# Patient Record
Sex: Male | Born: 1950
Health system: Southern US, Community
[De-identification: ages and names within clinical notes are randomized; demographics above are authoritative.]

## PROBLEM LIST (undated history)

## (undated) DIAGNOSIS — L301 Dyshidrosis [pompholyx]: Secondary | ICD-10-CM

## (undated) DIAGNOSIS — Z8719 Personal history of other diseases of the digestive system: Secondary | ICD-10-CM

## (undated) DIAGNOSIS — T753XXA Motion sickness, initial encounter: Secondary | ICD-10-CM

## (undated) DIAGNOSIS — N183 Chronic kidney disease, stage 3 unspecified: Secondary | ICD-10-CM

## (undated) DIAGNOSIS — N189 Chronic kidney disease, unspecified: Secondary | ICD-10-CM

## (undated) DIAGNOSIS — Z972 Presence of dental prosthetic device (complete) (partial): Secondary | ICD-10-CM

## (undated) DIAGNOSIS — B0229 Other postherpetic nervous system involvement: Secondary | ICD-10-CM

## (undated) DIAGNOSIS — K7689 Other specified diseases of liver: Secondary | ICD-10-CM

## (undated) DIAGNOSIS — I10 Essential (primary) hypertension: Secondary | ICD-10-CM

## (undated) DIAGNOSIS — H269 Unspecified cataract: Secondary | ICD-10-CM

## (undated) DIAGNOSIS — I25110 Atherosclerotic heart disease of native coronary artery with unstable angina pectoris: Secondary | ICD-10-CM

## (undated) DIAGNOSIS — Z87442 Personal history of urinary calculi: Secondary | ICD-10-CM

## (undated) DIAGNOSIS — R7303 Prediabetes: Secondary | ICD-10-CM

## (undated) DIAGNOSIS — N400 Enlarged prostate without lower urinary tract symptoms: Secondary | ICD-10-CM

## (undated) DIAGNOSIS — I340 Nonrheumatic mitral (valve) insufficiency: Secondary | ICD-10-CM

## (undated) DIAGNOSIS — I700 Atherosclerosis of aorta: Secondary | ICD-10-CM

## (undated) DIAGNOSIS — I5189 Other ill-defined heart diseases: Secondary | ICD-10-CM

## (undated) DIAGNOSIS — R001 Bradycardia, unspecified: Secondary | ICD-10-CM

## (undated) DIAGNOSIS — M109 Gout, unspecified: Secondary | ICD-10-CM

## (undated) DIAGNOSIS — C4490 Unspecified malignant neoplasm of skin, unspecified: Secondary | ICD-10-CM

## (undated) DIAGNOSIS — I351 Nonrheumatic aortic (valve) insufficiency: Secondary | ICD-10-CM

## (undated) DIAGNOSIS — K469 Unspecified abdominal hernia without obstruction or gangrene: Secondary | ICD-10-CM

## (undated) DIAGNOSIS — K5730 Diverticulosis of large intestine without perforation or abscess without bleeding: Secondary | ICD-10-CM

## (undated) DIAGNOSIS — K219 Gastro-esophageal reflux disease without esophagitis: Secondary | ICD-10-CM

## (undated) DIAGNOSIS — Z955 Presence of coronary angioplasty implant and graft: Secondary | ICD-10-CM

## (undated) DIAGNOSIS — E785 Hyperlipidemia, unspecified: Secondary | ICD-10-CM

## (undated) DIAGNOSIS — I4510 Unspecified right bundle-branch block: Secondary | ICD-10-CM

## (undated) DIAGNOSIS — I2510 Atherosclerotic heart disease of native coronary artery without angina pectoris: Secondary | ICD-10-CM

## (undated) DIAGNOSIS — N200 Calculus of kidney: Secondary | ICD-10-CM

## (undated) DIAGNOSIS — I219 Acute myocardial infarction, unspecified: Secondary | ICD-10-CM

## (undated) HISTORY — DX: Unspecified cataract: H26.9

## (undated) HISTORY — DX: Calculus of kidney: N20.0

## (undated) HISTORY — DX: Hyperlipidemia, unspecified: E78.5

## (undated) HISTORY — DX: Chronic kidney disease, stage 3 unspecified: N18.30

## (undated) HISTORY — DX: Atherosclerotic heart disease of native coronary artery without angina pectoris: I25.10

## (undated) HISTORY — DX: Unspecified right bundle-branch block: I45.10

## (undated) HISTORY — DX: Other ill-defined heart diseases: I51.89

## (undated) HISTORY — DX: Essential (primary) hypertension: I10

## (undated) HISTORY — DX: Unspecified abdominal hernia without obstruction or gangrene: K46.9

## (undated) HISTORY — DX: Gastro-esophageal reflux disease without esophagitis: K21.9

## (undated) HISTORY — PX: HERNIA REPAIR: SHX51

## (undated) HISTORY — DX: Chronic kidney disease, stage 3 (moderate): N18.3

---

## 2011-03-08 DIAGNOSIS — I219 Acute myocardial infarction, unspecified: Secondary | ICD-10-CM

## 2011-03-08 HISTORY — PX: CORONARY ANGIOPLASTY WITH STENT PLACEMENT: SHX49

## 2011-03-08 HISTORY — DX: Acute myocardial infarction, unspecified: I21.9

## 2011-03-08 HISTORY — PX: COLONOSCOPY: SHX174

## 2011-04-08 DIAGNOSIS — I2119 ST elevation (STEMI) myocardial infarction involving other coronary artery of inferior wall: Secondary | ICD-10-CM

## 2011-04-08 HISTORY — DX: ST elevation (STEMI) myocardial infarction involving other coronary artery of inferior wall: I21.19

## 2012-03-07 HISTORY — PX: LITHOTRIPSY: SUR834

## 2013-11-05 DIAGNOSIS — M109 Gout, unspecified: Secondary | ICD-10-CM | POA: Insufficient documentation

## 2013-11-05 DIAGNOSIS — M546 Pain in thoracic spine: Secondary | ICD-10-CM | POA: Insufficient documentation

## 2014-09-04 DIAGNOSIS — I10 Essential (primary) hypertension: Secondary | ICD-10-CM | POA: Insufficient documentation

## 2014-09-04 DIAGNOSIS — E782 Mixed hyperlipidemia: Secondary | ICD-10-CM | POA: Insufficient documentation

## 2015-02-23 ENCOUNTER — Encounter: Payer: Self-pay | Admitting: Emergency Medicine

## 2015-02-23 ENCOUNTER — Emergency Department
Admission: EM | Admit: 2015-02-23 | Discharge: 2015-02-23 | Disposition: A | Discharge status: Home / Self Care | Payer: Self-pay | Attending: Emergency Medicine | Admitting: Emergency Medicine

## 2015-02-23 DIAGNOSIS — T18108A Unspecified foreign body in esophagus causing other injury, initial encounter: Secondary | ICD-10-CM

## 2015-02-23 DIAGNOSIS — Y9289 Other specified places as the place of occurrence of the external cause: Secondary | ICD-10-CM | POA: Insufficient documentation

## 2015-02-23 DIAGNOSIS — T18128A Food in esophagus causing other injury, initial encounter: Secondary | ICD-10-CM | POA: Insufficient documentation

## 2015-02-23 DIAGNOSIS — X58XXXA Exposure to other specified factors, initial encounter: Secondary | ICD-10-CM | POA: Insufficient documentation

## 2015-02-23 DIAGNOSIS — Y998 Other external cause status: Secondary | ICD-10-CM | POA: Insufficient documentation

## 2015-02-23 DIAGNOSIS — Y9389 Activity, other specified: Secondary | ICD-10-CM | POA: Insufficient documentation

## 2015-02-23 HISTORY — DX: Acute myocardial infarction, unspecified: I21.9

## 2015-02-23 MED ORDER — ONDANSETRON HCL 4 MG/2ML IJ SOLN
4.0000 mg | Freq: Once | INTRAMUSCULAR | Status: AC
  Administered 2015-02-23: 4 mg via INTRAVENOUS
  Filled 2015-02-23: qty 2

## 2015-02-23 MED ORDER — GLUCAGON HCL (RDNA) 1 MG IJ SOLR
1.0000 mg | Freq: Once | INTRAMUSCULAR | Status: AC
  Administered 2015-02-23: 1 mg via INTRAVENOUS
  Filled 2015-02-23 (×2): qty 1

## 2015-02-23 NOTE — ED Notes (Signed)
Reports getting a piece of chicken is stuck in his throat.  Pt able to talk well but frquent spitting due to difficulty swallowing

## 2015-02-23 NOTE — ED Provider Notes (Addendum)
[facility] Emergency Department Provider Note  ____________________________________________   I have reviewed the triage vital signs and the nursing notes.   HISTORY  Chief Complaint Choking    HPI Kevin Melton is a 64 y.o. male with a history of meat impaction in the past was eating a piece of chicken, he felt it go down and get stuck in his throat. He has mild discomfort in that exact spot in the lower chest, he states when he swallows his saliva it comes right back up. He has never had an endoscopy or further workup for this but he does eat small pieces usually because he notes that this can happen to him. The last time this happened he had glucagon and felt better.  Past Medical History  Diagnosis Date  . MI (myocardial infarction) ([place])     There are no active problems to display for this patient.   Past Surgical History  Procedure Laterality Date  . Coronary angioplasty with stent placement      No current outpatient prescriptions on file.  Allergies Review of patient's allergies indicates no known allergies.  History reviewed. No pertinent family history.  Social History Social History  Substance Use Topics  . Smoking status: Never Smoker   . Smokeless tobacco: None  . Alcohol Use: None    Review of Systems Constitutional: No fever/chills Eyes: No visual changes. ENT: No sore throat. No stiff neck no neck pain Cardiovascular: Denies chest pain. see history of present illness Respiratory: Denies shortness of breath. Gastrointestinal:   no vomiting.  No diarrhea.  No constipation. Genitourinary: Negative for dysuria. Musculoskeletal: Negative lower extremity swelling Skin: Negative for rash. Neurological: Negative for headaches, focal weakness or numbness. 10-point ROS otherwise negative.  ____________________________________________   PHYSICAL EXAM:  VITAL SIGNS: ED Triage Vitals  Enc Vitals Group     BP --       Pulse Rate 02/23/15 1854 51     Resp 02/23/15 1854 20     Temp 02/23/15 1854 98 F (36.7 C)     Temp Source 02/23/15 1854 Oral     SpO2 02/23/15 1854 98 %     Weight 02/23/15 1854 195 lb (88.451 kg)     Height 02/23/15 1854 5\' 9"  (1.753 m)     Head Cir --      Peak Flow --      Pain Score --      Pain Loc --      Pain Edu? --      Excl. in [place]? --     Constitutional: Alert and oriented. Well appearing and in no acute distress. Eyes: Conjunctivae are normal. PERRL. EOMI. Head: Atraumatic. Nose: No congestion/rhinnorhea. Mouth/Throat: Mucous membranes are moist.  Oropharynx non-erythematous. Neck: No stridor.   Nontender with no meningismus Cardiovascular: Normal rate, regular rhythm. Grossly normal heart sounds.  Good peripheral circulation. Respiratory: Normal respiratory effort.  No retractions. Lungs CTAB. Abdominal: Soft and nontender. No distention. No guarding no rebound Back:  There is no focal tenderness or step off there is no midline tenderness there are no lesions noted. there is no CVA tenderness Musculoskeletal: No lower extremity tenderness. No joint effusions, no DVT signs strong distal pulses no edema Neurologic:  Normal speech and language. No gross focal neurologic deficits are appreciated.  Skin:  Skin is warm, dry and intact. No rash noted. Psychiatric: Mood and affect are normal. Speech and behavior are normal.  ____________________________________________   LABS (all labs ordered  are listed, but only abnormal results are displayed)  Labs Reviewed - No data to display ____________________________________________  EKG  I personally interpreted any EKGs ordered by me or triage Sinus bradycardia and no acute ST elevation or depression noted acute ST changes ____________________________________________  RADIOLOGY  I reviewed any imaging ordered by me or triage that were performed during my  shift ____________________________________________   PROCEDURES  Procedure(s) performed: None  Critical Care performed: None  ____________________________________________   INITIAL IMPRESSION / ASSESSMENT AND PLAN / ED COURSE  Pertinent labs & imaging results that were available during my care of the patient were reviewed by me and considered in my medical decision making (see chart for details).  The patient presents today with a meat impaction he is very well-appearing. Do not feel this likely represents PE ACS or dissection or other intrathoracic pathology. No evidence of rupture. We will give him glucagon and see if this helps him feel better. ____________________________________________  ----------------------------------------- 7:54 PM on 02/23/2015 -----------------------------------------  Discussed with on-call GI Dr. Gustavo Lah who graciously agrees to consult, he agrees with plan and management and will check back to see if the glucagon functions.  ----------------------------------------- 9:17 PM on 02/23/2015 -----------------------------------------  Patient states he feels 100% better and that the impaction has gone he felt that slide into his stomach he is able to drink water he is handling his secretions. I'm giving him some crackers and if he can tolerate that we will discharge him.   FINAL CLINICAL IMPRESSION(S) / ED DIAGNOSES  Final diagnoses:  None     Schuyler Amor, MD 02/23/15 [place], MD 02/23/15 [place], MD 02/23/15 [id number]  Schuyler Amor, MD 02/23/15 [id number]

## 2016-11-10 ENCOUNTER — Encounter: Payer: Self-pay | Admitting: Family Medicine

## 2016-11-10 ENCOUNTER — Ambulatory Visit (INDEPENDENT_AMBULATORY_CARE_PROVIDER_SITE_OTHER): Payer: PPO | Admitting: Family Medicine

## 2016-11-10 VITALS — BP 156/74 | HR 68 | Temp 98.0°F | Resp 16 | Ht 69.0 in | Wt 205.0 lb

## 2016-11-10 DIAGNOSIS — R1310 Dysphagia, unspecified: Secondary | ICD-10-CM

## 2016-11-10 DIAGNOSIS — Z23 Encounter for immunization: Secondary | ICD-10-CM | POA: Diagnosis not present

## 2016-11-10 DIAGNOSIS — K429 Umbilical hernia without obstruction or gangrene: Secondary | ICD-10-CM

## 2016-11-10 DIAGNOSIS — Z1159 Encounter for screening for other viral diseases: Secondary | ICD-10-CM | POA: Diagnosis not present

## 2016-11-10 DIAGNOSIS — I252 Old myocardial infarction: Secondary | ICD-10-CM | POA: Diagnosis not present

## 2016-11-10 DIAGNOSIS — Z114 Encounter for screening for human immunodeficiency virus [HIV]: Secondary | ICD-10-CM | POA: Diagnosis not present

## 2016-11-10 DIAGNOSIS — Z7689 Persons encountering health services in other specified circumstances: Secondary | ICD-10-CM | POA: Diagnosis not present

## 2016-11-10 NOTE — Patient Instructions (Signed)

## 2016-11-10 NOTE — Addendum Note (Signed)
Addended by: Brennan Bailey on: 11/10/2016 04:59 PM   Modules accepted: Orders

## 2016-11-10 NOTE — Assessment & Plan Note (Signed)
Not taking BB, but states he did not tolerate low dose in the past due to bradycardia Is hypertensive today, but thinks this is related to anxiety over establishing care as it is usually well controlled at home No medication changes today Check CMP, CBC, lipid panel Referral to Cardiology Recheck BP at next visit

## 2016-11-10 NOTE — Progress Notes (Signed)
Name: Kevin Melton   MRN: [id number]    DOB: 04-21-1950   Date:11/10/2016       Progress Note  Subjective  Chief Complaint  Chief Complaint  Patient presents with  . New Patient (Initial Visit)    HPI  Kevin Melton presents today to establish care. He reports he has not received care in the last 5 years, and he needs a new a PCP because he is now on Medicare. He needs to discuss 2 problems, including a hernia and dysphagia. Pt reports the hernia is located on the umbilicus for the last 4-5 years, and is increasing in size. Pt denies pain.  Wonders if this is related to his GERD and dysphagia  He is also c/o "food getting stuck in my throat" for about 2 years now. He admits to acid reflux. He states he has several family members who had to get esophagus stretched, including his mother, sister and brother. Has been seen in the emergency room for this previously.  Cuts his food into very small pieces to avoid this, but still has some pain with swallowing.  Takes ranitidine prn, not daily.   H/o MI: in 2013, s/p 1 stent placement - not taking a beta blocker due to bradycardia - is taking a baby aspirin daily and statin and ACEi - no CP, SOB, exercise restriction    H/O acute myocardial infarction Not taking BB, but states he did not tolerate low dose in the past due to bradycardia Is hypertensive today, but thinks this is related to anxiety over establishing care as it is usually well controlled at home No medication changes today Check CMP, CBC, lipid panel Referral to Cardiology Recheck BP at next visit  Dysphagia Concerning that patient needs EGD and possible esophageal dilation Referral to GI for management  Umbilical hernia Small and easily reducible Referral to gen surg to discuss options for possible surgery Discussed warning signs for obstruction and strangulation that would require emergent medical attention   Past Medical History:  Diagnosis Date  . Kidney  stones   . MI (myocardial infarction) ([place]) 2013   1 stent placed    Past Surgical History:  Procedure Laterality Date  . CORONARY ANGIOPLASTY WITH STENT PLACEMENT  2013  . LITHOTRIPSY  2014    Family History  Problem Relation Age of Onset  . Dementia Mother 18  . Lung disease Father 69       collapsed lung  . Healthy Sister   . Healthy Brother   . Healthy Son   . Healthy Sister   . Colon cancer Neg Hx   . Prostate cancer Neg Hx     Social History   Social History  . Marital status: Married    Spouse name: [name]  . Number of children: 1  . Years of education: 41   Occupational History  . Self Employed     [employer] [place] History Main Topics  . Smoking status: Never Smoker  . Smokeless tobacco: Never Used  . Alcohol use No  . Drug use: No  . Sexual activity: Yes    Partners: Female   Other Topics Concern  . Not on file   Social History Narrative  . No narrative on file     Current Outpatient Prescriptions:  .  allopurinol (ZYLOPRIM) 300 MG tablet, Take by mouth., Disp: , Rfl:  .  aspirin 81 MG chewable tablet, Chew by mouth., Disp: , Rfl:  .  benazepril (LOTENSIN) 5 MG tablet, Take by mouth., Disp: , Rfl:  .  nitroGLYCERIN (NITROSTAT) 0.4 MG SL tablet, Place 1 tablet under the tongue as needed., Disp: , Rfl:  .  pravastatin (PRAVACHOL) 40 MG tablet, Take by mouth., Disp: , Rfl:   No Known Allergies   Review of Systems  Constitutional: Negative.   HENT: Negative.   Eyes: Negative.   Respiratory: Negative.   Cardiovascular: Negative.   Gastrointestinal: Negative.   Genitourinary: Negative.   Musculoskeletal: Negative.   Skin: Negative.   Neurological: Negative.   Endo/Heme/Allergies: Negative.   Psychiatric/Behavioral: Negative.     Objective  Vitals:   11/10/16 1524  BP: (!) 156/74  Pulse: 68  Resp: 16  Temp: 98 F (36.7 C)  TempSrc: Oral  Weight: 205 lb (93 kg)  Height: 5\' 9"  (1.753 m)    Physical Exam   Constitutional: He is oriented to person, place, and time and well-developed, well-nourished, and in no distress.  HENT:  Head: Normocephalic and atraumatic.  Right Ear: External ear normal.  Left Ear: External ear normal.  Nose: Nose normal.  Mouth/Throat: Oropharynx is clear and moist.  Eyes: Pupils are equal, round, and reactive to light. Conjunctivae are normal. No scleral icterus.  Neck: Neck supple. No thyromegaly present.  Cardiovascular: Normal rate, regular rhythm, normal heart sounds and intact distal pulses.   No murmur heard. Pulmonary/Chest: Effort normal and breath sounds normal. No respiratory distress. He has no wheezes. He has no rales.  Abdominal: Bowel sounds are normal. He exhibits no distension. There is no tenderness. There is no rebound and no guarding.  + umbilical hernia, easily reducible  Musculoskeletal: He exhibits no edema or deformity.  Lymphadenopathy:    He has no cervical adenopathy.  Neurological: He is alert and oriented to person, place, and time. Gait normal.  Skin: Skin is warm and dry. No rash noted.  Psychiatric: Affect and judgment normal.  Vitals reviewed.    No results found for this or any previous visit (from the past 2160 hour(s)).   Assessment & Plan  Problem List Items Addressed This Visit      Digestive   Dysphagia    Concerning that patient needs EGD and possible esophageal dilation Referral to GI for management      Relevant Orders   Ambulatory referral to Gastroenterology     Other   H/O acute myocardial infarction    Not taking BB, but states he did not tolerate low dose in the past due to bradycardia Is hypertensive today, but thinks this is related to anxiety over establishing care as it is usually well controlled at home No medication changes today Check CMP, CBC, lipid panel Referral to Cardiology Recheck BP at next visit      Relevant Orders   Lipid panel   Comprehensive metabolic panel   CBC  w/Diff/Platelet   Ambulatory referral to Cardiology   Umbilical hernia    Small and easily reducible Referral to gen surg to discuss options for possible surgery Discussed warning signs for obstruction and strangulation that would require emergent medical attention      Relevant Orders   Ambulatory referral to General Surgery    Other Visit Diagnoses    Encounter to establish care    -  Primary   Need for hepatitis C screening test       Relevant Orders   Hepatitis C Antibody   Screening for HIV (human immunodeficiency virus)  Relevant Orders   HIV antibody (with reflex)     Flu shot and pneumovax given today  Meds ordered this encounter  Medications  . benazepril (LOTENSIN) 5 MG tablet    Sig: Take by mouth.  . pravastatin (PRAVACHOL) 40 MG tablet    Sig: Take by mouth.  Marland Kitchen aspirin 81 MG chewable tablet    Sig: Chew by mouth.  Marland Kitchen allopurinol (ZYLOPRIM) 300 MG tablet    Sig: Take by mouth.  . nitroGLYCERIN (NITROSTAT) 0.4 MG SL tablet    Sig: Place 1 tablet under the tongue as needed.    Return in about 3 months (around 02/09/2017) for welcome to medicare.   The entirety of the information documented in the History of Present Illness, Review of Systems and Physical Exam were personally obtained by me. Portions of this information were initially documented by Joseline Rosas and Raquel Sarna Ratchford, CMA and reviewed by me for thoroughness and accuracy.    Virginia Crews, MD, MPH [facility] 11/10/2016 4:23 PM

## 2016-11-10 NOTE — Assessment & Plan Note (Signed)
Concerning that patient needs EGD and possible esophageal dilation Referral to GI for management

## 2016-11-10 NOTE — Assessment & Plan Note (Signed)
Small and easily reducible Referral to gen surg to discuss options for possible surgery Discussed warning signs for obstruction and strangulation that would require emergent medical attention

## 2016-11-11 ENCOUNTER — Encounter: Payer: Self-pay | Admitting: General Surgery

## 2016-11-11 LAB — COMPREHENSIVE METABOLIC PANEL
AG RATIO: 1.4 (calc) (ref 1.0–2.5)
ALT: 18 U/L (ref 9–46)
AST: 14 U/L (ref 10–35)
Albumin: 4.2 g/dL (ref 3.6–5.1)
Alkaline phosphatase (APISO): 56 U/L (ref 40–115)
BILIRUBIN TOTAL: 0.4 mg/dL (ref 0.2–1.2)
BUN / CREAT RATIO: 11 (calc) (ref 6–22)
BUN: 16 mg/dL (ref 7–25)
CALCIUM: 9.5 mg/dL (ref 8.6–10.3)
CHLORIDE: 105 mmol/L (ref 98–110)
CO2: 27 mmol/L (ref 20–32)
Creat: 1.44 mg/dL — ABNORMAL HIGH (ref 0.70–1.25)
GLOBULIN: 3 g/dL (ref 1.9–3.7)
GLUCOSE: 103 mg/dL (ref 65–139)
Potassium: 4.6 mmol/L (ref 3.5–5.3)
SODIUM: 139 mmol/L (ref 135–146)
TOTAL PROTEIN: 7.2 g/dL (ref 6.1–8.1)

## 2016-11-11 LAB — LIPID PANEL
CHOLESTEROL: 159 mg/dL (ref <200)
HDL: 39 mg/dL — ABNORMAL LOW (ref >40)
LDL Cholesterol (Calc): 90 mg/dL (calc)
Non-HDL Cholesterol (Calc): 120 mg/dL (calc) (ref <130)
TRIGLYCERIDES: 199 mg/dL — AB (ref <150)
Total CHOL/HDL Ratio: 4.1 (calc) (ref <5.0)

## 2016-11-11 LAB — CBC WITH DIFFERENTIAL/PLATELET
BASOS ABS: 67 {cells}/uL (ref 0–200)
Basophils Relative: 0.7 %
EOS ABS: 624 {cells}/uL — AB (ref 15–500)
Eosinophils Relative: 6.5 %
HCT: 47.3 % (ref 38.5–50.0)
Hemoglobin: 16.2 g/dL (ref 13.2–17.1)
Lymphs Abs: 2477 cells/uL (ref 850–3900)
MCH: 30.1 pg (ref 27.0–33.0)
MCHC: 34.2 g/dL (ref 32.0–36.0)
MCV: 87.8 fL (ref 80.0–100.0)
MONOS PCT: 9.5 %
MPV: 11.4 fL (ref 7.5–12.5)
Neutro Abs: 5520 cells/uL (ref 1500–7800)
Neutrophils Relative %: 57.5 %
PLATELETS: 225 10*3/uL (ref 140–400)
RBC: 5.39 10*6/uL (ref 4.20–5.80)
RDW: 13.5 % (ref 11.0–15.0)
TOTAL LYMPHOCYTE: 25.8 %
WBC mixed population: 912 cells/uL (ref 200–950)
WBC: 9.6 10*3/uL (ref 3.8–10.8)

## 2016-11-11 LAB — HEPATITIS C ANTIBODY
Hepatitis C Ab: NONREACTIVE
SIGNAL TO CUT-OFF: 0.04 (ref <1.00)

## 2016-11-11 LAB — HIV ANTIBODY (ROUTINE TESTING W REFLEX): HIV 1&2 Ab, 4th Generation: NONREACTIVE

## 2016-11-14 ENCOUNTER — Telehealth: Payer: Self-pay

## 2016-11-14 DIAGNOSIS — N289 Disorder of kidney and ureter, unspecified: Secondary | ICD-10-CM

## 2016-11-14 NOTE — Telephone Encounter (Signed)
-----   Message from Virginia Crews, MD sent at 11/11/2016  3:37 PM EDT ----- Cholesterol is good.  Good cholesterol is low and exercise can improve this.  Normal liver function, electrolytes.  HIV and Hep C screen negative.  Creatinine (a marker of kidney function) is high.  This could be a chronic problem but we have no labs to compare to.  This can sometimes be seen from drugs like Benazepril.  I recommend holding this medication for 2 weeks and then having the kidney function rechecked (please order renal function panel with GFR for 2 weeks from now).  Blood counts are normal, but one type of WBCs is high (eosinophils).  This can be seen in some infections and will often resolve itself. We will recheck in 3 months.  Virginia Crews, MD, MPH [facility] 11/11/2016 3:37 PM

## 2016-11-14 NOTE — Telephone Encounter (Signed)
lmtcb

## 2016-11-15 ENCOUNTER — Encounter: Payer: Self-pay | Admitting: *Deleted

## 2016-11-21 ENCOUNTER — Ambulatory Visit: Payer: Self-pay | Admitting: Gastroenterology

## 2016-11-21 ENCOUNTER — Ambulatory Visit: Payer: Self-pay | Admitting: General Surgery

## 2016-11-22 NOTE — Telephone Encounter (Signed)
lmtcb-Calinda Stockinger V Manasvi Dickard, RMA  

## 2016-11-24 NOTE — Telephone Encounter (Signed)
Pt advised. He states he has had problems with his kidneys in the past. He was once told that "one kidney is working at 70% and the other kidney is at 30%". He had issues with kidney stones in the past, as well as a kidney infection as a child. Advised to D/C medication for 2 weeks the recheck labs to see if kidney function is unchanged. He agrees with treatment plan.

## 2016-12-13 DIAGNOSIS — N289 Disorder of kidney and ureter, unspecified: Secondary | ICD-10-CM | POA: Diagnosis not present

## 2016-12-13 LAB — RENAL PROFILE WITH ESTIMATED GFR
ALBUMIN MSPROF: 4.1 g/dL (ref 3.6–5.1)
BUN / CREAT RATIO: 14 (calc) (ref 6–22)
BUN: 19 mg/dL (ref 7–25)
CHLORIDE: 106 mmol/L (ref 98–110)
CO2: 26 mmol/L (ref 20–32)
Calcium: 9.1 mg/dL (ref 8.6–10.3)
Creat: 1.36 mg/dL — ABNORMAL HIGH (ref 0.70–1.25)
GFR, EST AFRICAN AMERICAN: 63 mL/min/{1.73_m2} (ref >=60)
GFR, Est Non African American: 54 mL/min/{1.73_m2} — ABNORMAL LOW (ref >=60)
Glucose, Bld: 83 mg/dL (ref 65–99)
Phosphorus: 3.1 mg/dL (ref 2.1–4.3)
Potassium: 4.2 mmol/L (ref 3.5–5.3)
Sodium: 140 mmol/L (ref 135–146)

## 2016-12-14 ENCOUNTER — Telehealth: Payer: Self-pay

## 2016-12-14 NOTE — Telephone Encounter (Signed)
-----   Message from Virginia Crews, MD sent at 12/14/2016  9:34 AM EDT ----- Creatinine (kidney function) is only very slightly better.  It is likely that this is a chronic problem.  Can resume benazepril.  Be sure to stay hydrated.  We will check again at next follow-up.  Virginia Crews, MD, MPH [facility] 12/14/2016 9:34 AM

## 2016-12-14 NOTE — Telephone Encounter (Signed)
lmtcb

## 2016-12-20 NOTE — Telephone Encounter (Signed)
Called but not able to leave a message due to mail box is full.-Trevia Nop V Cledis Sohn, RMA

## 2016-12-22 ENCOUNTER — Telehealth: Payer: Self-pay

## 2016-12-22 ENCOUNTER — Encounter: Payer: Self-pay | Admitting: Gastroenterology

## 2016-12-22 ENCOUNTER — Ambulatory Visit (INDEPENDENT_AMBULATORY_CARE_PROVIDER_SITE_OTHER): Payer: PPO | Admitting: Gastroenterology

## 2016-12-22 ENCOUNTER — Other Ambulatory Visit: Payer: Self-pay

## 2016-12-22 ENCOUNTER — Encounter (INDEPENDENT_AMBULATORY_CARE_PROVIDER_SITE_OTHER): Payer: Self-pay

## 2016-12-22 VITALS — BP 148/80 | HR 71 | Temp 98.2°F | Ht 69.0 in | Wt 202.8 lb

## 2016-12-22 DIAGNOSIS — R1319 Other dysphagia: Secondary | ICD-10-CM

## 2016-12-22 DIAGNOSIS — K219 Gastro-esophageal reflux disease without esophagitis: Secondary | ICD-10-CM

## 2016-12-22 DIAGNOSIS — R1310 Dysphagia, unspecified: Secondary | ICD-10-CM

## 2016-12-22 DIAGNOSIS — R12 Heartburn: Secondary | ICD-10-CM | POA: Diagnosis not present

## 2016-12-22 MED ORDER — RANITIDINE HCL 150 MG PO CAPS: 150.0000 mg | ORAL_CAPSULE | Freq: Two times a day (BID) | ORAL | 0 refills | Status: DC

## 2016-12-22 NOTE — Telephone Encounter (Signed)
    Patient has been informed of rx for Zantac and informed him its cheaper compared to over-the-counter  He's at pharmacy getting it now and appreciates the call. Thanks [employer]

## 2016-12-22 NOTE — Progress Notes (Signed)
Kevin Darby, MD [address]  [place]  [place], [place] [address]  Main: [phone]  Fax: [phone]    Gastroenterology Consultation  Referring Provider:     Virginia Crews, MD Primary Care Physician:  Virginia Crews, MD Primary Gastroenterologist:  Dr. Cephas Melton Reason for Consultation:     Chronic dysphagia        HPI:   Kevin Melton is a 66 y.o. male referred by Dr. Brita Romp, Dionne Bucy, MD  for consultation & management of chronic dysphagia to solids. He experienced food impaction in 2016, went to ER, food bolus spontaneously passed after glucagon and soda.  Another epsidoe 43months later, went to ER again Did not have EGD during both episodes. Since then, he stayed away from hard foods due to dysphagia He is very careful of what he is eating, did not see GI until now due to lack of insurance Currently, having nocturnal heart burn if he has late dinner Currently not on PPI or H2 blocker He has mildly elevated serum creatinine, was thought to be secondary to ACE inhibitor and also, he had history of kidney stones. We do not have his baseline creatinine He does not smoke, or drink alcohol, denies NSAID use He is a Geophysicist/field seismologist by profession He denies any family history of GI malignancies GI Procedures: had a colonoscopy at age 44 and at age 62, reportedly normal  Past Medical History:  Diagnosis Date  . Abdominal hernia    umbilical  . Kidney stones   . MI (myocardial infarction) ([place]) 2013   1 stent placed    Past Surgical History:  Procedure Laterality Date  . CORONARY ANGIOPLASTY WITH STENT PLACEMENT  2013  . LITHOTRIPSY  2014    Prior to Admission medications   Medication Sig Start Date End Date Taking? Authorizing Provider  allopurinol (ZYLOPRIM) 300 MG tablet Take by mouth. 03/21/16  Yes [provider]  aspirin 81 MG chewable tablet Chew by mouth.   Yes [provider]  pravastatin (PRAVACHOL) 40 MG  tablet Take by mouth. 03/21/16  Yes [provider]    Family History  Problem Relation Age of Onset  . Dementia Mother 40  . Lung disease Father 83       collapsed lung  . Healthy Sister   . Healthy Brother   . Healthy Son   . Healthy Sister   . Colon cancer Neg Hx   . Prostate cancer Neg Hx      Social History  Substance Use Topics  . Smoking status: Never Smoker  . Smokeless tobacco: Never Used  . Alcohol use No    Allergies as of 12/22/2016  . (No Known Allergies)    Review of Systems:    All systems reviewed and negative except where noted in HPI.   Physical Exam:  BP (!) 148/80   Pulse 71   Temp 98.2 F (36.8 C) (Oral)   Ht 5\' 9"  (1.753 m)   Wt 202 lb 12.8 oz (92 kg)   BMI 29.95 kg/m  No LMP for male patient.  General:   Alert,  Well-developed, well-nourished, pleasant and cooperative in NAD Head:  Normocephalic and atraumatic. Eyes:  Sclera clear, no icterus.   Conjunctiva pink. Ears:  Normal auditory acuity. Nose:  No deformity, discharge, or lesions. Mouth:  No deformity or lesions,oropharynx pink & moist. Neck:  Supple; no masses or thyromegaly. Lungs:  Respirations even and unlabored.  Clear throughout to  auscultation.   No wheezes, crackles, or rhonchi. No acute distress. Heart:  Regular rate and rhythm; no murmurs, clicks, rubs, or gallops. Abdomen:  Normal bowel sounds. Soft, obese,non-tender and non-distended,   Medium size ventral hernia and umbilical hernia, nontender. No hepatosplenomegaly noted.  No guarding or rebound tenderness.   Rectal: Nor performed Msk:  Symmetrical without gross deformities. Good, equal movement & strength bilaterally. Pulses:  Normal pulses noted. Extremities:  No clubbing or edema.  No cyanosis. Neurologic:  Alert and oriented x3;  grossly normal neurologically. Skin:  Intact without significant lesions or rashes. No jaundice. Lymph Nodes:  No significant cervical adenopathy. Psych:  Alert and cooperative.  Normal mood and affect.  Imaging Studies: none  Assessment and Plan:   Kevin Melton is a 66 y.o. male with chronic dysphagia to solids, prior episodes of food impactions which spontaneously passed. Never had an EGD before. He also has Periumbilical and ventral hernias  Chronic dysphagia: peptic stricture or Schatzki's ring or malignancy or achalasia or esophageal web EGD with or without dilation I have discussed alternative options, risks & benefits,  which include, but are not limited to, bleeding, infection, perforation,respiratory complication & drug reaction.  The patient agrees with this plan & written consent will be obtained.    Given his elevated serum creatinine, recommend H2 blocker 1-2 times daily or at bedtime  He is also referred to general surgery for evaluation of hernia repair  Colonoscopy at age 52  Follow up after EGD   Kevin Darby, MD

## 2016-12-22 NOTE — Telephone Encounter (Signed)
-----   Message from Lin Landsman, MD sent at 12/22/2016  4:03 PM EDT ----- Regarding: prescription Please let him know that I sent a prescription for Zantac as it is cheaper been prescribed compared to over-the-counter  Thnx RV

## 2016-12-27 ENCOUNTER — Encounter: Payer: Self-pay | Admitting: *Deleted

## 2016-12-27 NOTE — Telephone Encounter (Signed)
lmtcb

## 2016-12-27 NOTE — Telephone Encounter (Signed)
Pt advised and agrees with treatment plan. 

## 2016-12-27 NOTE — Telephone Encounter (Signed)
Pt stated he was returning nurse call about his lab results. Please advise. Thanks TNP

## 2017-01-04 ENCOUNTER — Ambulatory Visit (INDEPENDENT_AMBULATORY_CARE_PROVIDER_SITE_OTHER): Payer: PPO | Admitting: General Surgery

## 2017-01-04 ENCOUNTER — Encounter: Payer: Self-pay | Admitting: General Surgery

## 2017-01-04 VITALS — BP 132/84 | HR 69 | Ht 69.0 in | Wt 205.0 lb

## 2017-01-04 DIAGNOSIS — K429 Umbilical hernia without obstruction or gangrene: Secondary | ICD-10-CM

## 2017-01-04 NOTE — Patient Instructions (Signed)

## 2017-01-04 NOTE — Progress Notes (Signed)
Patient ID: Kevin Melton, male   DOB: [birth year], 66 y.o.   MRN: [id number]  Chief Complaint  Patient presents with  . Umbilical Hernia    HPI Kevin Melton is a 66 y.o. male.  Here for evaluation of an umbilical hernia referred by Dr Rene Kocher. He states he noticed an umbilical bulge for at least 4-5 years. He states it maybe getting some larger, but no pain. He is a Scientist, forensic. He states with long days carrying the heavy camera he might notice some discomfort.  He is here with his wife of 11 years, Kevin Melton.  HPI  Past Medical History:  Diagnosis Date  . Abdominal hernia    umbilical  . GERD (gastroesophageal reflux disease)   . Kidney stones   . MI (myocardial infarction) ([place]) 2013   1 stent placed    Past Surgical History:  Procedure Laterality Date  . COLONOSCOPY  2013  . CORONARY ANGIOPLASTY WITH STENT PLACEMENT  2013  . LITHOTRIPSY  2014    Family History  Problem Relation Age of Onset  . Dementia Mother 57  . Lung disease Father 41       collapsed lung  . Healthy Sister   . Healthy Brother   . Healthy Son   . Healthy Sister   . Colon cancer Neg Hx   . Prostate cancer Neg Hx     Social History Social History  Substance Use Topics  . Smoking status: Never Smoker  . Smokeless tobacco: Never Used  . Alcohol use No    No Known Allergies  Current Outpatient Prescriptions  Medication Sig Dispense Refill  . allopurinol (ZYLOPRIM) 300 MG tablet Take by mouth.    Marland Kitchen aspirin 81 MG chewable tablet Chew by mouth.    . benazepril (LOTENSIN) 5 MG tablet     . nitroGLYCERIN (NITROSTAT) 0.4 MG SL tablet Place under the tongue.    . pravastatin (PRAVACHOL) 40 MG tablet Take by mouth.     No current facility-administered medications for this visit.     Review of Systems Review of Systems  Constitutional: Negative.   Respiratory: Negative.   Cardiovascular: Negative.        No cardiac symptoms since stent placement in 2013.   Gastrointestinal: Negative for constipation and diarrhea.    Blood pressure 132/84, pulse 69, height 5\' 9"  (1.753 m), weight 205 lb (93 kg).  Physical Exam Physical Exam  Constitutional: He is oriented to person, place, and time. He appears well-developed and well-nourished.  HENT:  Mouth/Throat: Oropharynx is clear and moist.  Eyes: Conjunctivae are normal. No scleral icterus.  Neck: Neck supple.  Cardiovascular: Normal rate, regular rhythm and normal heart sounds.   Pulmonary/Chest: Effort normal and breath sounds normal.  Abdominal: A hernia is present.    2 cm facial defect, umbilical   Lymphadenopathy:    He has no cervical adenopathy.  Neurological: He is alert and oriented to person, place, and time.  Skin: Skin is warm and dry.  Psychiatric: His behavior is normal.    Data Reviewed Laboratory studies dated 10/13/2016 showed a creatinine of 1.36 with an estimated GFR 54. Normal electrolytes. CBC dated 11/10/2016 showed a hemoglobin of 16.2 with an MCV of 87.8, white blood cell count of 9600. Elevated eosinophil count. Platelet count 225,000. Creatinine same date was 1.44.  Assessment    Umbilical hernia with thinning of overlying skin.    Plan    Indications for elective repair reviewed. 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 Possible mesh repair discussed.     Hernia precautions and incarceration were discussed with the patient. If they develop symptoms of an incarcerated hernia, they were encouraged to seek prompt medical attention.  I have recommended repair of the hernia using mesh on an outpatient basis in the near future. The risk of infection was reviewed. The role of prosthetic mesh to minimize the risk of recurrence was reviewed.  HPI, Physical Exam, Assessment and Plan have been scribed under the direction and in the presence of Kevin Bellow, MD. Karie Fetch, RN  I have completed the exam and reviewed the  above documentation for accuracy and completeness.  I agree with the above.  Haematologist has been used and any errors in dictation or transcription are unintentional.  Hervey Ard, M.D., F.A.C.S. Kevin Melton 01/04/2017, 8:01 PM  Patient's surgery has been scheduled for 02-20-17 at [facility]. It is okay for patient to continue an 81 mg aspirin once daily.   Dominga Ferry, CMA

## 2017-01-05 ENCOUNTER — Ambulatory Visit
Admission: RE | Admit: 2017-01-05 | Discharge: 2017-01-05 | Disposition: A | Discharge status: Home / Self Care | Payer: PPO | Source: Ambulatory Visit | Attending: Gastroenterology | Admitting: Gastroenterology

## 2017-01-05 ENCOUNTER — Ambulatory Visit: Payer: PPO | Admitting: Anesthesiology

## 2017-01-05 ENCOUNTER — Encounter
Admission: RE | Disposition: A | Discharge status: Home / Self Care | Payer: Self-pay | Source: Ambulatory Visit | Attending: Gastroenterology

## 2017-01-05 DIAGNOSIS — K2210 Ulcer of esophagus without bleeding: Secondary | ICD-10-CM

## 2017-01-05 DIAGNOSIS — R1310 Dysphagia, unspecified: Secondary | ICD-10-CM | POA: Diagnosis not present

## 2017-01-05 DIAGNOSIS — K219 Gastro-esophageal reflux disease without esophagitis: Secondary | ICD-10-CM

## 2017-01-05 DIAGNOSIS — Z87442 Personal history of urinary calculi: Secondary | ICD-10-CM | POA: Insufficient documentation

## 2017-01-05 DIAGNOSIS — I252 Old myocardial infarction: Secondary | ICD-10-CM | POA: Diagnosis not present

## 2017-01-05 DIAGNOSIS — N189 Chronic kidney disease, unspecified: Secondary | ICD-10-CM | POA: Insufficient documentation

## 2017-01-05 DIAGNOSIS — Z79899 Other long term (current) drug therapy: Secondary | ICD-10-CM | POA: Diagnosis not present

## 2017-01-05 DIAGNOSIS — Z7982 Long term (current) use of aspirin: Secondary | ICD-10-CM | POA: Insufficient documentation

## 2017-01-05 DIAGNOSIS — K208 Other esophagitis: Secondary | ICD-10-CM | POA: Diagnosis not present

## 2017-01-05 DIAGNOSIS — R1314 Dysphagia, pharyngoesophageal phase: Secondary | ICD-10-CM | POA: Diagnosis present

## 2017-01-05 DIAGNOSIS — K209 Esophagitis, unspecified: Secondary | ICD-10-CM | POA: Diagnosis not present

## 2017-01-05 DIAGNOSIS — K210 Gastro-esophageal reflux disease with esophagitis: Secondary | ICD-10-CM | POA: Insufficient documentation

## 2017-01-05 DIAGNOSIS — R12 Heartburn: Secondary | ICD-10-CM | POA: Diagnosis not present

## 2017-01-05 HISTORY — DX: Chronic kidney disease, unspecified: N18.9

## 2017-01-05 HISTORY — PX: ESOPHAGOGASTRODUODENOSCOPY (EGD) WITH PROPOFOL: SHX5813

## 2017-01-05 SURGERY — ESOPHAGOGASTRODUODENOSCOPY (EGD) WITH PROPOFOL
Anesthesia: General

## 2017-01-05 MED ORDER — OMEPRAZOLE 40 MG PO CPDR: 40.0000 mg | DELAYED_RELEASE_CAPSULE | Freq: Two times a day (BID) | ORAL | 2 refills | Status: DC

## 2017-01-05 MED ORDER — PROPOFOL 500 MG/50ML IV EMUL
INTRAVENOUS | Status: DC | PRN
  Administered 2017-01-05: 100 ug/kg/min via INTRAVENOUS

## 2017-01-05 MED ORDER — SODIUM CHLORIDE 0.9 % IV SOLN
INTRAVENOUS | Status: DC
  Administered 2017-01-05 (×2): via INTRAVENOUS

## 2017-01-05 MED ORDER — PROPOFOL 10 MG/ML IV BOLUS
INTRAVENOUS | Status: DC | PRN
  Administered 2017-01-05 (×2): 50 mg via INTRAVENOUS

## 2017-01-05 MED ORDER — PROPOFOL 10 MG/ML IV BOLUS
INTRAVENOUS | Status: AC
  Filled 2017-01-05: qty 20

## 2017-01-05 NOTE — Anesthesia Post-op Follow-up Note (Signed)
Anesthesia QCDR form completed.        

## 2017-01-05 NOTE — Op Note (Signed)
[facility]astroenter[facility] Patient Name: Kevin Melton Procedure Date: 88/07/275 10:52 AM MRN: [id number] Account #: [id number] Date of Birth: 11-24-1950 Admit Type: Outpatient Age: 66 Room: [facility] ENDO ROOM 1 Gender: Male Note Status: Finalized Procedure:            Upper GI endoscopy Indications:          Esophageal dysphagia, Heartburn Providers:            Lin Landsman MD, MD Referring MD:         Dionne Bucy. Bacigalupo (Referring MD) Medicines:            Monitored Anesthesia Care Complications:        No immediate complications. Estimated blood loss:                        Minimal. Procedure:            Pre-Anesthesia Assessment:                       - Prior to the procedure, a History and Physical was                        performed, and patient medications and allergies were                        reviewed. The patient is competent. The risks and                        benefits of the procedure and the sedation options and                        risks were discussed with the patient. All questions                        were answered and informed consent was obtained.                        Patient identification and proposed procedure were                        verified by the physician, the nurse, the                        anesthesiologist, the anesthetist and the technician in                        the pre-procedure area in the procedure room. Mental                        Status Examination: alert and oriented. Airway                        Examination: normal oropharyngeal airway and neck                        mobility. Respiratory Examination: clear to                        auscultation. CV Examination: normal. Prophylactic  Antibiotics: The patient does not require prophylactic                        antibiotics. Prior Anticoagulants: The patient has                        taken no previous anticoagulant or  antiplatelet agents.                        ASA Grade Assessment: II - A patient with mild systemic                        disease. After reviewing the risks and benefits, the                        patient was deemed in satisfactory condition to undergo                        the procedure. The anesthesia plan was to use monitored                        anesthesia care (MAC). Immediately prior to                        administration of medications, the patient was                        re-assessed for adequacy to receive sedatives. The                        heart rate, respiratory rate, oxygen saturations, blood                        pressure, adequacy of pulmonary ventilation, and                        response to care were monitored throughout the                        procedure. The physical status of the patient was                        re-assessed after the procedure.                       After obtaining informed consent, the endoscope was                        passed under direct vision. Throughout the procedure,                        the patient's blood pressure, pulse, and oxygen                        saturations were monitored continuously. The                        Colonoscope was introduced through the mouth, and                        advanced  to the second part of duodenum. The upper GI                        endoscopy was accomplished without difficulty. The                        patient tolerated the procedure well. Findings:      The duodenal bulb and second portion of the duodenum were normal.      The entire examined stomach was normal.      The cardia and gastric fundus were normal on retroflexion.      Mucosal changes including ringed esophagus, feline appearance,       longitudinal furrows and congestion (edema) were found in the entire       esophagus. Esophageal findings were graded using the Eosinophilic       Esophagitis Endoscopic Reference Score  (EoE-EREFS) as: Edema Grade 1       Present (decreased clarity or absence of vascular markings), Rings Grade       2 Moderate (distinct rings that do not occlude passage of diagnostic       8-10 mm endoscope), Exudates Grade 0 None (no white lesions seen),       Furrows Grade 1 Present (vertical lines with or without visible depth)       and Stricture none (no stricture found). Biopsies were obtained from the       proximal and distal esophagus with cold forceps for histology of       suspected eosinophilic esophagitis.      LA Grade B (one or more mucosal breaks greater than 5 mm, not extending       between the tops of two mucosal folds) esophagitis with no bleeding was       found 39 cm from the incisors. Impression:           - Normal duodenal bulb and second portion of the                        duodenum.                       - Normal stomach.                       - Esophageal mucosal changes consistent with                        eosinophilic esophagitis. Biopsied.                       - LA Grade B reflux esophagitis. Recommendation:       - Discharge patient to home (with spouse).                       - Chopped diet and mechanical soft diet.                       - Continue present medications.                       - Use Prilosec (omeprazole) 40 mg PO BID for 3 months.                       -  Await pathology results. Procedure Code(s):    --- Professional ---                       [phone], Esophagogastroduodenoscopy, flexible, transoral;                        with biopsy, single or multiple Diagnosis Code(s):    --- Professional ---                       K21.0, Gastro-esophageal reflux disease with esophagitis                       R13.14, Dysphagia, pharyngoesophageal phase                       R12, Heartburn CPT copyright 2016 American Medical Association. All rights reserved. The codes documented in this report are preliminary and upon coder review may  be revised to  meet current compliance requirements. Attending Participation:      I personally performed the entire procedure. Dr. Ulyess Mort Lin Landsman MD, MD 01/05/2017 11:22:10 AM This report has been signed electronically. Number of Addenda: 0 Note Initiated On: 01/05/2017 10:52 AM      [facility]

## 2017-01-05 NOTE — Anesthesia Preprocedure Evaluation (Signed)
Anesthesia Evaluation  Patient identified by MRN, date of birth, ID band Patient awake    Reviewed: Allergy & Precautions, H&P , NPO status , Patient's Chart, lab work & pertinent test results, reviewed documented beta blocker date and time   History of Anesthesia Complications Negative for: history of anesthetic complications  Airway Mallampati: II  TM Distance: >3 FB Neck ROM: full    Dental  (+) Poor Dentition, Dental Advidsory Given, Missing   Pulmonary neg pulmonary ROS,           Cardiovascular Exercise Tolerance: Good hypertension, (-) angina+ CAD, + Past MI and + Cardiac Stents  (-) CABG (-) dysrhythmias (-) Valvular Problems/Murmurs     Neuro/Psych negative neurological ROS  negative psych ROS   GI/Hepatic Neg liver ROS, GERD  ,  Endo/Other  negative endocrine ROS  Renal/GU CRFRenal disease (kidney stones)  negative genitourinary   Musculoskeletal   Abdominal   Peds  Hematology negative hematology ROS (+)   Anesthesia Other Findings Past Medical History: No date: Abdominal hernia     Comment:  umbilical No date: Chronic kidney disease (CKD), active medical management  without dialysis No date: GERD (gastroesophageal reflux disease) No date: Kidney stones 2013: MI (myocardial infarction) ([place])     Comment:  1 stent placed   Reproductive/Obstetrics negative OB ROS                             Anesthesia Physical Anesthesia Plan  ASA: II  Anesthesia Plan: General   Post-op Pain Management:    Induction: Intravenous  PONV Risk Score and Plan: 2 and Propofol infusion  Airway Management Planned: Nasal Cannula  Additional Equipment:   Intra-op Plan:   Post-operative Plan:   Informed Consent: I have reviewed the patients History and Physical, chart, labs and discussed the procedure including the risks, benefits and alternatives for the proposed anesthesia with the  patient or authorized representative who has indicated his/her understanding and acceptance.   Dental Advisory Given  Plan Discussed with: Anesthesiologist, CRNA and Surgeon  Anesthesia Plan Comments:         Anesthesia Quick Evaluation

## 2017-01-05 NOTE — Transfer of Care (Signed)
Immediate Anesthesia Transfer of Care Note  Patient: Kevin Melton  Procedure(s) Performed: ESOPHAGOGASTRODUODENOSCOPY (EGD) WITH PROPOFOL With Dilation (N/A )  Patient Location: PACU  Anesthesia Type:General  Level of Consciousness: sedated  Airway & Oxygen Therapy: Patient Spontanous Breathing and Patient connected to face mask oxygen  Post-op Assessment: Report given to RN and Post -op Vital signs reviewed and stable  Post vital signs: Reviewed and stable  Last Vitals:  Vitals:   01/05/17 0951  BP: (!) 143/84  Pulse: (!) 49  Resp: 18  Temp: 36.5 C  SpO2: 98%    Last Pain:  Vitals:   01/05/17 0951  TempSrc: Tympanic         Complications: No apparent anesthesia complications

## 2017-01-05 NOTE — H&P (Signed)
  [name], MD [address]  [place]  [place], [place] [address]  Main: [phone]  Fax: [phone] Pager: [phone]  Primary Care Physician:  Virginia Crews, MD Primary Gastroenterologist:  Dr. [name]  Pre-Procedure History & Physical: HPI:  Kevin Melton is a 66 y.o. male is here for an endoscopy.   Past Medical History:  Diagnosis Date  . Abdominal hernia    umbilical  . Chronic kidney disease (CKD), active medical management without dialysis   . GERD (gastroesophageal reflux disease)   . Kidney stones   . MI (myocardial infarction) ([place]) 2013   1 stent placed    Past Surgical History:  Procedure Laterality Date  . COLONOSCOPY  2013  . CORONARY ANGIOPLASTY WITH STENT PLACEMENT  2013  . LITHOTRIPSY  2014    Prior to Admission medications   Medication Sig Start Date End Date Taking? Authorizing Provider  allopurinol (ZYLOPRIM) 300 MG tablet Take by mouth. 03/21/16  Yes [provider]  aspirin 81 MG chewable tablet Chew by mouth.   Yes [provider]  benazepril (LOTENSIN) 5 MG tablet  11/01/16  Yes [provider]  pravastatin (PRAVACHOL) 40 MG tablet Take by mouth. 03/21/16  Yes [provider]  nitroGLYCERIN (NITROSTAT) 0.4 MG SL tablet Place under the tongue. 03/11/15   [provider]    Allergies as of 12/23/2016  . (No Known Allergies)    Family History  Problem Relation Age of Onset  . Dementia Mother 55  . Lung disease Father 7       collapsed lung  . Healthy Sister   . Healthy Brother   . Healthy Son   . Healthy Sister   . Colon cancer Neg Hx   . Prostate cancer Neg Hx     Social History   Social History  . Marital status: Married    Spouse name: [name]  . Number of children: 1  . Years of education: 50   Occupational History  . Self Employed     [employer] History Main Topics  . Smoking status: Never Smoker  . Smokeless  tobacco: Never Used  . Alcohol use No  . Drug use: No  . Sexual activity: Yes    Partners: Female   Other Topics Concern  . Not on file   Social History Narrative  . No narrative on file    Review of Systems: See HPI, otherwise negative ROS  Physical Exam: BP (!) 143/84   Pulse (!) 49   Temp 97.7 F (36.5 C) (Tympanic)   Resp 18   Ht 5\' 9"  (1.753 m)   Wt 204 lb (92.5 kg)   SpO2 98%   BMI 30.13 kg/m  General:   Alert,  pleasant and cooperative in NAD Head:  Normocephalic and atraumatic. Neck:  Supple; no masses or thyromegaly. Lungs:  Clear throughout to auscultation.    Heart:  Regular rate and rhythm. Abdomen:  Soft, nontender and nondistended. Normal bowel sounds, without guarding, and without rebound.   Neurologic:  Alert and  oriented x4;  grossly normal neurologically.  Impression/Plan: Kevin Melton is here for an endoscopy to be performed for chronic dysphagia to solids  Risks, benefits, limitations, and alternatives regarding  endoscopy have been reviewed with the patient.  Questions have been answered.  All parties agreeable.   Sherri Sear, MD  01/05/2017, 10:35 AM

## 2017-01-05 NOTE — Anesthesia Postprocedure Evaluation (Signed)
Anesthesia Post Note  Patient: Kevin Melton  Procedure(s) Performed: ESOPHAGOGASTRODUODENOSCOPY (EGD) WITH PROPOFOL With Dilation (N/A )  Patient location during evaluation: Endoscopy Anesthesia Type: General Level of consciousness: awake and alert Pain management: pain level controlled Vital Signs Assessment: post-procedure vital signs reviewed and stable Respiratory status: spontaneous breathing, nonlabored ventilation, respiratory function stable and patient connected to nasal cannula oxygen Cardiovascular status: blood pressure returned to baseline and stable Postop Assessment: no apparent nausea or vomiting Anesthetic complications: no     Last Vitals:  Vitals:   01/05/17 1137 01/05/17 1147  BP: (!) 150/78 (!) 159/80  Pulse: (!) 53 (!) 55  Resp: (!) 22 18  Temp:    SpO2: 98% 100%    Last Pain:  Vitals:   01/05/17 1117  TempSrc: Tympanic                 Martha Clan

## 2017-01-06 ENCOUNTER — Encounter: Payer: Self-pay | Admitting: Gastroenterology

## 2017-01-06 LAB — SURGICAL PATHOLOGY

## 2017-01-09 ENCOUNTER — Telehealth: Payer: Self-pay

## 2017-01-09 NOTE — Telephone Encounter (Signed)
LVM for pt to contact office for results. Thanks [employer]

## 2017-01-11 ENCOUNTER — Ambulatory Visit: Payer: PPO | Admitting: Internal Medicine

## 2017-01-11 ENCOUNTER — Encounter: Payer: Self-pay | Admitting: Internal Medicine

## 2017-01-11 VITALS — BP 132/70 | HR 55 | Ht 69.0 in | Wt 206.0 lb

## 2017-01-11 DIAGNOSIS — I10 Essential (primary) hypertension: Secondary | ICD-10-CM | POA: Diagnosis not present

## 2017-01-11 DIAGNOSIS — I2510 Atherosclerotic heart disease of native coronary artery without angina pectoris: Secondary | ICD-10-CM | POA: Diagnosis not present

## 2017-01-11 DIAGNOSIS — E782 Mixed hyperlipidemia: Secondary | ICD-10-CM

## 2017-01-11 MED ORDER — ATORVASTATIN CALCIUM 40 MG PO TABS: 40.0000 mg | ORAL_TABLET | Freq: Every day | ORAL | 3 refills | Status: DC

## 2017-01-11 NOTE — Patient Instructions (Signed)
Medication Instructions:  Your physician has recommended you make the following change in your medication:  1- STOP Pravachol. 2- START Atorvastatin 40 mg (1 tablet) by mouth once a day.   Labwork: Your physician recommends that you return for lab work in: 3 MONTHS. ON OR AROUND April 20, 2017. - You will need to be fasting. DO NOT EAT OR DRINK after midnight the morning of the lab work. - Please go to the [facility]. You will check in at the front desk to the right as you walk into the atrium. Valet Parking is offered if needed.   Testing/Procedures: none  Follow-Up: Your physician wants you to follow-up in: 12 MONTHS WITH DR END. You will receive a reminder letter in the mail two months in advance. If you don't receive a letter, please call our office to schedule the follow-up appointment.   If you need a refill on your cardiac medications before your next appointment, please call your pharmacy.

## 2017-01-11 NOTE — Progress Notes (Signed)
New Outpatient Visit Date: 01/11/2017  Referring Provider: Virginia Crews, MD [address] [place], [place] [address]  Chief Complaint: Establish cardiac care with history of coronary artery disease  HPI:  Kevin Melton is a 66 y.o. male who is being seen today to establish cardiac care at the request of Virginia Crews, MD. He has a history of coronary artery disease s/p BMS to RCA in 2013 in the setting of inferior MI, hypertension, chronic kidney disease stage III, and gout.  He most recently was followed at [facility] in [place], [place].  He has had occasional diaphoresis and jaw discomfort, which is what he experienced at the time of his MI.  He has undergone subsequent noninvasive ischemia evaluations, all of which have been normal.  Today, Kevin Melton reports that he has been feeling well.  He denies chest pain, shortness of breath, jaw pain, diaphoresis, orthopnea, PND, and edema.  He has very rare episodes of palpitations (1-2 times per year), which do not have any accompanying symptoms.  They typically last 5 seconds or less.  He has not needed to use any nitroglycerin.  He reports being compliant with his medications and has not experienced any adverse effects.   --------------------------------------------------------------------------------------------------  Cardiovascular History & Procedures: Cardiovascular Problems:  Coronary artery disease status post PCI to RCA in the setting of inferior MI (2013)  Risk Factors:  Known coronary artery disease, hypertension, male gender, and age greater than 54  Cath/PCI:  LHC/PCI (2013, [place]): Details unknown.  Per report, bare-metal stent placed to RCA.  CV Surgery:  None  EP Procedures and Devices:  None  Non-Invasive Evaluation(s):  Pharmacologic MPI (11/05/13): Small reversible apical anteroseptal defect suggestive of ischemia.  Normal wall motion with LVEF of 60%.  Recent CV Pertinent  Labs: Lab Results  Component Value Date   CHOL 159 11/10/2016   HDL 39 (L) 11/10/2016   TRIG 199 (H) 11/10/2016   CHOLHDL 4.1 11/10/2016   K 4.2 12/13/2016   BUN 19 12/13/2016   CREATININE 1.36 (H) 12/13/2016    --------------------------------------------------------------------------------------------------  Past Medical History:  Diagnosis Date  . Abdominal hernia    umbilical  . Chronic kidney disease (CKD), active medical management without dialysis   . Coronary artery disease   . GERD (gastroesophageal reflux disease)   . Kidney stones   . MI (myocardial infarction) ([place]) 2013   1 stent placed    Past Surgical History:  Procedure Laterality Date  . COLONOSCOPY  2013  . CORONARY ANGIOPLASTY WITH STENT PLACEMENT  2013   [place]  . LITHOTRIPSY  2014    Current Meds  Medication Sig  . allopurinol (ZYLOPRIM) 300 MG tablet Take 300 mg daily by mouth.   Marland Kitchen aspirin 81 MG chewable tablet Chew 81 mg daily by mouth.   . benazepril (LOTENSIN) 5 MG tablet Take 5 mg daily by mouth.   . nitroGLYCERIN (NITROSTAT) 0.4 MG SL tablet Place under the tongue.  Marland Kitchen omeprazole (PRILOSEC) 40 MG capsule Take 1 capsule (40 mg total) by mouth 2 (two) times daily before a meal.  . [DISCONTINUED] pravastatin (PRAVACHOL) 40 MG tablet Take 40 mg daily by mouth.     Allergies: Patient has no known allergies.  Social History   Socioeconomic History  . Marital status: Married    Spouse name: [name]  . Number of children: 1  . Years of education: 68  . Highest education level: Not on file  Social Needs  . Emergency planning/management officer  strain: Not on file  . Food insecurity - worry: Not on file  . Food insecurity - inability: Not on file  . Transportation needs - medical: Not on file  . Transportation needs - non-medical: Not on file  Occupational History  . Occupation: Self Employed    Comment: [employer] Use  . Smoking status: Never Smoker  . Smokeless tobacco:  Never Used  Substance and Sexual Activity  . Alcohol use: No  . Drug use: No  . Sexual activity: Yes    Partners: Female  Other Topics Concern  . Not on file  Social History Narrative  . Not on file    Family History  Problem Relation Age of Onset  . Dementia Mother 44  . Lung disease Father 29       collapsed lung  . Healthy Sister   . Healthy Brother   . Heart Problems Brother   . Healthy Son   . Healthy Sister   . Colon cancer Neg Hx   . Prostate cancer Neg Hx   . Heart disease Neg Hx     Review of Systems: The patient reports 2 brief episodes of word finding difficulty approximately 2 years ago.  Carotid Doppler was recommended by his cardiologist at the time, though the test was never performed due to financial constraints. Kevin Melton has not had any recurrent events or focal neurologic deficits.  Otherwise, a 12-system review of systems was performed and was negative except as noted in the HPI.  --------------------------------------------------------------------------------------------------  Physical Exam: BP 132/70 (BP Location: Left Arm, Patient Position: Sitting, Cuff Size: Normal)   Pulse (!) 55   Ht 5\' 9"  (1.753 m)   Wt 206 lb (93.4 kg)   BMI 30.42 kg/m   General: Overweight man, seated comfortably in the exam room. HEENT: No conjunctival pallor or scleral icterus. Moist mucous membranes. OP clear. Neck: Supple without lymphadenopathy, thyromegaly, JVD, or HJR. No carotid bruit. Lungs: Normal work of breathing. Clear to auscultation bilaterally without wheezes or crackles. Heart: Regular rate and rhythm without murmurs, rubs, or gallops. Non-displaced PMI. Abd: Bowel sounds present. Soft, NT/ND without hepatosplenomegaly Ext: No lower extremity edema. Radial, PT, and DP pulses are 2+ bilaterally Skin: Warm and dry without rash. Neuro: CNIII-XII intact. Strength and fine-touch sensation intact in upper and lower extremities bilaterally. Psych: Normal  mood and affect.  EKG:  Sinus bradycardia (HR 55 bpm). Otherwise, no significant abnormalities.  Lab Results  Component Value Date   WBC 9.6 11/10/2016   HGB 16.2 11/10/2016   HCT 47.3 11/10/2016   MCV 87.8 11/10/2016   PLT 225 11/10/2016    Lab Results  Component Value Date   NA 140 12/13/2016   K 4.2 12/13/2016   CL 106 12/13/2016   CO2 26 12/13/2016   BUN 19 12/13/2016   CREATININE 1.36 (H) 12/13/2016   GLUCOSE 83 12/13/2016   ALT 18 11/10/2016    Lab Results  Component Value Date   CHOL 159 11/10/2016   HDL 39 (L) 11/10/2016   TRIG 199 (H) 11/10/2016   CHOLHDL 4.1 11/10/2016  LDL 90   --------------------------------------------------------------------------------------------------  ASSESSMENT AND PLAN: Coronary artery disease without angina Kevin Melton has done well following his MI in 2013.  He presents today to establish routine cardiovascular care.  He has not had any angina or symptoms reminiscent of what he experienced at the time of his MI.  EKG today demonstrates sinus bradycardia but otherwise no abnormalities.  We will continue with indefinite low-dose aspirin and statin therapy for secondary prevention.  Beta-blocker is precluded by resting bradycardia.  Hypertension Blood pressure is borderline elevated today.  We have discussed lifestyle modifications, including exercise, weight loss, and sodium restriction.  I will not make any medication changes today.  Hyperlipidemia Lipid panel in September was notable for an LDL of 90.  Given his history of prior MI and stent placement, I have recommended that we transition to a high intensity statin and target an LDL less than 70.  We will switch from pravastatin to atorvastatin 40 mg daily and repeat a fasting lipid panel and ALT in approximately 3 months.  Follow-up: Return to clinic in 1 year, sooner if symptoms arise.  Nelva Bush, MD 01/12/2017 9:00 PM

## 2017-01-12 ENCOUNTER — Encounter: Payer: Self-pay | Admitting: Internal Medicine

## 2017-01-12 DIAGNOSIS — I2510 Atherosclerotic heart disease of native coronary artery without angina pectoris: Secondary | ICD-10-CM | POA: Insufficient documentation

## 2017-01-25 ENCOUNTER — Telehealth: Payer: Self-pay

## 2017-01-25 NOTE — Telephone Encounter (Signed)
LVM for pt to contact office for lab results.  Thanks [employer]

## 2017-01-30 ENCOUNTER — Other Ambulatory Visit: Payer: Self-pay

## 2017-01-31 ENCOUNTER — Ambulatory Visit (INDEPENDENT_AMBULATORY_CARE_PROVIDER_SITE_OTHER): Payer: PPO | Admitting: Gastroenterology

## 2017-01-31 ENCOUNTER — Encounter: Payer: Self-pay | Admitting: Gastroenterology

## 2017-01-31 ENCOUNTER — Other Ambulatory Visit
Admission: RE | Admit: 2017-01-31 | Discharge: 2017-01-31 | Disposition: A | Discharge status: Home / Self Care | Payer: PPO | Source: Ambulatory Visit | Attending: Gastroenterology | Admitting: Gastroenterology

## 2017-01-31 ENCOUNTER — Encounter (INDEPENDENT_AMBULATORY_CARE_PROVIDER_SITE_OTHER): Payer: Self-pay

## 2017-01-31 VITALS — BP 171/73 | HR 54 | Temp 98.1°F | Ht 69.0 in | Wt 209.4 lb

## 2017-01-31 DIAGNOSIS — I10 Essential (primary) hypertension: Secondary | ICD-10-CM | POA: Diagnosis not present

## 2017-01-31 DIAGNOSIS — E782 Mixed hyperlipidemia: Secondary | ICD-10-CM | POA: Insufficient documentation

## 2017-01-31 DIAGNOSIS — K200 Eosinophilic esophagitis: Secondary | ICD-10-CM

## 2017-01-31 LAB — LIPID PANEL
CHOL/HDL RATIO: 3.6 ratio
Cholesterol: 108 mg/dL (ref 0–200)
HDL: 30 mg/dL — AB (ref >40)
LDL Cholesterol: 52 mg/dL (ref 0–99)
Triglycerides: 130 mg/dL (ref <150)
VLDL: 26 mg/dL (ref 0–40)

## 2017-01-31 LAB — ALT: ALT: 53 U/L (ref 17–63)

## 2017-01-31 NOTE — Progress Notes (Signed)
Kevin Darby, MD [address]  [place]  [place], [place] [address]  Main: [phone]  Fax: [phone]    Gastroenterology Consultation  Referring Provider:     Virginia Crews, MD Primary Care Physician:  Virginia Crews, MD Primary Gastroenterologist:  Dr. Cephas Melton Reason for Consultation:     Chronic dysphagia        HPI:   Zebedee Segundo Charon is a 66 y.o. male referred by Dr. Brita Romp, Dionne Bucy, MD  for consultation & management of chronic dysphagia to solids. He experienced food impaction in 2016, went to ER, food bolus spontaneously passed after glucagon and soda.  Another epsidoe 40months later, went to ER again Did not have EGD during both episodes. Since then, he stayed away from hard foods due to dysphagia He is very careful of what he is eating, did not see GI until now due to lack of insurance Currently, having nocturnal heart burn if he has late dinner Currently not on PPI or H2 blocker He has mildly elevated serum creatinine, was thought to be secondary to ACE inhibitor and also, he had history of kidney stones. We do not have his baseline creatinine He does not smoke, or drink alcohol, denies NSAID use He is a Geophysicist/field seismologist by profession He denies any family history of GI malignancies  Follow-up visit 01/31/2017: Since his initial visit, he underwent EGD which revealed eosinophilic esophagitis and confirmed on biopsies. I started him on omeprazole 40 mg twice a day and he manages to take it 2 times daily most of the days. He tends to forget to take before breakfast. Today, he is accompanied by his wife and feels asymptomatic. He no longer has heartburn, regurgitation, dysphagia. On recent labs he is found to have elevated eosinophils in 600s  GI Procedures: had a colonoscopy at age 77 and at age 59, reportedly normal EGD 01/05/2017 The duodenal bulb and second portion of the duodenum were normal. The entire examined stomach was  normal. The cardia and gastric fundus were normal on retroflexion.  Mucosal changes including ringed esophagus, feline appearance, longitudinal furrows and congestion (edema) were found in the entire esophagus. Esophageal findings were graded using the Eosinophilic Esophagitis Endoscopic Reference Score (EoE-EREFS) as: Edema Grade 1 Present (decreased clarity or absence of vascular markings), Rings Grade 2 Moderate (distinct rings that do not occlude passage of diagnostic 8-10 mm endoscope), Exudates Grade 0 None (no white lesions seen), Furrows Grade 1 Present (vertical lines with or without visible depth) and Stricture none (no stricture found). Biopsies were obtained from the proximal and distal esophagus with cold forceps for histology of suspected eosinophilic esophagitis. LA Grade B (one or more mucosal breaks greater than 5 mm, not extending between the tops of two mucosal folds) esophagitis with no bleeding was found 39 cm from the incisors.  Pathology: DIAGNOSIS:  A. DISTAL ESOPHAGUS; COLD BIOPSY:  - INCREASED INTRAEPITHELIAL EOSINOPHILS, UP TO 22 IN A HIGH-POWER FIELD,  SEE COMMENT.  - NEGATIVE FOR DYSPLASIA AND MALIGNANCY.   B. PROXIMAL ESOPHAGUS; COLD BIOPSY:  - INCREASED INTRAEPITHELIAL EOSINOPHILS, UP TO 23 IN A HIGH-POWER FIELD,  SEE COMMENT.  - NEGATIVE FOR DYSPLASIA AND MALIGNANCY.   Comment:  Sections show diffusely increased eosinophils with degranulation and  focal eosinophilic abscess formation. There is basal cell hyperplasia  and spongiosis. Changes in the proximal esophagus are slightly more  prominent than distal. These findings are nonspecific and may be seen in  GERD, eosinophilic esophagitis, systemic eosinophilic  diseases and other  disorders. Combined features of GERD and eosinophilic esophagitis are  sometimes seen. Correlation with endoscopic findings is helpful to  distinguish between these diseases.   Past Medical History:  Diagnosis Date  . Abdominal  hernia    umbilical  . Chronic kidney disease (CKD), active medical management without dialysis   . Chronic kidney disease, stage III (moderate) (HCC)   . Coronary artery disease   . GERD (gastroesophageal reflux disease)   . Kidney stones   . MI (myocardial infarction) ([place]) 2013   1 stent placed    Past Surgical History:  Procedure Laterality Date  . COLONOSCOPY  2013  . CORONARY ANGIOPLASTY WITH STENT PLACEMENT  2013   [place]  . ESOPHAGOGASTRODUODENOSCOPY (EGD) WITH PROPOFOL N/A 01/05/2017   Procedure: ESOPHAGOGASTRODUODENOSCOPY (EGD) WITH PROPOFOL With Dilation;  Surgeon: Lin Landsman, MD;  Location: [facility] ENDOSCOPY;  Service: Gastroenterology;  Laterality: N/A;  . LITHOTRIPSY  2014     Current Outpatient Medications:  .  allopurinol (ZYLOPRIM) 300 MG tablet, Take 300 mg daily by mouth. , Disp: , Rfl:  .  aspirin 81 MG chewable tablet, Chew 81 mg daily by mouth. , Disp: , Rfl:  .  atorvastatin (LIPITOR) 40 MG tablet, Take 1 tablet (40 mg total) daily by mouth., Disp: 90 tablet, Rfl: 3 .  nitroGLYCERIN (NITROSTAT) 0.4 MG SL tablet, Place under the tongue., Disp: , Rfl:  .  omeprazole (PRILOSEC) 40 MG capsule, Take 1 capsule (40 mg total) by mouth 2 (two) times daily before a meal., Disp: 60 capsule, Rfl: 2   Family History  Problem Relation Age of Onset  . Dementia Mother 31  . Lung disease Father 90       collapsed lung  . Healthy Sister   . Healthy Brother   . Heart Problems Brother   . Healthy Son   . Healthy Sister   . Colon cancer Neg Hx   . Prostate cancer Neg Hx   . Heart disease Neg Hx      Social History   Tobacco Use  . Smoking status: Never Smoker  . Smokeless tobacco: Never Used  Substance Use Topics  . Alcohol use: No  . Drug use: No    Allergies as of 01/31/2017  . (No Known Allergies)    Review of Systems:    All systems reviewed and negative except where noted in HPI.   Physical Exam:  BP (!) 171/73   Pulse (!) 54   Temp  98.1 F (36.7 C) (Oral)   Ht 5\' 9"  (1.753 m)   Wt 209 lb 6.4 oz (95 kg)   BMI 30.92 kg/m  No LMP for male patient.  General:   Alert,  Well-developed, well-nourished, pleasant and cooperative in NAD Head:  Normocephalic and atraumatic. Eyes:  Sclera clear, no icterus.   Conjunctiva pink. Ears:  Normal auditory acuity. Nose:  No deformity, discharge, or lesions. Mouth:  No deformity or lesions,oropharynx pink & moist. Neck:  Supple; no masses or thyromegaly. Lungs:  Respirations even and unlabored.  Clear throughout to auscultation.   No wheezes, crackles, or rhonchi. No acute distress. Heart:  Regular rate and rhythm; no murmurs, clicks, rubs, or gallops. Abdomen:  Normal bowel sounds. Soft, obese,non-tender and non-distended,   Medium size ventral hernia and umbilical hernia, nontender. No hepatosplenomegaly noted.  No guarding or rebound tenderness.   Rectal: Nor performed Msk:  Symmetrical without gross deformities. Good, equal movement & strength bilaterally. Pulses:  Normal pulses  noted. Extremities:  No clubbing or edema.  No cyanosis. Neurologic:  Alert and oriented x3;  grossly normal neurologically. Skin:  Intact without significant lesions or rashes. No jaundice. Lymph Nodes:  No significant cervical adenopathy. Psych:  Alert and cooperative. Normal mood and affect.  Imaging Studies: none  Assessment and Plan:   Husein Guedes Buchan is a 66 y.o. male with chronic dysphagia to solids, prior episodes of food impactions which spontaneously passed. Never had an EGD before. He also has Periumbilical and ventral hernias  1. Chronic dysphagia: Due to eosinophilic esophagitis, biopsy proven EREFS score 3 - Dysphagia resolved with omeprazole 40mg  BID  - I will continue the current dose for 2 more months, then repeat EGD with biopsies - Recheck Cr during next visit while on PPI. His Cr is stable currently - If he shows no histologic improvement, will start swallowed fluticasone  280mcg BID or budesonide slurry  2. Peripheral blood eosinophilia - Recheck CBC with differential - If eosinophils >1500cells/microL, will refer to hematology for work up hypereosinophilic syndrome  3. He is scheduled for hernia repair with Dr Bary Castilla on 02/1717  4. Colonoscopy at age 62  Follow up in 40months   Kevin Darby, MD

## 2017-02-09 ENCOUNTER — Encounter: Payer: PPO | Admitting: Family Medicine

## 2017-02-10 ENCOUNTER — Other Ambulatory Visit: Payer: Self-pay

## 2017-02-10 ENCOUNTER — Encounter
Admission: RE | Admit: 2017-02-10 | Discharge: 2017-02-10 | Disposition: A | Discharge status: Home / Self Care | Payer: PPO | Source: Ambulatory Visit | Attending: General Surgery | Admitting: General Surgery

## 2017-02-10 HISTORY — DX: Personal history of urinary calculi: Z87.442

## 2017-02-10 NOTE — Pre-Procedure Instructions (Signed)
Kevin Bush, MD  Physician  Cardiology  Progress Notes  Signed  Encounter Date:  01/11/2017          Signed      Expand All Collapse All       [] Hide copied text  [] Hover for details     New Outpatient Visit Date: 01/11/2017  Referring Provider: Virginia Crews, MD [address] [place],  [address]  Chief Complaint: Establish cardiac care with history of coronary artery disease  HPI:  Mr. Kevin Melton is a 66 y.o. male who is being seen today to establish cardiac care at the request of Kevin Crews, MD. He has a history of coronary artery disease s/p BMS to RCA in 2013 in the setting of inferior MI, hypertension, chronic kidney disease stage III, and gout.  He most recently was followed at [facility] in [place], [place].  He has had occasional diaphoresis and jaw discomfort, which is what he experienced at the time of his MI.  He has undergone subsequent noninvasive ischemia evaluations, all of which have been normal.  Today, Mr. Tall reports that he has been feeling well.  He denies chest pain, shortness of breath, jaw pain, diaphoresis, orthopnea, PND, and edema.  He has very rare episodes of palpitations (1-2 times per year), which do not have any accompanying symptoms.  They typically last 5 seconds or less.  He has not needed to use any nitroglycerin.  He reports being compliant with his medications and has not experienced any adverse effects.   --------------------------------------------------------------------------------------------------  Cardiovascular History & Procedures: Cardiovascular Problems:  Coronary artery disease status post PCI to RCA in the setting of inferior MI (2013)  Risk Factors:  Known coronary artery disease, hypertension, male gender, and age greater than 37  Cath/PCI:  LHC/PCI (2013, [place]): Details unknown.  Per report, bare-metal stent placed to RCA.  CV Surgery:  None  EP  Procedures and Devices:  None  Non-Invasive Evaluation(s):  Pharmacologic MPI (11/05/13): Small reversible apical anteroseptal defect suggestive of ischemia.  Normal wall motion with LVEF of 60%.  Recent CV Pertinent Labs: Labs(Brief)       Lab Results  Component Value Date   CHOL 159 11/10/2016   HDL 39 (L) 11/10/2016   TRIG 199 (H) 11/10/2016   CHOLHDL 4.1 11/10/2016   K 4.2 12/13/2016   BUN 19 12/13/2016   CREATININE 1.36 (H) 12/13/2016      --------------------------------------------------------------------------------------------------      Past Medical History:  Diagnosis Date  . Abdominal hernia    umbilical  . Chronic kidney disease (CKD), active medical management without dialysis   . Coronary artery disease   . GERD (gastroesophageal reflux disease)   . Kidney stones   . MI (myocardial infarction) ([place]) 2013   1 stent placed         Past Surgical History:  Procedure Laterality Date  . COLONOSCOPY  2013  . CORONARY ANGIOPLASTY WITH STENT PLACEMENT  2013   [place]  . LITHOTRIPSY  2014    ActiveMedications      Current Meds  Medication Sig  . allopurinol (ZYLOPRIM) 300 MG tablet Take 300 mg daily by mouth.   Marland Kitchen aspirin 81 MG chewable tablet Chew 81 mg daily by mouth.   . benazepril (LOTENSIN) 5 MG tablet Take 5 mg daily by mouth.   . nitroGLYCERIN (NITROSTAT) 0.4 MG SL tablet Place under the tongue.  Marland Kitchen omeprazole (PRILOSEC) 40 MG capsule Take 1 capsule (40 mg total) by mouth  2 (two) times daily before a meal.  . [DISCONTINUED] pravastatin (PRAVACHOL) 40 MG tablet Take 40 mg daily by mouth.       Allergies: Patient has no known allergies.  Social History        Socioeconomic History  . Marital status: Married    Spouse name: Kevin Melton  . Number of children: 1  . Years of education: 78  . Highest education level: Not on file  Social Needs  . Financial resource strain: Not on file  . Food insecurity -  worry: Not on file  . Food insecurity - inability: Not on file  . Transportation needs - medical: Not on file  . Transportation needs - non-medical: Not on file  Occupational History  . Occupation: Self Employed    Comment: [employer] Use  . Smoking status: Never Smoker  . Smokeless tobacco: Never Used  Substance and Sexual Activity  . Alcohol use: No  . Drug use: No  . Sexual activity: Yes    Partners: Female  Other Topics Concern  . Not on file  Social History Narrative  . Not on file         Family History  Problem Relation Age of Onset  . Dementia Mother 68  . Lung disease Father 104       collapsed lung  . Healthy Sister   . Healthy Brother   . Heart Problems Brother   . Healthy Son   . Healthy Sister   . Colon cancer Neg Hx   . Prostate cancer Neg Hx   . Heart disease Neg Hx     Review of Systems: The patient reports 2 brief episodes of word finding difficulty approximately 2 years ago.  Carotid Doppler was recommended by his cardiologist at the time, though the test was never performed due to financial constraints. [name] has not had any recurrent events or focal neurologic deficits.  Otherwise, a 12-system review of systems was performed and was negative except as noted in the HPI.  --------------------------------------------------------------------------------------------------  Physical Exam: BP 132/70 (BP Location: Left Arm, Patient Position: Sitting, Cuff Size: Normal)   Pulse (!) 55   Ht 5\' 9"  (1.753 m)   Wt 206 lb (93.4 kg)   BMI 30.42 kg/m   General: Overweight man, seated comfortably in the exam room. HEENT: No conjunctival pallor or scleral icterus. Moist mucous membranes. OP clear. Neck: Supple without lymphadenopathy, thyromegaly, JVD, or HJR. No carotid bruit. Lungs: Normal work of breathing. Clear to auscultation bilaterally without wheezes or crackles. Heart: Regular rate and rhythm  without murmurs, rubs, or gallops. Non-displaced PMI. Abd: Bowel sounds present. Soft, NT/ND without hepatosplenomegaly Ext: No lower extremity edema. Radial, PT, and DP pulses are 2+ bilaterally Skin: Warm and dry without rash. Neuro: CNIII-XII intact. Strength and fine-touch sensation intact in upper and lower extremities bilaterally. Psych: Normal mood and affect.  EKG:  Sinus bradycardia (HR 55 bpm). Otherwise, no significant abnormalities.  RecentLabs       Lab Results  Component Value Date   WBC 9.6 11/10/2016   HGB 16.2 11/10/2016   HCT 47.3 11/10/2016   MCV 87.8 11/10/2016   PLT 225 11/10/2016      RecentLabs       Lab Results  Component Value Date   NA 140 12/13/2016   K 4.2 12/13/2016   CL 106 12/13/2016   CO2 26 12/13/2016   BUN 19 12/13/2016   CREATININE 1.36 (H) 12/13/2016  GLUCOSE 83 12/13/2016   ALT 18 11/10/2016      RecentLabs       Lab Results  Component Value Date   CHOL 159 11/10/2016   HDL 39 (L) 11/10/2016   TRIG 199 (H) 11/10/2016   CHOLHDL 4.1 11/10/2016    LDL 90   --------------------------------------------------------------------------------------------------  ASSESSMENT AND PLAN: Coronary artery disease without angina Mr. Hane has done well following his MI in 2013.  He presents today to establish routine cardiovascular care.  He has not had any angina or symptoms reminiscent of what he experienced at the time of his MI.  EKG today demonstrates sinus bradycardia but otherwise no abnormalities.  We will continue with indefinite low-dose aspirin and statin therapy for secondary prevention.  Beta-blocker is precluded by resting bradycardia.  Hypertension Blood pressure is borderline elevated today.  We have discussed lifestyle modifications, including exercise, weight loss, and sodium restriction.  I will not make any medication changes today.  Hyperlipidemia Lipid panel in September was notable  for an LDL of 90.  Given his history of prior MI and stent placement, I have recommended that we transition to a high intensity statin and target an LDL less than 70.  We will switch from pravastatin to atorvastatin 40 mg daily and repeat a fasting lipid panel and ALT in approximately 3 months.  Follow-up: Return to clinic in 1 year, sooner if symptoms arise.  Kevin Bush, MD 01/12/2017 9:00 PM           Electronically signed by Kevin Bush, MD at 01/12/2017 9:04 PM     Office Visit on 01/11/2017        Detailed Report

## 2017-02-10 NOTE — Patient Instructions (Signed)
  Your procedure is scheduled on: 02-20-17  Report to Same Day Surgery 2nd floor medical mall [facility] Entrance-take elevator on left to 2nd floor.  Check in with surgery information desk.) To find out your arrival time please call [phone] between 1PM - 3PM on 02-17-17  Remember: Instructions that are not followed completely may result in serious medical risk, up to and including death, or upon the discretion of your surgeon and anesthesiologist your surgery may need to be rescheduled.    _x___ 1. Do not eat food after midnight the night before your procedure. You may drink clear liquids up to 2 hours before you are scheduled to arrive at the hospital for your procedure.  Do not drink clear liquids within 2 hours of your scheduled arrival to the hospital.  Clear liquids include  --Water or Apple juice without pulp  --Clear carbohydrate beverage such as ClearFast or Gatorade  --Black Coffee or Clear Tea (No milk, no creamers, do not add anything to the coffee or Tea   No gum chewing or hard candies.     __x__ 2. No Alcohol for 24 hours before or after surgery.   __x__3. No Smoking for 24 prior to surgery.   ____  4. Bring all medications with you on the day of surgery if instructed.    __x__ 5. Notify your doctor if there is any change in your medical condition     (cold, fever, infections).     Do not wear jewelry, make-up, hairpins, clips or nail polish.  Do not wear lotions, powders, or perfumes. You may wear deodorant.  Do not shave 48 hours prior to surgery. Men may shave face and neck.  Do not bring valuables to the hospital.    [facility] is not responsible for any belongings or valuables.               Contacts, dentures or bridgework may not be worn into surgery.  Leave your suitcase in the car. After surgery it may be brought to your room.  For patients admitted to the hospital, discharge time is determined by your treatment team.   Patients discharged the day  of surgery will not be allowed to drive home.  You will need someone to drive you home and stay with you the night of your procedure.    _x___ TAKE THE FOLLOWING MEDICATION THE MORNING OF SURGERY WITH A SMALL SIP OF WATER. These include:  1. PRILOSEC  2.  3.  4.  5.  6.  ____Fleets enema or Magnesium Citrate as directed.   ____ Use CHG Soap or sage wipes as directed on instruction sheet   ____ Use inhalers on the day of surgery and bring to hospital day of surgery  ____ Stop Metformin and Janumet 2 days prior to surgery.    ____ Take 1/2 of usual insulin dose the night before surgery and none on the morning  surgery.   ____ Follow recommendations from Cardiologist, Pulmonologist or PCP regarding stopping Aspirin, Coumadin, Plavix ,Eliquis, Effient, or Pradaxa, and Pletal-OK TO CONTINUE 81 MG ASPIRN  ____Stop Anti-inflammatories such as Advil, Aleve, Ibuprofen, Motrin, Naproxen, Naprosyn, Goodies powders or aspirin products. OK to take Tylenol .   ____ Stop supplements until after surgery.    ____ Bring C-Pap to the hospital.

## 2017-02-14 ENCOUNTER — Encounter: Payer: Self-pay | Admitting: Family Medicine

## 2017-02-17 ENCOUNTER — Encounter: Payer: Self-pay | Admitting: *Deleted

## 2017-02-19 MED ORDER — CEFAZOLIN SODIUM-DEXTROSE 2-4 GM/100ML-% IV SOLN
2.0000 g | INTRAVENOUS | Status: AC
  Administered 2017-02-20: 2 g via INTRAVENOUS

## 2017-02-20 ENCOUNTER — Ambulatory Visit: Payer: PPO | Admitting: Anesthesiology

## 2017-02-20 ENCOUNTER — Encounter
Admission: RE | Disposition: A | Discharge status: Home / Self Care | Payer: Self-pay | Source: Ambulatory Visit | Attending: General Surgery

## 2017-02-20 ENCOUNTER — Encounter: Payer: Self-pay | Admitting: Anesthesiology

## 2017-02-20 ENCOUNTER — Other Ambulatory Visit: Payer: Self-pay

## 2017-02-20 ENCOUNTER — Ambulatory Visit
Admission: RE | Admit: 2017-02-20 | Discharge: 2017-02-20 | Disposition: A | Discharge status: Home / Self Care | Payer: PPO | Source: Ambulatory Visit | Attending: General Surgery | Admitting: General Surgery

## 2017-02-20 DIAGNOSIS — Z87442 Personal history of urinary calculi: Secondary | ICD-10-CM | POA: Insufficient documentation

## 2017-02-20 DIAGNOSIS — I252 Old myocardial infarction: Secondary | ICD-10-CM | POA: Diagnosis not present

## 2017-02-20 DIAGNOSIS — Z7982 Long term (current) use of aspirin: Secondary | ICD-10-CM | POA: Diagnosis not present

## 2017-02-20 DIAGNOSIS — Z79899 Other long term (current) drug therapy: Secondary | ICD-10-CM | POA: Insufficient documentation

## 2017-02-20 DIAGNOSIS — K429 Umbilical hernia without obstruction or gangrene: Secondary | ICD-10-CM

## 2017-02-20 DIAGNOSIS — K219 Gastro-esophageal reflux disease without esophagitis: Secondary | ICD-10-CM | POA: Insufficient documentation

## 2017-02-20 DIAGNOSIS — N183 Chronic kidney disease, stage 3 (moderate): Secondary | ICD-10-CM | POA: Insufficient documentation

## 2017-02-20 DIAGNOSIS — I2510 Atherosclerotic heart disease of native coronary artery without angina pectoris: Secondary | ICD-10-CM | POA: Insufficient documentation

## 2017-02-20 DIAGNOSIS — M109 Gout, unspecified: Secondary | ICD-10-CM | POA: Diagnosis not present

## 2017-02-20 DIAGNOSIS — I10 Essential (primary) hypertension: Secondary | ICD-10-CM | POA: Diagnosis not present

## 2017-02-20 DIAGNOSIS — E782 Mixed hyperlipidemia: Secondary | ICD-10-CM | POA: Diagnosis not present

## 2017-02-20 DIAGNOSIS — Z955 Presence of coronary angioplasty implant and graft: Secondary | ICD-10-CM | POA: Insufficient documentation

## 2017-02-20 DIAGNOSIS — N200 Calculus of kidney: Secondary | ICD-10-CM | POA: Diagnosis not present

## 2017-02-20 HISTORY — PX: UMBILICAL HERNIA REPAIR: SHX196

## 2017-02-20 SURGERY — REPAIR, HERNIA, UMBILICAL, ADULT
Anesthesia: General | Wound class: Clean

## 2017-02-20 MED ORDER — FENTANYL CITRATE (PF) 100 MCG/2ML IJ SOLN: 25.0000 ug | INTRAMUSCULAR | Status: DC | PRN

## 2017-02-20 MED ORDER — HYDROCODONE-ACETAMINOPHEN 5-325 MG PO TABS
1.0000 | ORAL_TABLET | ORAL | Status: DC | PRN
  Administered 2017-02-20: 1 via ORAL

## 2017-02-20 MED ORDER — ONDANSETRON HCL 4 MG/2ML IJ SOLN
INTRAMUSCULAR | Status: AC
  Filled 2017-02-20: qty 2

## 2017-02-20 MED ORDER — ONDANSETRON HCL 4 MG/2ML IJ SOLN: 4.0000 mg | Freq: Once | INTRAMUSCULAR | Status: DC | PRN

## 2017-02-20 MED ORDER — HYDROCODONE-ACETAMINOPHEN 5-325 MG PO TABS: 1.0000 | ORAL_TABLET | ORAL | 0 refills | Status: DC | PRN

## 2017-02-20 MED ORDER — FENTANYL CITRATE (PF) 100 MCG/2ML IJ SOLN
INTRAMUSCULAR | Status: AC
  Filled 2017-02-20: qty 2

## 2017-02-20 MED ORDER — FAMOTIDINE 20 MG PO TABS
ORAL_TABLET | ORAL | Status: AC
  Administered 2017-02-20: 20 mg
  Filled 2017-02-20: qty 1

## 2017-02-20 MED ORDER — FENTANYL CITRATE (PF) 100 MCG/2ML IJ SOLN
INTRAMUSCULAR | Status: DC | PRN
  Administered 2017-02-20 (×2): 50 ug via INTRAVENOUS

## 2017-02-20 MED ORDER — CEFAZOLIN SODIUM-DEXTROSE 2-4 GM/100ML-% IV SOLN
INTRAVENOUS | Status: AC
  Filled 2017-02-20: qty 100

## 2017-02-20 MED ORDER — PROPOFOL 10 MG/ML IV BOLUS
INTRAVENOUS | Status: DC | PRN
  Administered 2017-02-20: 140 mg via INTRAVENOUS

## 2017-02-20 MED ORDER — KETOROLAC TROMETHAMINE 30 MG/ML IJ SOLN
INTRAMUSCULAR | Status: DC | PRN
  Administered 2017-02-20: 30 mg via INTRAVENOUS

## 2017-02-20 MED ORDER — PROPOFOL 10 MG/ML IV BOLUS
INTRAVENOUS | Status: AC
  Filled 2017-02-20: qty 20

## 2017-02-20 MED ORDER — LACTATED RINGERS IV SOLN
INTRAVENOUS | Status: DC
  Administered 2017-02-20 (×2): via INTRAVENOUS

## 2017-02-20 MED ORDER — MIDAZOLAM HCL 2 MG/2ML IJ SOLN
INTRAMUSCULAR | Status: AC
  Filled 2017-02-20: qty 2

## 2017-02-20 MED ORDER — HYDROMORPHONE HCL 1 MG/ML IJ SOLN
INTRAMUSCULAR | Status: AC
  Filled 2017-02-20: qty 1

## 2017-02-20 MED ORDER — LIDOCAINE HCL (CARDIAC) 20 MG/ML IV SOLN
INTRAVENOUS | Status: DC | PRN
  Administered 2017-02-20: 100 mg via INTRAVENOUS

## 2017-02-20 MED ORDER — MIDAZOLAM HCL 2 MG/2ML IJ SOLN
INTRAMUSCULAR | Status: DC | PRN
  Administered 2017-02-20: 2 mg via INTRAVENOUS

## 2017-02-20 MED ORDER — DEXAMETHASONE SODIUM PHOSPHATE 10 MG/ML IJ SOLN
INTRAMUSCULAR | Status: DC | PRN
  Administered 2017-02-20: 5 mg via INTRAVENOUS

## 2017-02-20 MED ORDER — BUPIVACAINE HCL (PF) 0.5 % IJ SOLN
INTRAMUSCULAR | Status: AC
Start: 2017-02-20 — End: ?
  Filled 2017-02-20: qty 30

## 2017-02-20 MED ORDER — HYDROMORPHONE HCL 1 MG/ML IJ SOLN
INTRAMUSCULAR | Status: DC | PRN
  Administered 2017-02-20: .3 mg via INTRAVENOUS

## 2017-02-20 MED ORDER — ACETAMINOPHEN 10 MG/ML IV SOLN
INTRAVENOUS | Status: DC | PRN
  Administered 2017-02-20: 1000 mg via INTRAVENOUS

## 2017-02-20 MED ORDER — HYDROCODONE-ACETAMINOPHEN 5-325 MG PO TABS
ORAL_TABLET | ORAL | Status: AC
  Filled 2017-02-20: qty 1

## 2017-02-20 MED ORDER — BUPIVACAINE HCL 0.5 % IJ SOLN
INTRAMUSCULAR | Status: DC | PRN
  Administered 2017-02-20: 30 mL

## 2017-02-20 MED ORDER — ONDANSETRON HCL 4 MG/2ML IJ SOLN
INTRAMUSCULAR | Status: DC | PRN
  Administered 2017-02-20: 4 mg via INTRAVENOUS

## 2017-02-20 SURGICAL SUPPLY — 30 items
BLADE SURG 15 STRL SS SAFETY (BLADE) ×3 IMPLANT
CANISTER SUCT 1200ML W/VALVE (MISCELLANEOUS) ×3 IMPLANT
CHLORAPREP W/TINT 26ML (MISCELLANEOUS) ×3 IMPLANT
CLOSURE WOUND 1/2 X4 (GAUZE/BANDAGES/DRESSINGS) ×1
DRAPE LAPAROTOMY 100X77 ABD (DRAPES) ×3 IMPLANT
DRSG TEGADERM 4X4.75 (GAUZE/BANDAGES/DRESSINGS) ×3 IMPLANT
DRSG TELFA 4X3 1S NADH ST (GAUZE/BANDAGES/DRESSINGS) ×3 IMPLANT
ELECT REM PT RETURN 9FT ADLT (ELECTROSURGICAL) ×3
ELECTRODE REM PT RTRN 9FT ADLT (ELECTROSURGICAL) ×1 IMPLANT
GLOVE BIO SURGEON STRL SZ7.5 (GLOVE) ×6 IMPLANT
GLOVE INDICATOR 8.0 STRL GRN (GLOVE) ×6 IMPLANT
GOWN STRL REUS W/ TWL LRG LVL3 (GOWN DISPOSABLE) ×2 IMPLANT
GOWN STRL REUS W/TWL LRG LVL3 (GOWN DISPOSABLE) ×4
KIT RM TURNOVER STRD PROC AR (KITS) ×3 IMPLANT
LABEL OR SOLS (LABEL) ×3 IMPLANT
NEEDLE HYPO 22GX1.5 SAFETY (NEEDLE) ×3 IMPLANT
NEEDLE HYPO 25X1 1.5 SAFETY (NEEDLE) IMPLANT
NS IRRIG 500ML POUR BTL (IV SOLUTION) ×3 IMPLANT
PACK BASIN MINOR ARMC (MISCELLANEOUS) ×3 IMPLANT
STRIP CLOSURE SKIN 1/2X4 (GAUZE/BANDAGES/DRESSINGS) ×2 IMPLANT
SUT PROLENE 0 CT 1 30 (SUTURE) ×3 IMPLANT
SUT SURGILON 0 BLK (SUTURE) ×6 IMPLANT
SUT VIC AB 3-0 54X BRD REEL (SUTURE) ×2 IMPLANT
SUT VIC AB 3-0 BRD 54 (SUTURE) ×4
SUT VIC AB 3-0 SH 27 (SUTURE) ×2
SUT VIC AB 3-0 SH 27X BRD (SUTURE) ×1 IMPLANT
SUT VIC AB 4-0 FS2 27 (SUTURE) ×3 IMPLANT
SWABSTK COMLB BENZOIN TINCTURE (MISCELLANEOUS) ×3 IMPLANT
SYR 3ML LL SCALE MARK (SYRINGE) ×3 IMPLANT
SYR CONTROL 10ML (SYRINGE) ×6 IMPLANT

## 2017-02-20 NOTE — H&P (Signed)
Loden Laurent Petzold [id number] [birth year]     HPI: 66y/o male with an umbilical hernia for elective repair.   Medications Prior to Admission  Medication Sig Dispense Refill Last Dose  . allopurinol (ZYLOPRIM) 300 MG tablet Take 300 mg by mouth at bedtime.    02/19/2017 at Unknown time  . aspirin 81 MG chewable tablet Chew 81 mg by mouth at bedtime.    02/19/2017 at Unknown time  . atorvastatin (LIPITOR) 40 MG tablet Take 1 tablet (40 mg total) daily by mouth. (Patient taking differently: Take 40 mg by mouth daily at 6 PM. ) 90 tablet 3 02/19/2017 at Unknown time  . benazepril (LOTENSIN) 5 MG tablet Take 5 mg by mouth at bedtime.   02/19/2017 at Unknown time  . nitroGLYCERIN (NITROSTAT) 0.4 MG SL tablet Place 0.4 mg under the tongue every 5 (five) minutes as needed for chest pain.    02/19/2017 at Unknown time  . omeprazole (PRILOSEC) 40 MG capsule Take 1 capsule (40 mg total) by mouth 2 (two) times daily before a meal. 60 capsule 2 02/19/2017 at Unknown time   No Known Allergies Past Medical History:  Diagnosis Date  . Abdominal hernia    umbilical  . Chronic kidney disease (CKD), active medical management without dialysis   . Chronic kidney disease, stage III (moderate) (HCC)   . Coronary artery disease   . GERD (gastroesophageal reflux disease)   . History of kidney stones    h/o  . Kidney stones   . MI (myocardial infarction) [facility]) 2013   1 stent placed-sees Dr Estrella Myrtle) last seen in nov 2018   Past Surgical History:  Procedure Laterality Date  . COLONOSCOPY  2013  . CORONARY ANGIOPLASTY WITH STENT PLACEMENT  2013   [place]  . ESOPHAGOGASTRODUODENOSCOPY (EGD) WITH PROPOFOL N/A 01/05/2017   Procedure: ESOPHAGOGASTRODUODENOSCOPY (EGD) WITH PROPOFOL With Dilation;  Surgeon: Lin Landsman, MD;  Location: [facility] ENDOSCOPY;  Service: Gastroenterology;  Laterality: N/A;  . LITHOTRIPSY  2014   Social History   Socioeconomic History  . Marital status: Married   Spouse name: [name]  . Number of children: 1  . Years of education: 78  . Highest education level: Not on file  Social Needs  . Financial resource strain: Not on file  . Food insecurity - worry: Not on file  . Food insecurity - inability: Not on file  . Transportation needs - medical: Not on file  . Transportation needs - non-medical: Not on file  Occupational History  . Occupation: Self Employed    Comment: [employer] Use  . Smoking status: Never Smoker  . Smokeless tobacco: Never Used  Substance and Sexual Activity  . Alcohol use: No  . Drug use: No  . Sexual activity: Yes    Partners: Female  Other Topics Concern  . Not on file  Social History Narrative  . Not on file   Social History   Social History Narrative  . Not on file     ROS: Negative.     PE: HEENT: Negative. Lungs: Clear. Cardio: RR. Robert Bellow 02/20/2017   Assessment/Plan:  Proceed with planned umbilical hernia repair.

## 2017-02-20 NOTE — Discharge Instructions (Signed)

## 2017-02-20 NOTE — Op Note (Signed)
Preoperative diagnosis: Umbilical hernia.  Postoperative diagnosis: Same.  Operative procedure: Repair of umbilical hernia.  Operating Surgeon: Hervey Ard, MD.  Anesthesia: General by LMA, Marcaine 0.5%, plain 30 cc local infiltration.  Estimated blood loss: Less than 2 cc.  Clinical note: This 66 year old male has developed a symptomatic and slowly enlarging umbilical hernia.  He was admitted for elective repair.  He did receive Kefzol prior to the procedure in the event prosthetic mesh placement was needed.  Operative note: The abdomen was cleansed with ChloraPrep after the induction of general anesthesia by LMA.  Marcaine was infiltrated for postoperative analgesia.  An infraumbilical incision was made sharply and remaining dissection completed with scissors.  The hernia sac was separated from the overlying umbilical skin.  This was then transected at the fascial level with cautery.  The defect measured 2 cm.  Undersurface of the fascia was clear.  No epigastric hernia could be palpated through the defect.  Primary repair was planned with interrupted 0 Surgilon sutures.  These were placed sequentially and then tied.  The umbilical skin was tacked to the fascia with a 3-0 Vicryl figure-of-eight suture.  Adipose layer closed with a running 3-0 Vicryl suture.  Skin closed with a running 4-0 Vicryl subarticular suture.  Benzoin, Steri-Strips, Telfa and Tegaderm dressing applied.  The patient tolerated the procedure well and was brought to the recovery room in stable condition.

## 2017-02-20 NOTE — Addendum Note (Signed)
Addendum  created 02/20/17 1215 by Justus Memory, CRNA   Intraprocedure Meds edited

## 2017-02-20 NOTE — Transfer of Care (Signed)
Immediate Anesthesia Transfer of Care Note  Patient: Kevin Melton  Procedure(s) Performed: HERNIA REPAIR UMBILICAL ADULT (N/A )  Patient Location: PACU  Anesthesia Type:General  Level of Consciousness: sedated  Airway & Oxygen Therapy: Patient Spontanous Breathing and Patient connected to face mask oxygen  Post-op Assessment: Report given to RN and Post -op Vital signs reviewed and stable  Post vital signs: Reviewed and stable  Last Vitals:  Vitals:   02/20/17 0820 02/20/17 0830  BP:  129/79  Pulse: (!) 59 (!) 55  Resp: 13 12  Temp:    SpO2: 98% 94%    Last Pain:  Vitals:   02/20/17 0820  TempSrc:   PainSc: 0-No pain         Complications: No apparent anesthesia complications

## 2017-02-20 NOTE — Anesthesia Post-op Follow-up Note (Signed)
Anesthesia QCDR form completed.        

## 2017-02-20 NOTE — Anesthesia Postprocedure Evaluation (Signed)
Anesthesia Post Note  Patient: Kevin Melton  Procedure(s) Performed: HERNIA REPAIR UMBILICAL ADULT (N/A )  Patient location during evaluation: PACU Anesthesia Type: General Level of consciousness: awake and alert Pain management: pain level controlled Vital Signs Assessment: post-procedure vital signs reviewed and stable Respiratory status: spontaneous breathing, nonlabored ventilation, respiratory function stable and patient connected to nasal cannula oxygen Cardiovascular status: blood pressure returned to baseline and stable Postop Assessment: no apparent nausea or vomiting Anesthetic complications: no     Last Vitals:  Vitals:   02/20/17 0857 02/20/17 0930  BP: (!) 142/88 (!) 141/86  Pulse: (!) 51 (!) 51  Resp: 16 16  Temp: 36.7 C (!) 36.4 C  SpO2: 95% 97%    Last Pain:  Vitals:   02/20/17 0930  TempSrc: Temporal  PainSc: [place]

## 2017-02-20 NOTE — Anesthesia Preprocedure Evaluation (Signed)
Anesthesia Evaluation  Patient identified by MRN, date of birth, ID band Patient awake    Reviewed: Allergy & Precautions, NPO status , Patient's Chart, lab work & pertinent test results, reviewed documented beta blocker date and time   Airway Mallampati: III  TM Distance: >3 FB     Dental  (+) Chipped   Pulmonary           Cardiovascular hypertension, Pt. on medications + CAD, + Past MI and + Cardiac Stents       Neuro/Psych    GI/Hepatic PUD, GERD  Controlled,  Endo/Other    Renal/GU      Musculoskeletal   Abdominal   Peds  Hematology   Anesthesia Other Findings Gout.  Reproductive/Obstetrics                             Anesthesia Physical Anesthesia Plan  ASA: III  Anesthesia Plan: General   Post-op Pain Management:    Induction: Intravenous  PONV Risk Score and Plan:   Airway Management Planned: LMA  Additional Equipment:   Intra-op Plan:   Post-operative Plan:   Informed Consent: I have reviewed the patients History and Physical, chart, labs and discussed the procedure including the risks, benefits and alternatives for the proposed anesthesia with the patient or authorized representative who has indicated his/her understanding and acceptance.     Plan Discussed with: CRNA  Anesthesia Plan Comments:         Anesthesia Quick Evaluation

## 2017-02-20 NOTE — Anesthesia Procedure Notes (Signed)
Procedure Name: LMA Insertion Date/Time: 02/20/2017 7:35 AM Performed by: Justus Memory, CRNA Pre-anesthesia Checklist: Patient identified, Patient being monitored, Timeout performed, Emergency Drugs available and Suction available Patient Re-evaluated:Patient Re-evaluated prior to induction Oxygen Delivery Method: Circle system utilized Preoxygenation: Pre-oxygenation with 100% oxygen Induction Type: IV induction Ventilation: Mask ventilation without difficulty LMA: LMA inserted LMA Size: 4.5 Tube type: Oral Number of attempts: 1 Placement Confirmation: positive ETCO2 and breath sounds checked- equal and bilateral Tube secured with: Tape Dental Injury: Teeth and Oropharynx as per pre-operative assessment

## 2017-02-20 NOTE — OR Nursing (Signed)
Per Dr. Bary Castilla, verbal @ 0830, ok to d/c patient home without his visit to postop.

## 2017-03-06 ENCOUNTER — Encounter: Payer: Self-pay | Admitting: General Surgery

## 2017-03-06 ENCOUNTER — Ambulatory Visit (INDEPENDENT_AMBULATORY_CARE_PROVIDER_SITE_OTHER): Payer: PPO | Admitting: General Surgery

## 2017-03-06 VITALS — BP 130/72 | HR 74 | Resp 14 | Ht 71.0 in | Wt 206.0 lb

## 2017-03-06 DIAGNOSIS — K429 Umbilical hernia without obstruction or gangrene: Secondary | ICD-10-CM

## 2017-03-06 NOTE — Progress Notes (Signed)
Patient ID: Kevin Melton, male   DOB: [birth year], 66 y.o.   MRN: [id number]  Chief Complaint  Patient presents with  . Routine Post Op    HPI Kevin Melton is a 66 y.o. male here today for his post op umbili8cal hernia repair done on 02/20/2017. Patient states he is doing well.  HPI  Past Medical History:  Diagnosis Date  . Abdominal hernia    umbilical  . Chronic kidney disease (CKD), active medical management without dialysis   . Chronic kidney disease, stage III (moderate) (HCC)   . Coronary artery disease   . GERD (gastroesophageal reflux disease)   . History of kidney stones    h/o  . Kidney stones   . MI (myocardial infarction) [facility]) 2013   1 stent placed-sees Dr Estrella Myrtle) last seen in nov 2018    Past Surgical History:  Procedure Laterality Date  . COLONOSCOPY  2013  . CORONARY ANGIOPLASTY WITH STENT PLACEMENT  2013   [place]  . ESOPHAGOGASTRODUODENOSCOPY (EGD) WITH PROPOFOL N/A 01/05/2017   Procedure: ESOPHAGOGASTRODUODENOSCOPY (EGD) WITH PROPOFOL With Dilation;  Surgeon: Lin Landsman, MD;  Location: [facility] ENDOSCOPY;  Service: Gastroenterology;  Laterality: N/A;  . LITHOTRIPSY  2014  . UMBILICAL HERNIA REPAIR N/A 02/20/2017   Primary repair 2 cm umbilical hernia;  Surgeon: Kevin Bellow, MD;  Location: [facility] ORS;  Service: General;  Laterality: N/A;    Family History  Problem Relation Age of Onset  . Dementia Mother 58  . Lung disease Father 33       collapsed lung  . Healthy Sister   . Healthy Brother   . Heart Problems Brother   . Healthy Son   . Healthy Sister   . Colon cancer Neg Hx   . Prostate cancer Neg Hx   . Heart disease Neg Hx     Social History Social History   Tobacco Use  . Smoking status: Never Smoker  . Smokeless tobacco: Never Used  Substance Use Topics  . Alcohol use: No  . Drug use: No    No Known Allergies  Current Outpatient Medications  Medication Sig Dispense Refill  . allopurinol  (ZYLOPRIM) 300 MG tablet Take 300 mg by mouth at bedtime.     Marland Kitchen aspirin 81 MG chewable tablet Chew 81 mg by mouth at bedtime.     Marland Kitchen atorvastatin (LIPITOR) 40 MG tablet Take 1 tablet (40 mg total) daily by mouth. (Patient taking differently: Take 40 mg by mouth daily at 6 PM. ) 90 tablet 3  . benazepril (LOTENSIN) 5 MG tablet Take 5 mg by mouth at bedtime.    . nitroGLYCERIN (NITROSTAT) 0.4 MG SL tablet Place 0.4 mg under the tongue every 5 (five) minutes as needed for chest pain.     Marland Kitchen omeprazole (PRILOSEC) 40 MG capsule Take 1 capsule (40 mg total) by mouth 2 (two) times daily before a meal. 60 capsule 2   No current facility-administered medications for this visit.     Review of Systems Review of Systems  Constitutional: Negative.   Respiratory: Negative.   Cardiovascular: Negative.     Blood pressure 130/72, pulse 74, resp. rate 14, height 5\' 11"  (1.803 m), weight 206 lb (93.4 kg).  Physical Exam Physical Exam  Constitutional: He is oriented to person, place, and time. He appears well-developed and well-nourished.  Abdominal: No hernia.  Umbilical hernia repair is intact and well healed.   Neurological: He is alert and oriented to person, place, and  time.  Skin: Skin is warm and dry.    Assessment    Doing well post umbilical hernia repair.    Plan    Exercise routine progression reviewed.    Proper lifting techniques reviewed. Return as needed.   HPI, Physical Exam, Assessment and Plan have been scribed under the direction and in the presence of Hervey Ard, MD.  Gaspar Cola, CMA  I have completed the exam and reviewed the above documentation for accuracy and completeness.  I agree with the above.  Haematologist has been used and any errors in dictation or transcription are unintentional.  Hervey Ard, M.D., F.A.C.S.  Kevin Melton 03/07/2017, 11:35 AM

## 2017-03-06 NOTE — Patient Instructions (Signed)
Proper lifting techniques reviewed. Return as needed.

## 2017-03-07 ENCOUNTER — Encounter: Payer: Self-pay | Admitting: General Surgery

## 2017-03-21 ENCOUNTER — Ambulatory Visit (INDEPENDENT_AMBULATORY_CARE_PROVIDER_SITE_OTHER): Payer: PPO | Admitting: Family Medicine

## 2017-03-21 ENCOUNTER — Encounter: Payer: Self-pay | Admitting: Family Medicine

## 2017-03-21 ENCOUNTER — Other Ambulatory Visit: Payer: Self-pay | Admitting: Family Medicine

## 2017-03-21 VITALS — BP 152/78 | HR 71 | Temp 98.0°F | Resp 16 | Ht 71.0 in | Wt 213.0 lb

## 2017-03-21 DIAGNOSIS — N1831 Chronic kidney disease, stage 3a: Secondary | ICD-10-CM | POA: Insufficient documentation

## 2017-03-21 DIAGNOSIS — N183 Chronic kidney disease, stage 3 unspecified: Secondary | ICD-10-CM

## 2017-03-21 DIAGNOSIS — Z1211 Encounter for screening for malignant neoplasm of colon: Secondary | ICD-10-CM

## 2017-03-21 DIAGNOSIS — N182 Chronic kidney disease, stage 2 (mild): Secondary | ICD-10-CM | POA: Insufficient documentation

## 2017-03-21 DIAGNOSIS — Z0000 Encounter for general adult medical examination without abnormal findings: Secondary | ICD-10-CM

## 2017-03-21 DIAGNOSIS — N189 Chronic kidney disease, unspecified: Secondary | ICD-10-CM

## 2017-03-21 NOTE — Patient Instructions (Signed)
Preventive Care 65 Years and Older, Male Preventive care refers to lifestyle choices and visits with your health care provider that can promote health and wellness. What does preventive care include?  A yearly physical exam. This is also called an annual well check.  Dental exams once or twice a year.  Routine eye exams. Ask your health care provider how often you should have your eyes checked.  Personal lifestyle choices, including: ? Daily care of your teeth and gums. ? Regular physical activity. ? Eating a healthy diet. ? Avoiding tobacco and drug use. ? Limiting alcohol use. ? Practicing safe sex. ? Taking low doses of aspirin every day. ? Taking vitamin and mineral supplements as recommended by your health care provider. What happens during an annual well check? The services and screenings done by your health care provider during your annual well check will depend on your age, overall health, lifestyle risk factors, and family history of disease. Counseling Your health care provider may ask you questions about your:  Alcohol use.  Tobacco use.  Drug use.  Emotional well-being.  Home and relationship well-being.  Sexual activity.  Eating habits.  History of falls.  Memory and ability to understand (cognition).  Work and work environment.  Screening You may have the following tests or measurements:  Height, weight, and BMI.  Blood pressure.  Lipid and cholesterol levels. These may be checked every 5 years, or more frequently if you are over 50 years old.  Skin check.  Lung cancer screening. You may have this screening every year starting at age 55 if you have a 30-pack-year history of smoking and currently smoke or have quit within the past 15 years.  Fecal occult blood test (FOBT) of the stool. You may have this test every year starting at age 50.  Flexible sigmoidoscopy or colonoscopy. You may have a sigmoidoscopy every 5 years or a colonoscopy every 10  years starting at age 50.  Prostate cancer screening. Recommendations will vary depending on your family history and other risks.  Hepatitis C blood test.  Hepatitis B blood test.  Sexually transmitted disease (STD) testing.  Diabetes screening. This is done by checking your blood sugar (glucose) after you have not eaten for a while (fasting). You may have this done every 1-3 years.  Abdominal aortic aneurysm (AAA) screening. You may need this if you are a current or former smoker.  Osteoporosis. You may be screened starting at age 70 if you are at high risk.  Talk with your health care provider about your test results, treatment options, and if necessary, the need for more tests. Vaccines Your health care provider may recommend certain vaccines, such as:  Influenza vaccine. This is recommended every year.  Tetanus, diphtheria, and acellular pertussis (Tdap, Td) vaccine. You may need a Td booster every 10 years.  Varicella vaccine. You may need this if you have not been vaccinated.  Zoster vaccine. You may need this after age 60.  Measles, mumps, and rubella (MMR) vaccine. You may need at least one dose of MMR if you were born in 1957 or later. You may also need a second dose.  Pneumococcal 13-valent conjugate (PCV13) vaccine. One dose is recommended after age 65.  Pneumococcal polysaccharide (PPSV23) vaccine. One dose is recommended after age 65.  Meningococcal vaccine. You may need this if you have certain conditions.  Hepatitis A vaccine. You may need this if you have certain conditions or if you travel or work in places where you   may be exposed to hepatitis A.  Hepatitis B vaccine. You may need this if you have certain conditions or if you travel or work in places where you may be exposed to hepatitis B.  Haemophilus influenzae type b (Hib) vaccine. You may need this if you have certain risk factors.  Talk to your health care provider about which screenings and vaccines  you need and how often you need them. This information is not intended to replace advice given to you by your health care provider. Make sure you discuss any questions you have with your health care provider. Document Released: 03/20/2015 Document Revised: 11/11/2015 Document Reviewed: 12/23/2014 Elsevier Interactive Patient Education  2018 [employer]  

## 2017-03-21 NOTE — Progress Notes (Signed)
Patient: Kevin Melton, Male    DOB: [birth year], 67 y.o.   MRN: [id number] Visit Date: 03/21/2017  Today's Provider: Lavon Paganini, MD   Chief Complaint  Patient presents with  . Medicare Wellness   Subjective:    Initial Annual wellness visit  Kevin Melton is a 67 y.o. male. He feels well. He reports exercising none, but active lifestyle. He reports he is sleeping well.  Last colonoscopy- 5 years ago in [place]  -----------------------------------------------------------   Review of Systems  Constitutional: Negative.   HENT: Negative.   Eyes: Negative.   Respiratory: Negative.   Cardiovascular: Negative.   Gastrointestinal: Negative.   Endocrine: Negative.   Genitourinary: Negative.   Musculoskeletal: Negative.   Skin: Negative.   Allergic/Immunologic: Negative.   Neurological: Negative.   Hematological: Negative.   Psychiatric/Behavioral: Negative.     Social History   Socioeconomic History  . Marital status: Married    Spouse name: Kevin Melton  . Number of children: 1  . Years of education: 18  . Highest education level: High school graduate  Social Needs  . Financial resource strain: Not hard at all  . Food insecurity - worry: Never true  . Food insecurity - inability: Never true  . Transportation needs - medical: No  . Transportation needs - non-medical: No  Occupational History  . Occupation: Self Employed    Comment: [employer] Use  . Smoking status: Never Smoker  . Smokeless tobacco: Never Used  Substance and Sexual Activity  . Alcohol use: No  . Drug use: No  . Sexual activity: Yes    Partners: Female  Other Topics Concern  . Not on file  Social History Narrative  . Not on file    Past Medical History:  Diagnosis Date  . Abdominal hernia    umbilical  . Chronic kidney disease (CKD), active medical management without dialysis   . Chronic kidney disease, stage III (moderate) (HCC)     . Coronary artery disease   . GERD (gastroesophageal reflux disease)   . History of kidney stones    h/o  . Kidney stones   . MI (myocardial infarction) [facility]) 2013   1 stent placed-sees Dr Estrella Myrtle) last seen in nov 2018     Patient Active Problem List   Diagnosis Date Noted  . CKD (chronic kidney disease) 03/21/2017  . Coronary artery disease involving native coronary artery of native heart without angina pectoris 01/12/2017  . Erosive esophagitis   . H/O acute myocardial infarction 11/10/2016  . Dysphagia 11/10/2016  . Umbilical hernia 12/45/8099  . Essential hypertension with goal blood pressure less than 130/80 09/04/2014  . Mixed hyperlipidemia 09/04/2014  . Gout 11/05/2013  . Midline thoracic back pain 11/05/2013    Past Surgical History:  Procedure Laterality Date  . COLONOSCOPY  2013  . CORONARY ANGIOPLASTY WITH STENT PLACEMENT  2013   [place]  . ESOPHAGOGASTRODUODENOSCOPY (EGD) WITH PROPOFOL N/A 01/05/2017   Procedure: ESOPHAGOGASTRODUODENOSCOPY (EGD) WITH PROPOFOL With Dilation;  Surgeon: Lin Landsman, MD;  Location: [facility] ENDOSCOPY;  Service: Gastroenterology;  Laterality: N/A;  . LITHOTRIPSY  2014  . UMBILICAL HERNIA REPAIR N/A 02/20/2017   Primary repair 2 cm umbilical hernia;  Surgeon: Robert Bellow, MD;  Location: [facility] ORS;  Service: General;  Laterality: N/A;    His family history includes Dementia (age of onset: 109) in his mother; Healthy in his brother, sister, sister, and son; Heart Problems in  his brother; Lung disease (age of onset: 18) in his father.      Current Outpatient Medications:  .  allopurinol (ZYLOPRIM) 300 MG tablet, Take 300 mg by mouth at bedtime. , Disp: , Rfl:  .  aspirin 81 MG chewable tablet, Chew 81 mg by mouth at bedtime. , Disp: , Rfl:  .  atorvastatin (LIPITOR) 40 MG tablet, Take 1 tablet (40 mg total) daily by mouth. (Patient taking differently: Take 40 mg by mouth daily at 6 PM. ), Disp: 90 tablet, Rfl:  3 .  benazepril (LOTENSIN) 5 MG tablet, Take 5 mg by mouth at bedtime., Disp: , Rfl:  .  nitroGLYCERIN (NITROSTAT) 0.4 MG SL tablet, Place 0.4 mg under the tongue every 5 (five) minutes as needed for chest pain. , Disp: , Rfl:  .  omeprazole (PRILOSEC) 40 MG capsule, Take 1 capsule (40 mg total) by mouth 2 (two) times daily before a meal., Disp: 60 capsule, Rfl: 2  Patient Care Team: Virginia Crews, MD as PCP - General (Family Medicine) Brita Romp, Dionne Bucy, MD as Consulting Physician (Family Medicine) Bary Castilla, Forest Gleason, MD (General Surgery)     Objective:   Vitals: BP (!) 152/78 (BP Location: Left Arm, Patient Position: Sitting, Cuff Size: Large)   Pulse 71   Temp 98 F (36.7 C) (Oral)   Resp 16   Ht 5\' 11"  (1.803 m)   Wt 213 lb (96.6 kg)   SpO2 96%   BMI 29.71 kg/m   Physical Exam  Constitutional: He is oriented to person, place, and time. He appears well-developed and well-nourished. No distress.  HENT:  Head: Normocephalic and atraumatic.  Eyes: Conjunctivae and EOM are normal. No scleral icterus.  Neck: Neck supple. No thyromegaly present.  Cardiovascular: Normal rate, regular rhythm, normal heart sounds and intact distal pulses.  No murmur heard. Pulmonary/Chest: Effort normal and breath sounds normal. No respiratory distress. He has no wheezes. He has no rales.  Abdominal: Soft. He exhibits no distension. There is no tenderness.  Musculoskeletal: He exhibits no edema or deformity.  Lymphadenopathy:    He has no cervical adenopathy.  Neurological: He is alert and oriented to person, place, and time.  Skin: Skin is warm and dry. No rash noted.  Psychiatric: He has a normal mood and affect. His behavior is normal.  Vitals reviewed.   Activities of Daily Living In your present state of health, do you have any difficulty performing the following activities: 03/21/2017 02/10/2017  Hearing? Y N  Vision? N N  Difficulty concentrating or making decisions? N N   Walking or climbing stairs? N N  Dressing or bathing? N N  Doing errands, shopping? N N  Some recent data might be hidden    Fall Risk Assessment Fall Risk  03/21/2017 11/10/2016  Falls in the past year? No No     Depression Screen PHQ 2/9 Scores 03/21/2017 11/10/2016  PHQ - 2 Score 0 0    Cognitive Testing - 6-CIT  Correct? Score   What year is it? yes 0 0 or 4  What month is it? yes 0 0 or 3  Memorize:    Pia Mau,  [birth year],  [place],      What time is it? (within 1 hour) yes 0 0 or 3  Count backwards from 20 yes 0 0, 2, or 4  Name the months of the year yes 0 0, 2, or 4  Repeat name & address above yes 0  0, 2, 4, 6, 8, or 10       TOTAL SCORE  0/28   Interpretation:  Normal  Normal (0-7) Abnormal (8-28)    Audit-C Alcohol Use Screening  Question Answer Points  How often do you have alcoholic drink? never 0  On days you do drink alcohol, how many drinks do you typically consume? 0 0  How oftey will you drink 6 or more in a total? never 0  Total Score:  0   A score of 3 or more in women, and 4 or more in men indicates increased risk for alcohol abuse, EXCEPT if all of the points are from question 1.     Assessment & Plan:     Annual Wellness Visit  Reviewed patient's Family Medical History Reviewed and updated list of patient's medical providers Assessment of cognitive impairment was done Assessed patient's functional ability Established a written schedule for health screening [place] Completed and Reviewed  Exercise Activities and Dietary recommendations Goals    None      Immunization History  Administered Date(s) Administered  . Influenza, High Dose Seasonal PF 11/10/2016  . Pneumococcal Conjugate-13 11/10/2016    Health Maintenance  Topic Date Due  . TETANUS/TDAP  10/22/1970  . COLONOSCOPY  10/21/2001  . PNA vac Low Risk Adult (2 of 2 - PPSV23) 11/10/2017  . INFLUENZA VACCINE  Completed  . Hepatitis C  Screening  Completed  . HIV Screening  Completed     Discussed health benefits of physical activity, and encouraged him to engage in regular exercise appropriate for his age and condition.    Unable to obtain colonoscopy records (unknown what doctor performed).  Reports normal colonoscopies at age 45 and 86.  Will do Cologuard today to tide over to next colonoscopy. Declines TDAP today BP elevated today but has had stressful AM. Home readings well controlled.  Problem List Items Addressed This Visit    None    Visit Diagnoses    Welcome to Medicare preventive visit    -  Primary   Relevant Orders   EKG 12-Lead (Completed)   Colon cancer screening       Relevant Orders   Cologuard      ------------------------------------------------------------------------------------------------------------ Return in about 6 months (around 09/18/2017) for chronic disease.   The entirety of the information documented in the History of Present Illness, Review of Systems and Physical Exam were personally obtained by me. Portions of this information were initially documented by Raquel Sarna Ratchford, CMA and reviewed by me for thoroughness and accuracy.    Virginia Crews, MD, MPH [facility] 03/21/2017 11:40 AM

## 2017-03-22 ENCOUNTER — Telehealth: Payer: Self-pay | Admitting: Family Medicine

## 2017-03-22 NOTE — Telephone Encounter (Signed)
Order for cologuard faxed to [facility] °

## 2017-03-23 NOTE — Addendum Note (Signed)
Addended by: Virginia Crews on: 03/23/2017 10:20 AM   Modules accepted: Level of Service

## 2017-03-28 DIAGNOSIS — Z1211 Encounter for screening for malignant neoplasm of colon: Secondary | ICD-10-CM | POA: Diagnosis not present

## 2017-04-06 LAB — COLOGUARD: Cologuard: NEGATIVE

## 2017-04-07 DIAGNOSIS — I10 Essential (primary) hypertension: Secondary | ICD-10-CM | POA: Diagnosis not present

## 2017-04-07 DIAGNOSIS — N183 Chronic kidney disease, stage 3 (moderate): Secondary | ICD-10-CM | POA: Diagnosis not present

## 2017-08-07 ENCOUNTER — Telehealth: Payer: Self-pay | Admitting: Family Medicine

## 2017-08-07 MED ORDER — SCOPOLAMINE 1 MG/3DAYS TD PT72: 1.0000 | MEDICATED_PATCH | TRANSDERMAL | 1 refills | Status: DC

## 2017-08-07 NOTE — Telephone Encounter (Signed)
Patient is going on a deep sea fishing trip for one day on 08/23/2017 and would like to know if you will give him patches behind his ear for motion sickness.  He uses [facility] in [place].  Please advise.

## 2017-08-07 NOTE — Telephone Encounter (Signed)
Please review

## 2017-08-07 NOTE — Telephone Encounter (Signed)
Rx sent for 1 scopolamine patch.  This is good for 3 days.  Let me know if he needs more patches per Rx.  Virginia Crews, MD, MPH [facility] 08/07/2017 4:50 PM

## 2017-08-08 NOTE — Telephone Encounter (Signed)
Tried calling pt. NA and VM has not been set up.

## 2017-09-18 ENCOUNTER — Ambulatory Visit (INDEPENDENT_AMBULATORY_CARE_PROVIDER_SITE_OTHER): Payer: PPO | Admitting: Family Medicine

## 2017-09-18 ENCOUNTER — Encounter: Payer: Self-pay | Admitting: Family Medicine

## 2017-09-18 VITALS — BP 142/70 | HR 56 | Temp 98.1°F | Resp 20 | Wt 202.0 lb

## 2017-09-18 DIAGNOSIS — L821 Other seborrheic keratosis: Secondary | ICD-10-CM | POA: Diagnosis not present

## 2017-09-18 DIAGNOSIS — N183 Chronic kidney disease, stage 3 unspecified: Secondary | ICD-10-CM

## 2017-09-18 DIAGNOSIS — I10 Essential (primary) hypertension: Secondary | ICD-10-CM | POA: Diagnosis not present

## 2017-09-18 MED ORDER — BENAZEPRIL HCL 20 MG PO TABS: 20.0000 mg | ORAL_TABLET | Freq: Every day | ORAL | 3 refills | Status: DC

## 2017-09-18 NOTE — Assessment & Plan Note (Signed)
Reassured patient that these do not represent any malignant process

## 2017-09-18 NOTE — Patient Instructions (Signed)
Seborrheic Keratosis  Seborrheic keratosis is a common, noncancerous (benign) skin growth. This condition causes waxy, rough, tan, brown, or black spots to appear on the skin. These skin growths can be flat or raised.  What are the causes?  The cause of this condition is not known.  What increases the risk?  This condition is more likely to develop in:  · People who have a family history of seborrheic keratosis.  · People who are 50 or older.  · People who are pregnant.  · People who have had estrogen replacement therapy.    What are the signs or symptoms?  This condition often occurs on the face, chest, shoulders, back, or other areas. These growths:  · Are usually painless, but may become irritated and itchy.  · Can be yellow, brown, black, or other colors.  · Are slightly raised or have a flat surface.  · Are sometimes rough or wart-like in texture.  · Are often waxy on the surface.  · Are round or oval-shaped.  · Sometimes look like they are "stuck on.”  · Often occur in groups, but may occur as a single growth.    How is this diagnosed?  This condition is diagnosed with a medical history and physical exam. A sample of the growth may be tested (skin biopsy). You may need to see a skin specialist (dermatologist).  How is this treated?  Treatment is not usually needed for this condition, unless the growths are irritated or are often bleeding. You may also choose to have the growths removed if you do not like their appearance. Most commonly, these growths are treated with a procedure in which liquid nitrogen is applied to “freeze” off the growth (cryosurgery). They may also be burned off with electricity or cut off.  Follow these instructions at home:  · Watch your growth for any changes.  · Keep all follow-up visits as told by your health care provider. This is important.  · Do not scratch or pick at the growth or growths. This can cause them to become irritated or infected.  Contact a health care provider  if:  · You suddenly have many new growths.  · Your growth bleeds, itches, or hurts.  · Your growth suddenly becomes larger or changes color.  This information is not intended to replace advice given to you by your health care provider. Make sure you discuss any questions you have with your health care provider.  Document Released: 03/26/2010 Document Revised: 07/30/2015 Document Reviewed: 07/09/2014  Elsevier Interactive Patient Education © 2018 Elsevier Inc[employer]

## 2017-09-18 NOTE — Assessment & Plan Note (Signed)
Followed by Dr. Candiss Norse with nephrology We will send ROI for records Advised patient to take BMP from today with him to next nephrology appointment later this month

## 2017-09-18 NOTE — Progress Notes (Signed)
Patient: Kevin Melton Male    DOB: [birth year]   67 y.o.   MRN: [id number] Visit Date: 09/18/2017  Today's Provider: Lavon Paganini, MD   I, Martha Clan, CMA, am acting as scribe for Lavon Paganini, MD.  Chief Complaint  Patient presents with  . Hypertension   Subjective:    HPI      Hypertension, follow-up:  BP Readings from Last 3 Encounters:  09/18/17 (!) 142/70  03/21/17 (!) 152/78  03/06/17 130/72    He was last seen for hypertension 6 months ago.  BP at that visit was 152/78. Management since that visit includes continuing benazepril 5 mg. Believes that Nephrologist increased benazepril from 5mg  to 10mg  daily several months ago. He reports good compliance with treatment. He is not having side effects.  He states he stays active, but no formal exercise. He is adherent to low salt diet.   Outside blood pressures are not being checked. He is experiencing none.  Patient denies chest pain, chest pressure/discomfort, claudication, dyspnea, exertional chest pressure/discomfort, fatigue, irregular heart beat, lower extremity edema, near-syncope, orthopnea, palpitations and syncope.   Cardiovascular risk factors include advanced age (older than 66 for men, 97 for women), dyslipidemia, hypertension, male gender and H/O MI and atherosclerotic heart disease.  Use of agents associated with hypertension: none.     Weight trend: fluctuating a bit Wt Readings from Last 3 Encounters:  09/18/17 202 lb (91.6 kg)  03/21/17 213 lb (96.6 kg)  03/06/17 206 lb (93.4 kg)    Current diet: in general, a "healthy" diet   He states he eats a diabetes friendly diet, due to his wife's DM.   He states he has a few moles that he would like looked at today.  They are not bothersome and have not grown/changed. ------------------------------------------------------------------------   No Known Allergies   Current Outpatient Medications:  .  allopurinol (ZYLOPRIM)  300 MG tablet, Take 300 mg by mouth at bedtime. , Disp: , Rfl:  .  aspirin 81 MG chewable tablet, Chew 81 mg by mouth at bedtime. , Disp: , Rfl:  .  atorvastatin (LIPITOR) 40 MG tablet, Take 1 tablet (40 mg total) daily by mouth. (Patient taking differently: Take 40 mg by mouth daily at 6 PM. ), Disp: 90 tablet, Rfl: 3 .  benazepril (LOTENSIN) 20 MG tablet, Take 1 tablet (20 mg total) by mouth at bedtime., Disp: 90 tablet, Rfl: 3 .  nitroGLYCERIN (NITROSTAT) 0.4 MG SL tablet, Place 0.4 mg under the tongue every 5 (five) minutes as needed for chest pain. , Disp: , Rfl:   Review of Systems  Constitutional: Negative for activity change, appetite change, chills, diaphoresis, fatigue, fever and unexpected weight change.  Respiratory: Negative for shortness of breath.   Cardiovascular: Negative for chest pain, palpitations and leg swelling.    Social History   Tobacco Use  . Smoking status: Never Smoker  . Smokeless tobacco: Never Used  Substance Use Topics  . Alcohol use: No   Objective:   BP (!) 142/70 (BP Location: Left Arm, Patient Position: Sitting, Cuff Size: Large)   Pulse (!) 56   Temp 98.1 F (36.7 C) (Oral)   Resp 20   Wt 202 lb (91.6 kg)   BMI 28.17 kg/m  Vitals:   09/18/17 0804  BP: (!) 142/70  Pulse: (!) 56  Resp: 20  Temp: 98.1 F (36.7 C)  TempSrc: Oral  Weight: 202 lb (91.6 kg)     Physical  Exam  Constitutional: He is oriented to person, place, and time. He appears well-developed and well-nourished. No distress.  HENT:  Head: Normocephalic and atraumatic.  Eyes: Conjunctivae are normal. No scleral icterus.  Neck: Neck supple. No thyromegaly present.  Cardiovascular: Normal rate, regular rhythm, normal heart sounds and intact distal pulses.  No murmur heard. Pulmonary/Chest: Effort normal and breath sounds normal. No respiratory distress. He has no wheezes. He has no rales.  Musculoskeletal: He exhibits no edema.  Lymphadenopathy:    He has no cervical  adenopathy.  Neurological: He is alert and oriented to person, place, and time.  Skin: Skin is warm and dry. Capillary refill takes less than 2 seconds. No rash noted. No erythema.  Multiple raised hyperpigmented SKs on arms and neck  Psychiatric: He has a normal mood and affect. His behavior is normal.  Vitals reviewed.      Assessment & Plan:   Problem List Items Addressed This Visit      Cardiovascular and Mediastinum   Essential hypertension with goal blood pressure less than 130/80 - Primary    Uncontrolled Increase benazepril to 20mg  daily Recheck BMP Follow-up in 3 months Patient has follow-up with nephrology at the end of the month No red flag symptoms Patient to call if blood pressure elevates further      Relevant Medications   benazepril (LOTENSIN) 20 MG tablet   Other Relevant Orders   Basic Metabolic Panel (BMET)     Musculoskeletal and Integument   Seborrheic keratoses    Reassured patient that these do not represent any malignant process        Genitourinary   CKD (chronic kidney disease)    Followed by Dr. Candiss Norse with nephrology We will send ROI for records Advised patient to take BMP from today with him to next nephrology appointment later this month      Relevant Orders   Basic Metabolic Panel (BMET)       Return in about 3 months (around 12/19/2017) for BP f/u.   The entirety of the information documented in the History of Present Illness, Review of Systems and Physical Exam were personally obtained by me. Portions of this information were initially documented by Raquel Sarna Ratchford, CMA and reviewed by me for thoroughness and accuracy.    Virginia Crews, MD, MPH [facility] 09/18/2017 9:07 AM

## 2017-09-18 NOTE — Assessment & Plan Note (Addendum)
Uncontrolled Increase benazepril to 20mg  daily Recheck BMP Follow-up in 3 months Patient has follow-up with nephrology at the end of the month No red flag symptoms Patient to call if blood pressure elevates further

## 2017-09-19 LAB — BASIC METABOLIC PANEL
BUN / CREAT RATIO: 17 (ref 10–24)
BUN: 20 mg/dL (ref 8–27)
CHLORIDE: 105 mmol/L (ref 96–106)
CO2: 23 mmol/L (ref 20–29)
Calcium: 8.8 mg/dL (ref 8.6–10.2)
Creatinine, Ser: 1.21 mg/dL (ref 0.76–1.27)
GFR calc non Af Amer: 62 mL/min/{1.73_m2} (ref >59)
GFR, EST AFRICAN AMERICAN: 72 mL/min/{1.73_m2} (ref >59)
GLUCOSE: 95 mg/dL (ref 65–99)
POTASSIUM: 4.6 mmol/L (ref 3.5–5.2)
SODIUM: 139 mmol/L (ref 134–144)

## 2017-09-20 ENCOUNTER — Telehealth: Payer: Self-pay

## 2017-09-20 NOTE — Telephone Encounter (Signed)
-----   Message from Virginia Crews, MD sent at 09/19/2017  9:48 AM EDT ----- Kidney function is slightly improved.  Normal electrolytes.  Virginia Crews, MD, MPH [facility] 09/19/2017 9:48 AM

## 2017-09-20 NOTE — Telephone Encounter (Signed)
Tried calling pt. NA and VM not set up.

## 2017-09-21 NOTE — Telephone Encounter (Signed)
Tried calling pt. NA and VM was full.

## 2017-09-22 NOTE — Telephone Encounter (Signed)
Pt advised.   Thanks,   -Laura  

## 2017-10-11 DIAGNOSIS — I10 Essential (primary) hypertension: Secondary | ICD-10-CM | POA: Diagnosis not present

## 2017-12-19 ENCOUNTER — Ambulatory Visit (INDEPENDENT_AMBULATORY_CARE_PROVIDER_SITE_OTHER): Payer: PPO | Admitting: Family Medicine

## 2017-12-19 ENCOUNTER — Encounter: Payer: Self-pay | Admitting: Family Medicine

## 2017-12-19 VITALS — BP 140/60 | HR 53 | Temp 97.9°F | Resp 16 | Wt 210.0 lb

## 2017-12-19 DIAGNOSIS — I2510 Atherosclerotic heart disease of native coronary artery without angina pectoris: Secondary | ICD-10-CM | POA: Diagnosis not present

## 2017-12-19 DIAGNOSIS — I10 Essential (primary) hypertension: Secondary | ICD-10-CM

## 2017-12-19 DIAGNOSIS — Z23 Encounter for immunization: Secondary | ICD-10-CM | POA: Diagnosis not present

## 2017-12-19 MED ORDER — AMLODIPINE BESYLATE 5 MG PO TABS: 5.0000 mg | ORAL_TABLET | Freq: Every day | ORAL | 3 refills | Status: DC

## 2017-12-19 NOTE — Progress Notes (Signed)
Patient: Kevin Melton Male    DOB: [birth year]   67 y.o.   MRN: [id number] Visit Date: 12/19/2017  Today's Provider: Lavon Paganini, MD   Chief Complaint  Patient presents with  . Hypertension   Subjective:    HPI      Hypertension, follow-up:  BP Readings from Last 3 Encounters:  12/19/17 140/60  09/18/17 (!) 142/70  03/21/17 (!) 152/78    He was last seen for hypertension 3 months ago.  BP at that visit was 142/70. Management changes since that visit include patient states that he was started on Amlodipine 2.5mg  by his kidney specialist. Continues on benazepril 20mg  daily He reports excellent compliance with treatment. He is not having side effects.  He is not exercising. He is adherent to low salt diet.   Outside blood pressures are not being checked. He is experiencing none.  Patient denies chest pain, chest pressure/discomfort, dyspnea, exertional chest pressure/discomfort, fatigue, irregular heart beat, lower extremity edema, near-syncope, orthopnea, palpitations, paroxysmal nocturnal dyspnea, syncope and tachypnea.   Cardiovascular risk factors include advanced age (older than 50 for men, 81 for women), hypertension and male gender.  Use of agents associated with hypertension: NSAIDS.     Weight trend: increasing steadily Wt Readings from Last 3 Encounters:  12/19/17 210 lb (95.3 kg)  09/18/17 202 lb (91.6 kg)  03/21/17 213 lb (96.6 kg)    Current diet: low salt  ------------------------------------------------------------------------   No Known Allergies   Current Outpatient Medications:  .  allopurinol (ZYLOPRIM) 300 MG tablet, Take 300 mg by mouth at bedtime. , Disp: , Rfl:  .  amLODipine (NORVASC) 2.5 MG tablet, Take 2.5 mg by mouth daily., Disp: , Rfl: 11 .  aspirin 81 MG chewable tablet, Chew 81 mg by mouth at bedtime. , Disp: , Rfl:  .  benazepril (LOTENSIN) 20 MG tablet, Take 1 tablet (20 mg total) by mouth at bedtime., Disp:  90 tablet, Rfl: 3 .  nitroGLYCERIN (NITROSTAT) 0.4 MG SL tablet, Place 0.4 mg under the tongue every 5 (five) minutes as needed for chest pain. , Disp: , Rfl:  .  atorvastatin (LIPITOR) 40 MG tablet, Take 1 tablet (40 mg total) daily by mouth. (Patient taking differently: Take 40 mg by mouth daily at 6 PM. ), Disp: 90 tablet, Rfl: 3  Review of Systems  Constitutional: Negative.   HENT: Negative.   Eyes: Negative.   Respiratory: Negative.   Cardiovascular: Negative.   Gastrointestinal: Negative.   Endocrine: Negative.   Genitourinary: Negative.   Musculoskeletal: Negative.   Neurological: Negative.     Social History   Tobacco Use  . Smoking status: Never Smoker  . Smokeless tobacco: Never Used  Substance Use Topics  . Alcohol use: No   Objective:   BP 140/60   Pulse (!) 53   Temp 97.9 F (36.6 C) (Oral)   Resp 16   Wt 210 lb (95.3 kg)   SpO2 96%   BMI 29.29 kg/m  Vitals:   12/19/17 0841  BP: 140/60  Pulse: (!) 53  Resp: 16  Temp: 97.9 F (36.6 C)  TempSrc: Oral  SpO2: 96%  Weight: 210 lb (95.3 kg)     Physical Exam  Constitutional: He is oriented to person, place, and time. He appears well-developed and well-nourished. No distress.  HENT:  Head: Normocephalic and atraumatic.  Eyes: Conjunctivae are normal. No scleral icterus.  Neck: Neck supple. No thyromegaly present.  Cardiovascular: Normal rate, regular  rhythm, normal heart sounds and intact distal pulses.  No murmur heard. Pulmonary/Chest: Effort normal and breath sounds normal. No respiratory distress. He has no wheezes. He has no rales.  Musculoskeletal: He exhibits no edema.  Lymphadenopathy:    He has no cervical adenopathy.  Neurological: He is alert and oriented to person, place, and time.  Skin: Skin is warm and dry. Capillary refill takes less than 2 seconds. No rash noted.  Psychiatric: He has a normal mood and affect. His behavior is normal.  Vitals reviewed.       Assessment & Plan:     Problem List Items Addressed This Visit      Cardiovascular and Mediastinum   Essential hypertension with goal blood pressure less than 130/80 - Primary    Uncontrolled Continue benazepril 20mg  daily Increase amlodipine to 5mg  daily Reviewed recent BMP F/u in 3 months      Relevant Medications   amLODipine (NORVASC) 5 MG tablet   Coronary artery disease involving native coronary artery of native heart without angina pectoris    Followed annually by Cardiology  Doing well and asymptomatic Continue ASA, statin, and antihypertensives      Relevant Medications   amLODipine (NORVASC) 5 MG tablet    Other Visit Diagnoses    Need for influenza vaccination       Relevant Orders   Flu vaccine HIGH DOSE PF (Completed)       Return in about 3 months (around 03/21/2018) for AWV/CPE (same day).   The entirety of the information documented in the History of Present Illness, Review of Systems and Physical Exam were personally obtained by me. Portions of this information were initially documented by Hebert Soho, CMA and reviewed by me for thoroughness and accuracy.    Virginia Crews, MD, MPH [facility] 12/19/2017 9:16 AM

## 2017-12-19 NOTE — Assessment & Plan Note (Signed)
Followed annually by Cardiology  Doing well and asymptomatic Continue ASA, statin, and antihypertensives

## 2017-12-19 NOTE — Assessment & Plan Note (Signed)
Uncontrolled Continue benazepril 20mg  daily Increase amlodipine to 5mg  daily Reviewed recent BMP F/u in 3 months

## 2017-12-19 NOTE — Patient Instructions (Signed)

## 2017-12-23 ENCOUNTER — Emergency Department
Admission: EM | Admit: 2017-12-23 | Discharge: 2017-12-23 | Disposition: A | Discharge status: Home / Self Care | Payer: PPO | Attending: Emergency Medicine | Admitting: Emergency Medicine

## 2017-12-23 ENCOUNTER — Emergency Department: Payer: PPO

## 2017-12-23 ENCOUNTER — Other Ambulatory Visit: Payer: Self-pay

## 2017-12-23 ENCOUNTER — Encounter: Payer: Self-pay | Admitting: Emergency Medicine

## 2017-12-23 DIAGNOSIS — Z7982 Long term (current) use of aspirin: Secondary | ICD-10-CM | POA: Insufficient documentation

## 2017-12-23 DIAGNOSIS — J9811 Atelectasis: Secondary | ICD-10-CM | POA: Diagnosis not present

## 2017-12-23 DIAGNOSIS — K222 Esophageal obstruction: Secondary | ICD-10-CM

## 2017-12-23 DIAGNOSIS — Z79899 Other long term (current) drug therapy: Secondary | ICD-10-CM | POA: Insufficient documentation

## 2017-12-23 DIAGNOSIS — Y999 Unspecified external cause status: Secondary | ICD-10-CM | POA: Insufficient documentation

## 2017-12-23 DIAGNOSIS — Y929 Unspecified place or not applicable: Secondary | ICD-10-CM | POA: Insufficient documentation

## 2017-12-23 DIAGNOSIS — N183 Chronic kidney disease, stage 3 (moderate): Secondary | ICD-10-CM | POA: Diagnosis not present

## 2017-12-23 DIAGNOSIS — I129 Hypertensive chronic kidney disease with stage 1 through stage 4 chronic kidney disease, or unspecified chronic kidney disease: Secondary | ICD-10-CM | POA: Insufficient documentation

## 2017-12-23 DIAGNOSIS — Y9389 Activity, other specified: Secondary | ICD-10-CM | POA: Diagnosis not present

## 2017-12-23 DIAGNOSIS — T18128A Food in esophagus causing other injury, initial encounter: Secondary | ICD-10-CM | POA: Insufficient documentation

## 2017-12-23 DIAGNOSIS — X58XXXA Exposure to other specified factors, initial encounter: Secondary | ICD-10-CM | POA: Diagnosis not present

## 2017-12-23 MED ORDER — GLUCAGON HCL RDNA (DIAGNOSTIC) 1 MG IJ SOLR
1.0000 mg | Freq: Once | INTRAMUSCULAR | Status: AC
  Administered 2017-12-23: 1 mg via INTRAMUSCULAR
  Filled 2017-12-23: qty 1

## 2017-12-23 MED ORDER — FAMOTIDINE 20 MG PO TABS: 20.0000 mg | ORAL_TABLET | Freq: Two times a day (BID) | ORAL | 0 refills | Status: DC

## 2017-12-23 NOTE — ED Triage Notes (Signed)
Pt reports has a piece of roast beef stuck in throat. Hx of same.  Reports cannot pass liquids when tried to drink. Unlabored, denies SHOB.  VSS.

## 2017-12-23 NOTE — ED Notes (Signed)
Patient transported to X-ray 

## 2017-12-23 NOTE — ED Notes (Signed)
Dr. Cherylann Banas gave pt a cup of ginger ale.

## 2017-12-23 NOTE — ED Provider Notes (Signed)
[facility] Emergency Department Provider Note ____________________________________________   First MD Initiated Contact with Patient 12/23/17 1828     (approximate)  I have reviewed the triage vital signs and the nursing notes.   HISTORY  Chief Complaint Foreign Body    HPI Kevin Melton is a 67 y.o. male with PMH as noted below including prior history of esophageal food impaction and esophagitis who presents with concern for esophageal foreign body, acute onset within the last hour, and occurring after he ate roast beef.  The patient states that when he tries to drink liquids they immediately come back up, and he is not able to tolerate his saliva.  He reports some discomfort in his lower chest and upper abdomen, but denies acute pain.  He was in his usual state of health until this afternoon.  Past Medical History:  Diagnosis Date  . Abdominal hernia    umbilical  . Chronic kidney disease (CKD), active medical management without dialysis   . Chronic kidney disease, stage III (moderate) (HCC)   . Coronary artery disease   . GERD (gastroesophageal reflux disease)   . History of kidney stones    h/o  . Kidney stones   . MI (myocardial infarction) [facility]) 2013   1 stent placed-sees Dr Estrella Myrtle) last seen in nov 2018    Patient Active Problem List   Diagnosis Date Noted  . Seborrheic keratoses 09/18/2017  . CKD (chronic kidney disease) 03/21/2017  . Coronary artery disease involving native coronary artery of native heart without angina pectoris 01/12/2017  . Erosive esophagitis   . H/O acute myocardial infarction 11/10/2016  . Dysphagia 11/10/2016  . Umbilical hernia 22/97/9892  . Essential hypertension with goal blood pressure less than 130/80 09/04/2014  . Mixed hyperlipidemia 09/04/2014  . Gout 11/05/2013    Past Surgical History:  Procedure Laterality Date  . COLONOSCOPY  2013  . CORONARY ANGIOPLASTY WITH STENT PLACEMENT   2013   [place]  . ESOPHAGOGASTRODUODENOSCOPY (EGD) WITH PROPOFOL N/A 01/05/2017   Procedure: ESOPHAGOGASTRODUODENOSCOPY (EGD) WITH PROPOFOL With Dilation;  Surgeon: Lin Landsman, MD;  Location: [facility] ENDOSCOPY;  Service: Gastroenterology;  Laterality: N/A;  . LITHOTRIPSY  2014  . UMBILICAL HERNIA REPAIR N/A 02/20/2017   Primary repair 2 cm umbilical hernia;  Surgeon: Robert Bellow, MD;  Location: [facility] ORS;  Service: General;  Laterality: N/A;    Prior to Admission medications   Medication Sig Start Date End Date Taking? Authorizing Provider  allopurinol (ZYLOPRIM) 300 MG tablet Take 300 mg by mouth at bedtime.  03/21/16   [provider]  amLODipine (NORVASC) 5 MG tablet Take 1 tablet (5 mg total) by mouth daily. 12/19/17   Virginia Crews, MD  aspirin 81 MG chewable tablet Chew 81 mg by mouth at bedtime.     [provider]  atorvastatin (LIPITOR) 40 MG tablet Take 1 tablet (40 mg total) daily by mouth. Patient taking differently: Take 40 mg by mouth daily at 6 PM.  01/11/17 09/18/17  End, Harrell Gave, MD  benazepril (LOTENSIN) 20 MG tablet Take 1 tablet (20 mg total) by mouth at bedtime. 09/18/17   Virginia Crews, MD  famotidine (PEPCID) 20 MG tablet Take 1 tablet (20 mg total) by mouth 2 (two) times daily for 15 days. 12/23/17 01/07/18  Arta Silence, MD  nitroGLYCERIN (NITROSTAT) 0.4 MG SL tablet Place 0.4 mg under the tongue every 5 (five) minutes as needed for chest pain.  03/11/15   [provider]    Allergies Patient has no known allergies.  Family History  Problem Relation Age of Onset  . Dementia Mother 1  . Lung disease Father 39       collapsed lung  . Healthy Sister   . Healthy Brother   . Heart Problems Brother   . Healthy Son   . Healthy Sister   . Colon cancer Neg Hx   . Prostate cancer Neg Hx   . Heart disease Neg Hx     Social History Social History   Tobacco Use  . Smoking status: Never Smoker  .  Smokeless tobacco: Never Used  Substance Use Topics  . Alcohol use: No  . Drug use: No    Review of Systems  Constitutional: No fever. Eyes: No redness. ENT: No sore throat. Cardiovascular: Denies chest pain. Respiratory: Denies shortness of breath. Gastrointestinal: No vomiting.  Genitourinary: Negative for flank pain.  Musculoskeletal: Negative for back pain. Skin: Negative for rash. Neurological: Negative for headache.   ____________________________________________   PHYSICAL EXAM:  VITAL SIGNS: ED Triage Vitals  Enc Vitals Group     BP 12/23/17 1817 (!) 151/71     Pulse Rate 12/23/17 1817 78     Resp 12/23/17 1817 18     Temp 12/23/17 1817 98.3 F (36.8 C)     Temp Source 12/23/17 1817 Oral     SpO2 12/23/17 1817 97 %     Weight 12/23/17 1817 200 lb (90.7 kg)     Height 12/23/17 1817 5\' 9"  (1.753 m)     Head Circumference --      Peak Flow --      Pain Score 12/23/17 1816 0     Pain Loc --      Pain Edu? --      Excl. in [place]? --     Constitutional: Alert and oriented. Well appearing and in no acute distress. Eyes: Conjunctivae are normal.  Head: Atraumatic. Nose: No congestion/rhinnorhea. Mouth/Throat: Mucous membranes are moist.  Oropharynx clear with no pooled secretions or swelling. Neck: Normal range of motion.  Cardiovascular: Normal rate, regular rhythm. Grossly normal heart sounds.  Good peripheral circulation. Respiratory: Normal respiratory effort.  No retractions. Lungs CTAB. Gastrointestinal: No distention.  Musculoskeletal:  Extremities warm and well perfused.  Neurologic:  Normal speech and language. No gross focal neurologic deficits are appreciated.  Skin:  Skin is warm and dry. No rash noted. Psychiatric: Mood and affect are normal. Speech and behavior are normal.  ____________________________________________   LABS (all labs ordered are listed, but only abnormal results are displayed)  Labs Reviewed - No data to  display ____________________________________________  EKG   ____________________________________________  RADIOLOGY  CXR: Air-fluid level in the esophagus, no other acute abnormalities  ____________________________________________   PROCEDURES  Procedure(s) performed: No  Procedures  Critical Care performed: No ____________________________________________   INITIAL IMPRESSION / ASSESSMENT AND PLAN / ED COURSE  Pertinent labs & imaging results that were available during my care of the patient were reviewed by me and considered in my medical decision making (see chart for details).  67 year old male presents with concern for esophageal food impaction which she has had before.  He reports discomfort in his lower chest and upper abdomen after eating a piece of rest beef, and states he cannot tolerate any liquids or solids.  On exam the patient is relatively comfortable, and is not tripoding.  His airway is intact.  His vitals are normal except for hypertension.  Presentation  is consistent with a food impaction.  I will give a dose of IM glucagon and obtain a chest x-ray.  If the symptoms resolved and the patient is tolerating p.o. he may go home.  If he continues to be unable to tolerate p.o. he will require GI evaluation and possible endoscopy.  ----------------------------------------- 7:52 PM on 12/23/2017 -----------------------------------------  X-ray showed air-fluid level in the esophagus consistent with esophageal food bolus.  The patient was given glucagon and after about 30 minutes he stated that the discomfort seemed to have resolved and he was now tolerating his saliva.  I gave him ginger ale and the patient was able to drink it without any regurgitation.  I observed him for another 20 minutes and he had no recurrent symptoms.  He feels comfortable and the impaction appears to have resolved.  He would like to go home.  He is stable for discharge at this time.  Return  precautions given, and he expresses understanding. ____________________________________________   FINAL CLINICAL IMPRESSION(S) / ED DIAGNOSES  Final diagnoses:  Esophageal obstruction due to food impaction      NEW MEDICATIONS STARTED DURING THIS VISIT:  New Prescriptions   FAMOTIDINE (PEPCID) 20 MG TABLET    Take 1 tablet (20 mg total) by mouth 2 (two) times daily for 15 days.     Note:  This document was prepared using Dragon voice recognition software and may include unintentional dictation errors.    Arta Silence, MD 12/23/17 [phone]

## 2017-12-23 NOTE — Discharge Instructions (Addendum)
Make an appointment to follow-up with your gastroenterologist.  Take the famotidine as prescribed for the next few weeks.  Make sure to eat soft foods over the next 1 to 2 days and thoroughly cut up her food into very small pieces.  Return to the ER immediately for new, worsening, recurrent chest or abdominal discomfort, inability to swallow, vomiting, or any other new or worsening symptoms that concern you.

## 2018-01-16 ENCOUNTER — Other Ambulatory Visit: Payer: Self-pay | Admitting: Internal Medicine

## 2018-04-02 ENCOUNTER — Encounter: Payer: PPO | Admitting: Family Medicine

## 2018-04-02 ENCOUNTER — Ambulatory Visit: Payer: PPO

## 2018-04-03 ENCOUNTER — Telehealth: Payer: Self-pay

## 2018-04-03 NOTE — Telephone Encounter (Signed)
Called pt to r/s missed AWV from yesterday and there was NANM. Will try again at another time.  -MM

## 2018-04-09 NOTE — Telephone Encounter (Signed)
AWV scheduled for 04/19/18 @ 8:40 AM and CPE scheduled for 07/02/18 @ 9:20 AM. -MM

## 2018-04-19 ENCOUNTER — Ambulatory Visit (INDEPENDENT_AMBULATORY_CARE_PROVIDER_SITE_OTHER): Payer: PPO

## 2018-04-19 VITALS — BP 124/52 | HR 52 | Temp 98.7°F | Ht 71.0 in | Wt 211.6 lb

## 2018-04-19 DIAGNOSIS — Z0000 Encounter for general adult medical examination without abnormal findings: Secondary | ICD-10-CM | POA: Diagnosis not present

## 2018-04-19 DIAGNOSIS — Z23 Encounter for immunization: Secondary | ICD-10-CM | POA: Diagnosis not present

## 2018-04-19 NOTE — Progress Notes (Signed)
Subjective:   Kevin Melton is a 68 y.o. male who presents for an Initial Medicare Annual Wellness Visit.  Review of Systems  N/A  Cardiac Risk Factors include: advanced age (>33men, >58 women);dyslipidemia;hypertension;male gender    Objective:    Today's Vitals   04/19/18 0837  BP: (!) 124/52  Pulse: (!) 52  Temp: 98.7 F (37.1 C)  TempSrc: Oral  Weight: 211 lb 9.6 oz (96 kg)  Height: 5\' 11"  (1.803 m)  PainSc: 0-No pain   Body mass index is 29.51 kg/m.  Advanced Directives 04/19/2018 12/23/2017 02/20/2017 02/10/2017 01/05/2017 11/10/2016 02/23/2015  Does Patient Have a Medical Advance Directive? No No No No No No No  Would patient like information on creating a medical advance directive? No - Patient declined - No - Patient declined - No - Patient declined - Yes - Educational materials given    Current Medications (verified) Outpatient Encounter Medications as of 04/19/2018  Medication Sig  . allopurinol (ZYLOPRIM) 300 MG tablet Take 300 mg by mouth at bedtime.   Marland Kitchen amLODipine (NORVASC) 5 MG tablet Take 1 tablet (5 mg total) by mouth daily.  Marland Kitchen aspirin 81 MG chewable tablet Chew 81 mg by mouth at bedtime.   . benazepril (LOTENSIN) 20 MG tablet Take 1 tablet (20 mg total) by mouth at bedtime.  . nitroGLYCERIN (NITROSTAT) 0.4 MG SL tablet Place 0.4 mg under the tongue every 5 (five) minutes as needed for chest pain.   Marland Kitchen atorvastatin (LIPITOR) 40 MG tablet Take 1 tablet (40 mg total) daily by mouth. (Patient not taking: Reported on 04/19/2018)  . famotidine (PEPCID) 20 MG tablet Take 1 tablet (20 mg total) by mouth 2 (two) times daily for 15 days. (Patient not taking: Reported on 04/19/2018)   No facility-administered encounter medications on file as of 04/19/2018.     Allergies (verified) Patient has no known allergies.   History: Past Medical History:  Diagnosis Date  . Abdominal hernia    umbilical  . Chronic kidney disease (CKD), active medical management  without dialysis   . Chronic kidney disease, stage III (moderate) (HCC)   . Coronary artery disease   . GERD (gastroesophageal reflux disease)   . History of kidney stones    h/o  . Kidney stones   . MI (myocardial infarction) [facility]) 2013   1 stent placed-sees Dr Estrella Myrtle) last seen in nov 2018   Past Surgical History:  Procedure Laterality Date  . COLONOSCOPY  2013  . CORONARY ANGIOPLASTY WITH STENT PLACEMENT  2013   [place]  . ESOPHAGOGASTRODUODENOSCOPY (EGD) WITH PROPOFOL N/A 01/05/2017   Procedure: ESOPHAGOGASTRODUODENOSCOPY (EGD) WITH PROPOFOL With Dilation;  Surgeon: Lin Landsman, MD;  Location: [facility] ENDOSCOPY;  Service: Gastroenterology;  Laterality: N/A;  . LITHOTRIPSY  2014  . UMBILICAL HERNIA REPAIR N/A 02/20/2017   Primary repair 2 cm umbilical hernia;  Surgeon: Robert Bellow, MD;  Location: [facility] ORS;  Service: General;  Laterality: N/A;   Family History  Problem Relation Age of Onset  . Dementia Mother 56  . Lung disease Father 30       collapsed lung  . Healthy Sister   . Healthy Brother   . Heart Problems Brother   . Healthy Son   . Healthy Sister   . Colon cancer Neg Hx   . Prostate cancer Neg Hx   . Heart disease Neg Hx    Social History   Socioeconomic History  . Marital status: Married    Spouse name:  [name]  . Number of children: 1  . Years of education: 1  . Highest education level: High school graduate  Occupational History  . Occupation: Self Employed    Comment: [employer]  . Financial resource strain: Not hard at all  . Food insecurity:    Worry: Never true    Inability: Never true  . Transportation needs:    Medical: No    Non-medical: No  Tobacco Use  . Smoking status: Never Smoker  . Smokeless tobacco: Never Used  Substance and Sexual Activity  . Alcohol use: No  . Drug use: No  . Sexual activity: Yes    Partners: Female  Lifestyle  . Physical activity:    Days per  week: 0 days    Minutes per session: 0 min  . Stress: Not at all  Relationships  . Social connections:    Talks on phone: Patient refused    Gets together: Patient refused    Attends religious service: Patient refused    Active member of club or organization: Patient refused    Attends meetings of clubs or organizations: Patient refused    Relationship status: Patient refused  Other Topics Concern  . Not on file  Social History Narrative  . Not on file   Tobacco Counseling Counseling given: Not Answered   Clinical Intake:  Pre-visit preparation completed: Yes  Pain : No/denies pain Pain Score: 0-No pain     Nutritional Status: BMI 25 -29 Overweight Nutritional Risks: None Diabetes: No  How often do you need to have someone help you when you read instructions, pamphlets, or other written materials from your doctor or pharmacy?: 1 - Never  Interpreter Needed?: No  Information entered by :: [facility], LPN  Activities of Daily Living In your present state of health, do you have any difficulty performing the following activities: 04/19/2018  Hearing? Y  Comment Has trouble hearing out of right ear. Does not wear hearing aids.   Vision? N  Comment Wears eye glasses.   Difficulty concentrating or making decisions? N  Walking or climbing stairs? N  Dressing or bathing? N  Doing errands, shopping? N  Preparing Food and eating ? N  Using the Toilet? N  In the past six months, have you accidently leaked urine? N  Do you have problems with loss of bowel control? N  Managing your Medications? N  Managing your Finances? N  Housekeeping or managing your Housekeeping? N  Some recent data might be hidden     Immunizations and Health Maintenance Immunization History  Administered Date(s) Administered  . Influenza, High Dose Seasonal PF 11/10/2016, 12/19/2017  . Pneumococcal Conjugate-13 11/10/2016  . Pneumococcal Polysaccharide-23 04/19/2018   Health Maintenance Due    Topic Date Due  . Samul Dada  10/22/1969    Patient Care Team: Virginia Crews, MD as PCP - General (Family Medicine) Murlean Iba, MD (Nephrology) End, Harrell Gave, MD as Consulting Physician (Cardiology)  Indicate any recent Medical Services you may have received from other than [facility] providers in the past year (date may be approximate).    Assessment:   This is a routine wellness examination for [place].  Hearing/Vision screen No exam data present  Dietary issues and exercise activities discussed: Current Exercise Habits: The patient does not participate in regular exercise at present, Exercise limited by: None identified  Goals    . Exercise 3x per week (30 min per time)     Recommend to  start fast walking three times a week for at least 30 minutes at a time.       Depression Screen PHQ 2/9 Scores 04/19/2018 04/19/2018 03/21/2017 11/10/2016  PHQ - 2 Score 0 0 0 0  PHQ- 9 Score 0 - - -    Fall Risk Fall Risk  04/19/2018 03/21/2017 11/10/2016  Falls in the past year? 0 No No    FALL RISK PREVENTION PERTAINING TO THE HOME:  Any stairs in or around the home WITH handrails? No  Home free of loose throw rugs in walkways, pet beds, electrical cords, etc? Yes  Adequate lighting in your home to reduce risk of falls? Yes   ASSISTIVE DEVICES UTILIZED TO PREVENT FALLS:  Life alert? No  Use of a cane, walker or w/c? No  Grab bars in the bathroom? No  Shower chair or bench in shower? No  Elevated toilet seat or a handicapped toilet? No    TIMED UP AND GO:  Was the test performed? No .    Cognitive Function:     6CIT Screen 04/19/2018  What Year? 0 points  What month? 0 points  What time? 0 points  Count back from 20 0 points  Months in reverse 0 points  Repeat phrase 0 points  Total Score 0    Screening Tests Health Maintenance  Topic Date Due  . TETANUS/TDAP  10/22/1969  . Fecal DNA (Cologuard)  04/06/2020  . INFLUENZA VACCINE  Completed  . Hepatitis C  Screening  Completed  . PNA vac Low Risk Adult  Completed    Qualifies for Shingles Vaccine? Yes . Due for Shingrix. Education has been provided regarding the importance of this vaccine. Pt has been advised to call insurance company to determine out of pocket expense. Advised may also receive vaccine at local pharmacy or Health Dept. Verbalized acceptance and understanding.  Tdap: Although this vaccine is not a covered service during a Wellness Exam, does the patient still wish to receive this vaccine today?  No .  Education has been provided regarding the importance of this vaccine. Advised may receive this vaccine at local pharmacy or Health Dept. Aware to provide a copy of the vaccination record if obtained from local pharmacy or Health Dept. Verbalized acceptance and understanding.  Flu Vaccine: Up to date  Pneumococcal Vaccine: Up to date   Cancer Screenings:  Colorectal Screening: Cologuard completed 04/06/17. Repeat every 3 years.  Lung Cancer Screening: (Low Dose CT Chest recommended if Age 34-80 years, 30 pack-year currently smoking OR have quit w/in 15years.) does not qualify.    Additional Screening:  Hepatitis C Screening: Up to date  Vision Screening: Recommended annual ophthalmology exams for early detection of glaucoma and other disorders of the eye.  Dental Screening: Recommended annual dental exams for proper oral hygiene  Community Resource Referral:  CRR required this visit?  No       Plan:  I have personally reviewed and addressed the Medicare Annual Wellness questionnaire and have noted the following in the patient's chart:  A. Medical and social history B. Use of alcohol, tobacco or illicit drugs  C. Current medications and supplements D. Functional ability and status E.  Nutritional status F.  Physical activity G. Advance directives H. List of other physicians I.  Hospitalizations, surgeries, and ER visits in previous 12 months J.   [place] such as hearing and vision if needed, cognitive and depression L. Referrals and appointments - none  In addition, I have  reviewed and discussed with patient certain preventive protocols, quality metrics, and best practice recommendations. A written personalized care plan for preventive services as well as general preventive health recommendations were provided to patient.  See attached scanned questionnaire for additional information.   Signed,  Fabio Neighbors, LPN Nurse Health Advisor   Nurse Recommendations: Pt declined the tetanus vaccine today.

## 2018-04-19 NOTE — Patient Instructions (Signed)
Kevin Melton , Thank you for taking time to come for your Medicare Wellness Visit. I appreciate your ongoing commitment to your health goals. Please review the following plan we discussed and let me know if I can assist you in the future.   Screening recommendations/referrals: Colonoscopy: Cologuard up to date, due 04/06/20  Recommended yearly ophthalmology/optometry visit for glaucoma screening and checkup Recommended yearly dental visit for hygiene and checkup  Vaccinations: Influenza vaccine: Up to date Pneumococcal vaccine: Completed series today. Tdap vaccine: Pt declines today.  Shingles vaccine: Pt declines today.     Advanced directives: Advance directive discussed with you today. Even though you declined this today please call our office should you change your mind and we can give you the proper paperwork for you to fill out.  Conditions/risks identified: Recommend to start fast walking three times a week for at least 30 minutes at a time.   Next appointment: 07/02/18 @ 9:20 AM with Dr Brita Romp  Preventive Care 67 Years and Older, Male Preventive care refers to lifestyle choices and visits with your health care provider that can promote health and wellness. What does preventive care include?  A yearly physical exam. This is also called an annual well check.  Dental exams once or twice a year.  Routine eye exams. Ask your health care provider how often you should have your eyes checked.  Personal lifestyle choices, including:  Daily care of your teeth and gums.  Regular physical activity.  Eating a healthy diet.  Avoiding tobacco and drug use.  Limiting alcohol use.  Practicing safe sex.  Taking low doses of aspirin every day.  Taking vitamin and mineral supplements as recommended by your health care provider. What happens during an annual well check? The services and screenings done by your health care provider during your annual well check will depend on  your age, overall health, lifestyle risk factors, and family history of disease. Counseling  Your health care provider may ask you questions about your:  Alcohol use.  Tobacco use.  Drug use.  Emotional well-being.  Home and relationship well-being.  Sexual activity.  Eating habits.  History of falls.  Memory and ability to understand (cognition).  Work and work Statistician. Screening  You may have the following tests or measurements:  Height, weight, and BMI.  Blood pressure.  Lipid and cholesterol levels. These may be checked every 5 years, or more frequently if you are over 70 years old.  Skin check.  Lung cancer screening. You may have this screening every year starting at age 13 if you have a 30-pack-year history of smoking and currently smoke or have quit within the past 15 years.  Fecal occult blood test (FOBT) of the stool. You may have this test every year starting at age 47.  Flexible sigmoidoscopy or colonoscopy. You may have a sigmoidoscopy every 5 years or a colonoscopy every 10 years starting at age 77.  Prostate cancer screening. Recommendations will vary depending on your family history and other risks.  Hepatitis C blood test.  Hepatitis B blood test.  Sexually transmitted disease (STD) testing.  Diabetes screening. This is done by checking your blood sugar (glucose) after you have not eaten for a while (fasting). You may have this done every 1-3 years.  Abdominal aortic aneurysm (AAA) screening. You may need this if you are a current or former smoker.  Osteoporosis. You may be screened starting at age 77 if you are at high risk. Talk with your health  care provider about your test results, treatment options, and if necessary, the need for more tests. Vaccines  Your health care provider may recommend certain vaccines, such as:  Influenza vaccine. This is recommended every year.  Tetanus, diphtheria, and acellular pertussis (Tdap, Td) vaccine.  You may need a Td booster every 10 years.  Zoster vaccine. You may need this after age 36.  Pneumococcal 13-valent conjugate (PCV13) vaccine. One dose is recommended after age 61.  Pneumococcal polysaccharide (PPSV23) vaccine. One dose is recommended after age 1. Talk to your health care provider about which screenings and vaccines you need and how often you need them. This information is not intended to replace advice given to you by your health care provider. Make sure you discuss any questions you have with your health care provider. Document Released: 03/20/2015 Document Revised: 11/11/2015 Document Reviewed: 12/23/2014 Elsevier Interactive Patient Education  2017 [place] Prevention in the Home Falls can cause injuries. They can happen to people of all ages. There are many things you can do to make your home safe and to help prevent falls. What can I do on the outside of my home?  Regularly fix the edges of walkways and driveways and fix any cracks.  Remove anything that might make you trip as you walk through a door, such as a raised step or threshold.  Trim any bushes or trees on the path to your home.  Use bright outdoor lighting.  Clear any walking paths of anything that might make someone trip, such as rocks or tools.  Regularly check to see if handrails are loose or broken. Make sure that both sides of any steps have handrails.  Any raised decks and porches should have guardrails on the edges.  Have any leaves, snow, or ice cleared regularly.  Use sand or salt on walking paths during winter.  Clean up any spills in your garage right away. This includes oil or grease spills. What can I do in the bathroom?  Use night lights.  Install grab bars by the toilet and in the tub and shower. Do not use towel bars as grab bars.  Use non-skid mats or decals in the tub or shower.  If you need to sit down in the shower, use a plastic, non-slip stool.  Keep the  floor dry. Clean up any water that spills on the floor as soon as it happens.  Remove soap buildup in the tub or shower regularly.  Attach bath mats securely with double-sided non-slip rug tape.  Do not have throw rugs and other things on the floor that can make you trip. What can I do in the bedroom?  Use night lights.  Make sure that you have a light by your bed that is easy to reach.  Do not use any sheets or blankets that are too big for your bed. They should not hang down onto the floor.  Have a firm chair that has side arms. You can use this for support while you get dressed.  Do not have throw rugs and other things on the floor that can make you trip. What can I do in the kitchen?  Clean up any spills right away.  Avoid walking on wet floors.  Keep items that you use a lot in easy-to-reach places.  If you need to reach something above you, use a strong step stool that has a grab bar.  Keep electrical cords out of the way.  Do not use floor  polish or wax that makes floors slippery. If you must use wax, use non-skid floor wax.  Do not have throw rugs and other things on the floor that can make you trip. What can I do with my stairs?  Do not leave any items on the stairs.  Make sure that there are handrails on both sides of the stairs and use them. Fix handrails that are broken or loose. Make sure that handrails are as long as the stairways.  Check any carpeting to make sure that it is firmly attached to the stairs. Fix any carpet that is loose or worn.  Avoid having throw rugs at the top or bottom of the stairs. If you do have throw rugs, attach them to the floor with carpet tape.  Make sure that you have a light switch at the top of the stairs and the bottom of the stairs. If you do not have them, ask someone to add them for you. What else can I do to help prevent falls?  Wear shoes that:  Do not have high heels.  Have rubber bottoms.  Are comfortable and fit  you well.  Are closed at the toe. Do not wear sandals.  If you use a stepladder:  Make sure that it is fully opened. Do not climb a closed stepladder.  Make sure that both sides of the stepladder are locked into place.  Ask someone to hold it for you, if possible.  Clearly mark and make sure that you can see:  Any grab bars or handrails.  First and last steps.  Where the edge of each step is.  Use tools that help you move around (mobility aids) if they are needed. These include:  Canes.  Walkers.  Scooters.  Crutches.  Turn on the lights when you go into a dark area. Replace any light bulbs as soon as they burn out.  Set up your furniture so you have a clear path. Avoid moving your furniture around.  If any of your floors are uneven, fix them.  If there are any pets around you, be aware of where they are.  Review your medicines with your doctor. Some medicines can make you feel dizzy. This can increase your chance of falling. Ask your doctor what other things that you can do to help prevent falls. This information is not intended to replace advice given to you by your health care provider. Make sure you discuss any questions you have with your health care provider. Document Released: 12/18/2008 Document Revised: 07/30/2015 Document Reviewed: 03/28/2014 Elsevier Interactive Patient Education  2017 [employer].

## 2018-05-02 ENCOUNTER — Other Ambulatory Visit: Payer: Self-pay | Admitting: Family Medicine

## 2018-05-02 MED ORDER — ATORVASTATIN CALCIUM 40 MG PO TABS: 40.0000 mg | ORAL_TABLET | Freq: Every day | ORAL | 3 refills | Status: DC

## 2018-05-02 MED ORDER — ALLOPURINOL 300 MG PO TABS: 300.0000 mg | ORAL_TABLET | Freq: Every day | ORAL | 3 refills | Status: DC

## 2018-05-02 NOTE — Telephone Encounter (Signed)
Pt needs refills on  Allopurinol 300 mg x 90 days   Atorvastatin 40 mg x 90 days  [facility]  Thanks  [employer]

## 2018-06-05 ENCOUNTER — Telehealth: Payer: Self-pay | Admitting: *Deleted

## 2018-06-05 NOTE — Telephone Encounter (Signed)
LMOVM to contact office to schedule future 12 month virtual f/u with Dr. Saunders Revel.

## 2018-07-02 ENCOUNTER — Encounter: Payer: PPO | Admitting: Family Medicine

## 2018-07-22 ENCOUNTER — Encounter: Payer: Self-pay | Admitting: Family Medicine

## 2018-07-23 ENCOUNTER — Telehealth: Payer: Self-pay | Admitting: *Deleted

## 2018-07-23 NOTE — Telephone Encounter (Signed)
Scheduled patient virtual visit with Dr End from recall list. Telehealth consent sent to MyChart and pending.

## 2018-07-25 ENCOUNTER — Encounter: Payer: Self-pay | Admitting: Family Medicine

## 2018-07-25 ENCOUNTER — Ambulatory Visit (INDEPENDENT_AMBULATORY_CARE_PROVIDER_SITE_OTHER): Payer: PPO | Admitting: Family Medicine

## 2018-07-25 VITALS — Wt 205.0 lb

## 2018-07-25 DIAGNOSIS — B372 Candidiasis of skin and nail: Secondary | ICD-10-CM | POA: Diagnosis not present

## 2018-07-25 MED ORDER — NYSTATIN 100000 UNIT/GM EX CREA: 1.0000 "application " | TOPICAL_CREAM | Freq: Two times a day (BID) | CUTANEOUS | 1 refills | Status: DC

## 2018-07-25 NOTE — Progress Notes (Signed)
Patient: Kevin Melton Male    DOB: [birth year]   68 y.o.   MRN: [id number] Visit Date: 07/25/2018  Today's Provider: Lavon Paganini, MD   Chief Complaint  Patient presents with  . Rash   Subjective:    I,Joseline E. Rosas,RMA am acting as a scribe for Virginia Crews, MD  Virtual Visit via Video Note  I connected with Alexsis Branscom Vollmer on [birth year] at  1:09 AM EDT by a video enabled telemedicine application and verified that I am speaking with the correct person using two identifiers.   Patient location: home Provider location: home office Persons involved in the visit: patient, provider   I discussed the limitations of evaluation and management by telemedicine and the availability of in person appointments. The patient expressed understanding and agreed to proceed.   Rash  This is a new problem. The current episode started 1 to 4 weeks ago (2-3). The problem has been gradually improving since onset. The affected locations include the groin. The rash is characterized by redness and itchiness. He was exposed to nothing. Past treatments include antibiotic cream (diaper rash, gold bond cream, Neosporin).    See mychart pictures  No Known Allergies   Current Outpatient Medications:  .  allopurinol (ZYLOPRIM) 300 MG tablet, Take 1 tablet (300 mg total) by mouth at bedtime., Disp: 90 tablet, Rfl: 3 .  amLODipine (NORVASC) 5 MG tablet, Take 1 tablet (5 mg total) by mouth daily., Disp: 90 tablet, Rfl: 3 .  aspirin 81 MG chewable tablet, Chew 81 mg by mouth at bedtime. , Disp: , Rfl:  .  atorvastatin (LIPITOR) 40 MG tablet, Take 1 tablet (40 mg total) by mouth daily., Disp: 90 tablet, Rfl: 3 .  benazepril (LOTENSIN) 20 MG tablet, Take 1 tablet (20 mg total) by mouth at bedtime., Disp: 90 tablet, Rfl: 3 .  nitroGLYCERIN (NITROSTAT) 0.4 MG SL tablet, Place 0.4 mg under the tongue every 5 (five) minutes as needed for chest pain. , Disp: , Rfl:  .  famotidine (PEPCID)  20 MG tablet, Take 1 tablet (20 mg total) by mouth 2 (two) times daily for 15 days. (Patient not taking: Reported on 04/19/2018), Disp: 30 tablet, Rfl: 0 .  nystatin cream (MYCOSTATIN), Apply 1 application topically 2 (two) times daily., Disp: 60 g, Rfl: 1  Review of Systems  Skin: Positive for rash.    Social History   Tobacco Use  . Smoking status: Never Smoker  . Smokeless tobacco: Never Used  Substance Use Topics  . Alcohol use: No      Objective:   Wt 205 lb (93 kg)   BMI 28.59 kg/m  Vitals:   07/25/18 0841  Weight: 205 lb (93 kg)     Physical Exam Constitutional:      Appearance: Normal appearance.  Pulmonary:     Effort: Pulmonary effort is normal. No respiratory distress.  Skin:    Comments: See mychart pictures of rash  Neurological:     Mental Status: He is alert and oriented to person, place, and time. Mental status is at baseline.  Psychiatric:        Mood and Affect: Mood normal.        Behavior: Behavior normal.         Assessment & Plan   Follow Up Instructions: I discussed the assessment and treatment plan with the patient. The patient was provided an opportunity to ask questions and all were answered. The patient agreed with  the plan and demonstrated an understanding of the instructions.   The patient was advised to call back or seek an in-person evaluation if the symptoms worsen or if the condition fails to improve as anticipated.  1. Candidal intertrigo - new problem - rash is consistent with intertrigo - discussed importance of keeping area clean and dry - use Nystatin cream BID until resolution - discussed return precautions    Meds ordered this encounter  Medications  . nystatin cream (MYCOSTATIN)    Sig: Apply 1 application topically 2 (two) times daily.    Dispense:  60 g    Refill:  1     Return if symptoms worsen or fail to improve.   The entirety of the information documented in the History of Present Illness, Review of  Systems and Physical Exam were personally obtained by me. Portions of this information were initially documented by Lyndel Pleasure, CMA and reviewed by me for thoroughness and accuracy.    Bacigalupo, Dionne Bucy, MD MPH [facility]

## 2018-07-25 NOTE — Progress Notes (Signed)
Virtual Visit via Telephone Note   This visit type was conducted due to national recommendations for restrictions regarding the COVID-19 Pandemic (e.g. social distancing) in an effort to limit this patient's exposure and mitigate transmission in our community.  Due to his co-morbid illnesses, this patient is at least at moderate risk for complications without adequate follow up.  This format is felt to be most appropriate for this patient at this time.  The patient did not have access to video technology/had technical difficulties with video requiring transitioning to audio format only (telephone).  All issues noted in this document were discussed and addressed.  No physical exam could be performed with this format.  Please refer to the patient's chart for his  consent to telehealth for [facility].   Date:  07/26/2018   ID:  Kevin Melton, DOB [birth year], MRN [id number]  Patient Location: Home Provider Location: Office  PCP:  Virginia Crews, MD  Cardiologist:  Nelva Bush, MD Electrophysiologist:  None   Evaluation Performed:  Follow-Up Visit  Chief Complaint: Follow-up coronary artery disease  History of Present Illness:    Kevin Melton is a 68 y.o. male with history of coronary artery disease status post BMS to RCA in 2013 in setting of inferior MI, hypertension, CKD stage III, and gout.  I met him in 01/2017, at which time he was doing well other than very rare episodes of brief palpitations.  Due to suboptimal LDL control, we agreed to switch pravastatin to atorvastatin 40 mg daily.  No other medication changes or further testing were pursued.  Today, Mr. Incorvaia reports that he has been doing well.  He has been more sedentary over the last few years but is hoping to begin a walking regimen again.  He had been walking up to 4 miles a day shortly after his MI in 2013.  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  He  denies chest pain, shortness of breath, lightheadedness, and edema.  He continues to have fluttering in his chest 3-4 times a year that lasts a few seconds and resolves spontaneously.  There are no associated symptoms.  He is tolerating his current medications well, though he notes that his blood pressure regimen was escalated last year by his nephrologist and PCP.  Home blood pressures have typically been in the 120's/70's since then  The patient does not have symptoms concerning for COVID-19 infection (fever, chills, cough, or new shortness of breath).    Past Medical History:  Diagnosis Date  . Abdominal hernia    umbilical  . Chronic kidney disease (CKD), active medical management without dialysis   . Chronic kidney disease, stage III (moderate) (HCC)   . Coronary artery disease   . GERD (gastroesophageal reflux disease)   . History of kidney stones    h/o  . Kidney stones   . MI (myocardial infarction) [facility]) 2013   1 stent placed-sees Dr Estrella Myrtle) last seen in nov 2018   Past Surgical History:  Procedure Laterality Date  . COLONOSCOPY  2013  . CORONARY ANGIOPLASTY WITH STENT PLACEMENT  2013   [place]  . ESOPHAGOGASTRODUODENOSCOPY (EGD) WITH PROPOFOL N/A 01/05/2017   Procedure: ESOPHAGOGASTRODUODENOSCOPY (EGD) WITH PROPOFOL With Dilation;  Surgeon: Lin Landsman, MD;  Location: [facility] ENDOSCOPY;  Service: Gastroenterology;  Laterality: N/A;  . LITHOTRIPSY  2014  . UMBILICAL HERNIA REPAIR N/A 02/20/2017   Primary repair 2 cm umbilical hernia;  Surgeon: Hervey Ard  W, MD;  Location: [facility] ORS;  Service: General;  Laterality: N/A;     Current Meds  Medication Sig  . allopurinol (ZYLOPRIM) 300 MG tablet Take 1 tablet (300 mg total) by mouth at bedtime.  Marland Kitchen amLODipine (NORVASC) 5 MG tablet Take 1 tablet (5 mg total) by mouth daily.  Marland Kitchen aspirin 81 MG chewable tablet Chew 81 mg by mouth at bedtime.   Marland Kitchen atorvastatin (LIPITOR) 40 MG tablet Take 1 tablet (40 mg total)  by mouth daily.  . benazepril (LOTENSIN) 20 MG tablet Take 1 tablet (20 mg total) by mouth at bedtime.  . famotidine (PEPCID) 20 MG tablet Take 1 tablet (20 mg total) by mouth 2 (two) times daily for 15 days.  . nitroGLYCERIN (NITROSTAT) 0.4 MG SL tablet Place 0.4 mg under the tongue every 5 (five) minutes as needed for chest pain.   Marland Kitchen nystatin cream (MYCOSTATIN) Apply 1 application topically 2 (two) times daily.     Allergies:   Patient has no known allergies.   Social History   Tobacco Use  . Smoking status: Never Smoker  . Smokeless tobacco: Never Used  Substance Use Topics  . Alcohol use: No  . Drug use: No     Family Hx: The patient's family history includes Dementia (age of onset: 57) in his mother; Healthy in his brother, sister, sister, and son; Heart Problems in his brother; Lung disease (age of onset: 46) in his father. There is no history of Colon cancer, Prostate cancer, or Heart disease.  ROS:   Please see the history of present illness.   All other systems reviewed and are negative.   Prior CV studies:   The following studies were reviewed today:  Cath/PCI:  LHC/PCI (2013, [place]): Details unknown.  Per report, bare-metal stent placed to RCA.  Non-Invasive Evaluation(s):  Pharmacologic MPI (11/05/13): Small reversible apical anteroseptal defect suggestive of ischemia.  Normal wall motion with LVEF of 60%.  Labs/Other Tests and Data Reviewed:    EKG:  No ECG reviewed.  Recent Labs: 09/18/2017: BUN 20; Creatinine, Ser 1.21; Potassium 4.6; Sodium 139   Recent Lipid Panel Lab Results  Component Value Date/Time   CHOL 108 01/31/2017 10:49 AM   TRIG 130 01/31/2017 10:49 AM   HDL 30 (L) 01/31/2017 10:49 AM   CHOLHDL 3.6 01/31/2017 10:49 AM   LDLCALC 52 01/31/2017 10:49 AM   LDLCALC 90 11/10/2016 04:30 PM    Wt Readings from Last 3 Encounters:  07/26/18 200 lb (90.7 kg)  07/25/18 205 lb (93 kg)  04/19/18 211 lb 9.6 oz (96 kg)     Objective:     Vital Signs:  Ht '5\' 9"'  (1.753 m)   Wt 200 lb (90.7 kg)   BMI 29.53 kg/m    VITAL SIGNS:  reviewed  ASSESSMENT & PLAN:    Coronary artery disease: No symptoms to suggest worsening coronary insufficiency noted today by the patient.  We will continue his current medications for secondary prevention.  I encouraged him to begin walking regularly again, aiming for 150 minutes/week of moderate intensity exercise.  We will plan to check a CBC shortly before our follow-up appointment, given long-term antiplatelet therapy.  Hypertension: Blood pressure typically well controlled at home.  The patient can continue his current regimen and follow-up with [place], his PCP, and nephrology, as previously arranged.  Hyperlipidemia: LDL was at goal on last check in 01/2017.  We will plan to repeat a fasting lipid panel and LFTs shortly before his follow-up  visit with [place] in 6 months, if not done in the meantime by his PCP.  He should continue atorvastatin 40 mg daily.  Chronic kidney disease stage III: Creatinine reasonable on last check in 09/2017 (1.2).  We will plan to continue current medications and defer ongoing follow-up of CKD to the patient's PCP and nephrologist.  COVID-19 Education: The signs and symptoms of COVID-19 were discussed with the patient and how to seek care for testing (follow up with PCP or arrange E-visit).  The importance of social distancing was discussed today.  Time:   Today, I have spent 10 minutes with the patient with telehealth technology discussing the above problems.  An additional 10 minutes were spent reviewing the patient's chart and documenting today's encounter.   Medication Adjustments/Labs and Tests Ordered: Current medicines are reviewed at length with the patient today.  Concerns regarding medicines are outlined above.   Tests Ordered: Orders Placed This Encounter  Procedures  . Lipid panel  . Comprehensive metabolic panel  . CBC with Differential/Platelet     Medication Changes: None.  Disposition:  Follow up in 6 month(s)  Signed, Nelva Bush, MD  07/26/2018 9:47 AM    [facility]

## 2018-07-25 NOTE — Patient Instructions (Signed)
Intertrigo  Intertrigo is skin irritation or inflammation (dermatitis) that occurs when folds of skin rub together. The irritation can cause a rash and make skin raw and itchy. This condition most commonly occurs in the skin folds of these areas:  · Toes.  · Armpits.  · Groin.  · Under the belly.  · Under the breasts.  · Buttocks.  Intertrigo is not passed from person to person (is not contagious).  What are the causes?  This condition is caused by heat, moisture, rubbing (friction), and not enough air circulation. The condition can be made worse by:  · Sweat.  · Bacteria.  · A fungus, such as yeast.  What increases the risk?  This condition is more likely to occur if you have moisture in your skin folds. You are more likely to develop this condition if you:  · Have diabetes.  · Are overweight.  · Are not able to move around or are not active.  · Live in a warm and moist climate.  · Wear splints, braces, or other medical devices.  · Are not able to control your bowels or bladder (have incontinence).  What are the signs or symptoms?  Symptoms of this condition include:  · A pink or red skin rash in the skin fold or near the skin fold.  · Raw or scaly skin.  · Itchiness.  · A burning feeling.  · Bleeding.  · Leaking fluid.  · A bad smell.  How is this diagnosed?  This condition is diagnosed with a medical history and physical exam. You may also have a skin swab to test for bacteria or a fungus.  How is this treated?  This condition may be treated by:  · Cleaning and drying your skin.  · Taking an antibiotic medicine or using an antibiotic skin cream for a bacterial infection.  · Using an antifungal cream on your skin or taking pills for an infection that was caused by a fungus, such as yeast.  · Using a steroid ointment to relieve itchiness and irritation.  · Separating the skin fold with a clean cotton cloth to absorb moisture and allow air to flow into the area.  Follow these instructions at home:  · Keep the  affected area clean and dry.  · Do not scratch your skin.  · Stay in a cool environment as much as possible. Use an air conditioner or fan, if available.  · Apply over-the-counter and prescription medicines only as told by your health care provider.  · If you were prescribed an antibiotic medicine, use it as told by your health care provider. Do not stop using the antibiotic even if your condition improves.  · Keep all follow-up visits as told by your health care provider. This is important.  How is this prevented?    · Maintain a healthy weight.  · Take care of your feet, especially if you have diabetes. Foot care includes:  ? Wearing shoes that fit well.  ? Keeping your feet dry.  ? Wearing clean, breathable socks.  · Protect the skin around your groin and buttocks, especially if you have incontinence. Skin protection includes:  ? Following a regular cleaning routine.  ? Using skin protectant creams, powders, or ointments.  ? Changing protection pads frequently.  · Do not wear tight clothes. Wear clothes that are loose, absorbent, and made of cotton.  · Wear a bra that gives good support, if needed.  · Shower and dry yourself well   after activity or exercise. Use a hair dryer on a cool setting to dry between skin folds, especially after you bathe.  · If you have diabetes, keep your blood sugar under control.  Contact a health care provider if:  · Your symptoms do not improve with treatment.  · Your symptoms get worse or they spread.  · You notice increased redness and warmth.  · You have a fever.  Summary  · Intertrigo is skin irritation or inflammation (dermatitis) that occurs when folds of skin rub together.  · This condition is caused by heat, moisture, rubbing (friction), and not enough air circulation.  · This condition may be treated by cleaning and drying your skin and with medicines.  · Apply over-the-counter and prescription medicines only as told by your health care provider.  · Keep all follow-up visits  as told by your health care provider. This is important.  This information is not intended to replace advice given to you by your health care provider. Make sure you discuss any questions you have with your health care provider.  Document Released: 02/21/2005 Document Revised: 07/24/2017 Document Reviewed: 07/24/2017  Elsevier Interactive Patient Education © 2019 Elsevier Inc.

## 2018-07-26 ENCOUNTER — Telehealth (INDEPENDENT_AMBULATORY_CARE_PROVIDER_SITE_OTHER): Payer: PPO | Admitting: Internal Medicine

## 2018-07-26 ENCOUNTER — Other Ambulatory Visit: Payer: Self-pay

## 2018-07-26 ENCOUNTER — Encounter: Payer: Self-pay | Admitting: Internal Medicine

## 2018-07-26 VITALS — Ht 69.0 in | Wt 200.0 lb

## 2018-07-26 DIAGNOSIS — I2510 Atherosclerotic heart disease of native coronary artery without angina pectoris: Secondary | ICD-10-CM

## 2018-07-26 DIAGNOSIS — I10 Essential (primary) hypertension: Secondary | ICD-10-CM

## 2018-07-26 DIAGNOSIS — E782 Mixed hyperlipidemia: Secondary | ICD-10-CM

## 2018-07-26 DIAGNOSIS — N183 Chronic kidney disease, stage 3 unspecified: Secondary | ICD-10-CM

## 2018-07-26 NOTE — Patient Instructions (Signed)
Medication Instructions:  Your physician recommends that you continue on your current medications as directed. Please refer to the Current Medication list given to you today.  If you need a refill on your cardiac medications before your next appointment, please call your pharmacy.   Lab work: Your physician recommends that you return for lab work in: [place] [facility] appointment. - At the [facility].   (LIPID, CMP, CBC) - [facility] You will check in at the front desk to the right as you walk into the atrium. Valet Parking is offered if needed. - You will need to be FASTING.  If you have labs (blood work) drawn today and your tests are completely normal, you will receive your results only by: Marland Kitchen MyChart Message (if you have MyChart) OR . A paper copy in the mail If you have any lab test that is abnormal or we need to change your treatment, we will call you to review the results.  Testing/Procedures: none  Follow-Up: At [facility], you and your health needs are our priority.  As part of our continuing mission to provide you with exceptional heart care, we have created designated Provider Care Teams.  These Care Teams include your primary Cardiologist (physician) and Advanced Practice Providers (APPs -  Physician Assistants and Nurse Practitioners) who all work together to provide you with the care you need, when you need it. You will need a follow up appointment in 6 months.   Make sure to have you lab work at the [facility] shortly before your appointment.  Please call our office 2 months in advance to schedule this appointment.  You may see Nelva Bush, MD or one of the following Advanced Practice Providers on your designated Care Team:   Murray Hodgkins, NP Christell Faith, PA-C . Marrianne Mood, PA-C

## 2018-10-02 ENCOUNTER — Other Ambulatory Visit: Payer: Self-pay | Admitting: Family Medicine

## 2018-10-26 ENCOUNTER — Ambulatory Visit (INDEPENDENT_AMBULATORY_CARE_PROVIDER_SITE_OTHER): Payer: PPO | Admitting: Family Medicine

## 2018-10-26 ENCOUNTER — Other Ambulatory Visit: Payer: Self-pay

## 2018-10-26 ENCOUNTER — Encounter: Payer: Self-pay | Admitting: Family Medicine

## 2018-10-26 VITALS — BP 125/73 | HR 53 | Temp 97.3°F | Resp 16 | Ht 69.0 in | Wt 212.0 lb

## 2018-10-26 DIAGNOSIS — Z23 Encounter for immunization: Secondary | ICD-10-CM

## 2018-10-26 DIAGNOSIS — N183 Chronic kidney disease, stage 3 unspecified: Secondary | ICD-10-CM

## 2018-10-26 DIAGNOSIS — E782 Mixed hyperlipidemia: Secondary | ICD-10-CM | POA: Diagnosis not present

## 2018-10-26 DIAGNOSIS — I2510 Atherosclerotic heart disease of native coronary artery without angina pectoris: Secondary | ICD-10-CM | POA: Diagnosis not present

## 2018-10-26 DIAGNOSIS — K2210 Ulcer of esophagus without bleeding: Secondary | ICD-10-CM

## 2018-10-26 DIAGNOSIS — Z0000 Encounter for general adult medical examination without abnormal findings: Secondary | ICD-10-CM

## 2018-10-26 DIAGNOSIS — M1A09X0 Idiopathic chronic gout, multiple sites, without tophus (tophi): Secondary | ICD-10-CM

## 2018-10-26 DIAGNOSIS — I252 Old myocardial infarction: Secondary | ICD-10-CM

## 2018-10-26 DIAGNOSIS — I10 Essential (primary) hypertension: Secondary | ICD-10-CM | POA: Diagnosis not present

## 2018-10-26 NOTE — Assessment & Plan Note (Signed)
Followed by cardiology Doing well and asymptomatic Continue aspirin, statin, antihypertensives Nitroglycerin as needed

## 2018-10-26 NOTE — Assessment & Plan Note (Signed)
Previously diagnosed by GI No longer on PPI Stable and doing well without symptoms Check CBC

## 2018-10-26 NOTE — Assessment & Plan Note (Addendum)
Followed by nephrology Recheck metabolic panel Avoid nephrotoxic medications

## 2018-10-26 NOTE — Assessment & Plan Note (Signed)
Continue atorvastatin Recheck lipid panel Given history of MI, goal LDL less than 70

## 2018-10-26 NOTE — Progress Notes (Signed)
Patient: Kevin Melton, Male    DOB: [id number], 68 y.o.   MRN: [id number] Visit Date: 10/26/2018  Today's Provider: Lavon Paganini, MD   Chief Complaint  Patient presents with  . Abdominal Pain   Subjective:     Annual physical exam Kevin Melton is a 68 y.o. male who presents today for health maintenance and complete physical. He feels well. He reports exercising 2-3 days. He reports he is sleeping well.  -----------------------------------------------------------------   Review of Systems  Constitutional: Negative.   HENT: Negative.   Eyes: Negative.   Respiratory: Negative.   Cardiovascular: Negative.   Gastrointestinal: Negative.   Endocrine: Negative.   Genitourinary: Negative.   Musculoskeletal: Negative.   Skin: Negative.   Allergic/Immunologic: Negative.   Neurological: Negative.   Hematological: Negative.   Psychiatric/Behavioral: Negative.     Social History      He  reports that he has never smoked. He has never used smokeless tobacco. He reports that he does not drink alcohol or use drugs.       Social History   Socioeconomic History  . Marital status: Married    Spouse name: [name]  . Number of children: 1  . Years of education: 67  . Highest education level: High school graduate  Occupational History  . Occupation: Self Employed    Comment: [employer]  . Financial resource strain: Not hard at all  . Food insecurity    Worry: Never true    Inability: Never true  . Transportation needs    Medical: No    Non-medical: No  Tobacco Use  . Smoking status: Never Smoker  . Smokeless tobacco: Never Used  Substance and Sexual Activity  . Alcohol use: No  . Drug use: No  . Sexual activity: Yes    Partners: Female  Lifestyle  . Physical activity    Days per week: 0 days    Minutes per session: 0 min  . Stress: Not at all  Relationships  . Social Herbalist on phone: Patient  refused    Gets together: Patient refused    Attends religious service: Patient refused    Active member of club or organization: Patient refused    Attends meetings of clubs or organizations: Patient refused    Relationship status: Patient refused  Other Topics Concern  . Not on file  Social History Narrative  . Not on file    Past Medical History:  Diagnosis Date  . Abdominal hernia    umbilical  . Chronic kidney disease (CKD), active medical management without dialysis   . Chronic kidney disease, stage III (moderate) (HCC)   . Coronary artery disease   . GERD (gastroesophageal reflux disease)   . History of kidney stones    h/o  . Kidney stones   . MI (myocardial infarction) [facility]) 2013   1 stent placed-sees Dr Estrella Myrtle) last seen in nov 2018     Patient Active Problem List   Diagnosis Date Noted  . Seborrheic keratoses 09/18/2017  . Chronic kidney disease, stage III (moderate) ([place]) 03/21/2017  . Coronary artery disease involving native coronary artery of native heart without angina pectoris 01/12/2017  . Erosive esophagitis   . H/O acute myocardial infarction 11/10/2016  . Dysphagia 11/10/2016  . Umbilical hernia 0000000  . Essential hypertension with goal blood pressure less than 130/80 09/04/2014  . Mixed hyperlipidemia 09/04/2014  . Gout 11/05/2013  Past Surgical History:  Procedure Laterality Date  . COLONOSCOPY  2013  . CORONARY ANGIOPLASTY WITH STENT PLACEMENT  2013   [place]  . ESOPHAGOGASTRODUODENOSCOPY (EGD) WITH PROPOFOL N/A 01/05/2017   Procedure: ESOPHAGOGASTRODUODENOSCOPY (EGD) WITH PROPOFOL With Dilation;  Surgeon: Lin Landsman, MD;  Location: [facility] ENDOSCOPY;  Service: Gastroenterology;  Laterality: N/A;  . LITHOTRIPSY  2014  . UMBILICAL HERNIA REPAIR N/A 02/20/2017   Primary repair 2 cm umbilical hernia;  Surgeon: Robert Bellow, MD;  Location: [facility] ORS;  Service: General;  Laterality: N/A;    Family History         Family Status  Relation Name Status  . Mother  Deceased  . Father  Deceased  . Sister  Alive  . Brother  Alive  . Son  Alive  . Sister  Alive  . Neg Hx  (Not Specified)        His family history includes Dementia (age of onset: 59) in his mother; Healthy in his brother, sister, sister, and son; Heart Problems in his brother; Lung disease (age of onset: 60) in his father. There is no history of Colon cancer, Prostate cancer, or Heart disease.      No Known Allergies   Current Outpatient Medications:  .  allopurinol (ZYLOPRIM) 300 MG tablet, Take 1 tablet (300 mg total) by mouth at bedtime., Disp: 90 tablet, Rfl: 3 .  amLODipine (NORVASC) 5 MG tablet, Take 1 tablet (5 mg total) by mouth daily., Disp: 90 tablet, Rfl: 3 .  aspirin 81 MG chewable tablet, Chew 81 mg by mouth at bedtime. , Disp: , Rfl:  .  benazepril (LOTENSIN) 20 MG tablet, TAKE 1 TABLET BY MOUTH AT BEDTIME, Disp: 90 tablet, Rfl: 1 .  nitroGLYCERIN (NITROSTAT) 0.4 MG SL tablet, Place 0.4 mg under the tongue every 5 (five) minutes as needed for chest pain. , Disp: , Rfl:  .  atorvastatin (LIPITOR) 40 MG tablet, Take 1 tablet (40 mg total) by mouth daily., Disp: 90 tablet, Rfl: 3   Patient Care Team: , Dionne Bucy, MD as PCP - General (Family Medicine) End, Harrell Gave, MD as PCP - Cardiology (Cardiology) Murlean Iba, MD (Nephrology) End, Harrell Gave, MD as Consulting Physician (Cardiology)    Objective:    Vitals: BP 125/73 (BP Location: Left Arm, Patient Position: Sitting, Cuff Size: Large)   Pulse (!) 53   Temp (!) 97.3 F (36.3 C) (Temporal)   Resp 16   Ht 5\' 9"  (1.753 m)   Wt 212 lb (96.2 kg)   BMI 31.31 kg/m    Vitals:   10/26/18 0909  BP: 125/73  Pulse: (!) 53  Resp: 16  Temp: (!) 97.3 F (36.3 C)  TempSrc: Temporal  Weight: 212 lb (96.2 kg)  Height: 5\' 9"  (1.753 m)     Physical Exam Vitals signs reviewed.  Constitutional:      General: He is not in acute distress.    Appearance:  Normal appearance. He is well-developed. He is not diaphoretic.  HENT:     Head: Normocephalic and atraumatic.     Right Ear: Tympanic membrane, ear canal and external ear normal.     Left Ear: Tympanic membrane, ear canal and external ear normal.  Eyes:     General: No scleral icterus.    Conjunctiva/sclera: Conjunctivae normal.     Pupils: Pupils are equal, round, and reactive to light.  Neck:     Musculoskeletal: Neck supple.     Thyroid: No thyromegaly.  Cardiovascular:  Rate and Rhythm: Normal rate and regular rhythm.     Pulses: Normal pulses.     Heart sounds: Normal heart sounds. No murmur.  Pulmonary:     Effort: Pulmonary effort is normal. No respiratory distress.     Breath sounds: Normal breath sounds. No wheezing or rales.  Abdominal:     General: There is no distension.     Palpations: Abdomen is soft.     Tenderness: There is no abdominal tenderness. There is no guarding or rebound.  Musculoskeletal:        General: No deformity.     Right lower leg: No edema.     Left lower leg: No edema.  Lymphadenopathy:     Cervical: No cervical adenopathy.  Skin:    General: Skin is warm and dry.     Capillary Refill: Capillary refill takes less than 2 seconds.     Findings: No rash.  Neurological:     Mental Status: He is alert and oriented to person, place, and time.  Psychiatric:        Mood and Affect: Mood normal.        Behavior: Behavior normal.        Thought Content: Thought content normal.      Depression Screen PHQ 2/9 Scores 10/26/2018 04/19/2018 04/19/2018 03/21/2017  PHQ - 2 Score 0 0 0 0  PHQ- 9 Score 0 0 - -       Assessment & Plan:     Routine Health Maintenance and Physical Exam  Exercise Activities and Dietary recommendations Goals    . Exercise 3x per week (30 min per time)     Recommend to start fast walking three times a week for at least 30 minutes at a time.        Immunization History  Administered Date(s) Administered  .  Influenza, High Dose Seasonal PF 11/10/2016, 12/19/2017  . Pneumococcal Conjugate-13 11/10/2016  . Pneumococcal Polysaccharide-23 04/19/2018    Health Maintenance  Topic Date Due  . TETANUS/TDAP  10/22/1970  . INFLUENZA VACCINE  10/06/2018  . Fecal DNA (Cologuard)  04/06/2020  . Hepatitis C Screening  Completed  . PNA vac Low Risk Adult  Completed     Discussed health benefits of physical activity, and encouraged him to engage in regular exercise appropriate for his age and condition.    --------------------------------------------------------------------  Problem List Items Addressed This Visit      Cardiovascular and Mediastinum   Essential hypertension with goal blood pressure less than 130/80    Well-controlled Continue benazepril and amlodipine at current doses Recheck metabolic panel Follow-up in 6 months      Relevant Orders   CBC with Differential/Platelet   Comprehensive metabolic panel   Coronary artery disease involving native coronary artery of native heart without angina pectoris    Followed by cardiology Doing well and asymptomatic Continue aspirin, statin, antihypertensives Nitroglycerin as needed      Relevant Orders   CBC with Differential/Platelet     Digestive   Erosive esophagitis    Previously diagnosed by GI No longer on PPI Stable and doing well without symptoms Check CBC      Relevant Orders   CBC with Differential/Platelet     Genitourinary   Chronic kidney disease, stage III (moderate) ([place])    Followed by nephrology Recheck metabolic panel Avoid nephrotoxic medications      Relevant Orders   Comprehensive metabolic panel     Other   H/O acute myocardial  infarction    Followed by cardiology Not on a beta-blocker due to bradycardia No medication changes today Recheck CMP and lipid panel      Relevant Orders   CBC with Differential/Platelet   Gout    Chronic and well-controlled No flares in last 4 years Continue  allopurinol Recheck uric acid level, goal less than 6      Relevant Orders   Uric acid   Mixed hyperlipidemia    Continue atorvastatin Recheck lipid panel Given history of MI, goal LDL less than 70      Relevant Orders   Comprehensive metabolic panel   Lipid Panel With LDL/HDL Ratio    Other Visit Diagnoses    Annual physical exam    -  Primary   Need for influenza vaccination       Relevant Orders   Flu Vaccine QUAD High Dose(Fluad)   Stage 3 chronic kidney disease ([place])       Relevant Orders   Comprehensive metabolic panel       Return in about 6 months (around 04/28/2019) for chronic disease f/u (ok to schedule back to back with [facility]).   The entirety of the information documented in the History of Present Illness, Review of Systems and Physical Exam were personally obtained by me. Portions of this information were initially documented by Lynford Humphrey, CMA and reviewed by me for thoroughness and accuracy.    , Dionne Bucy, MD MPH [facility]

## 2018-10-26 NOTE — Assessment & Plan Note (Signed)
Well-controlled Continue benazepril and amlodipine at current doses Recheck metabolic panel Follow-up in 6 months

## 2018-10-26 NOTE — Assessment & Plan Note (Signed)
Followed by cardiology Not on a beta-blocker due to bradycardia No medication changes today Recheck CMP and lipid panel

## 2018-10-26 NOTE — Patient Instructions (Signed)
Preventive Care 67 Years and Older, Male Preventive care refers to lifestyle choices and visits with your health care provider that can promote health and wellness. This includes:  A yearly physical exam. This is also called an annual well check.  Regular dental and eye exams.  Immunizations.  Screening for certain conditions.  Healthy lifestyle choices, such as diet and exercise. What can I expect for my preventive care visit? Physical exam Your health care provider will check:  Height and weight. These may be used to calculate body mass index (BMI), which is a measurement that tells if you are at a healthy weight.  Heart rate and blood pressure.  Your skin for abnormal spots. Counseling Your health care provider may ask you questions about:  Alcohol, tobacco, and drug use.  Emotional well-being.  Home and relationship well-being.  Sexual activity.  Eating habits.  History of falls.  Memory and ability to understand (cognition).  Work and work Statistician. What immunizations do I need?  Influenza (flu) vaccine  This is recommended every year. Tetanus, diphtheria, and pertussis (Tdap) vaccine  You may need a Td booster every 10 years. Varicella (chickenpox) vaccine  You may need this vaccine if you have not already been vaccinated. Zoster (shingles) vaccine  You may need this after age 50. Pneumococcal conjugate (PCV13) vaccine  One dose is recommended after age 24. Pneumococcal polysaccharide (PPSV23) vaccine  One dose is recommended after age 33. Measles, mumps, and rubella (MMR) vaccine  You may need at least one dose of MMR if you were born in 1957 or later. You may also need a second dose. Meningococcal conjugate (MenACWY) vaccine  You may need this if you have certain conditions. Hepatitis A vaccine  You may need this if you have certain conditions or if you travel or work in places where you may be exposed to hepatitis A. Hepatitis B vaccine   You may need this if you have certain conditions or if you travel or work in places where you may be exposed to hepatitis B. Haemophilus influenzae type b (Hib) vaccine  You may need this if you have certain conditions. You may receive vaccines as individual doses or as more than one vaccine together in one shot (combination vaccines). Talk with your health care provider about the risks and benefits of combination vaccines. What tests do I need? Blood tests  Lipid and cholesterol levels. These may be checked every 5 years, or more frequently depending on your overall health.  Hepatitis C test.  Hepatitis B test. Screening  Lung cancer screening. You may have this screening every year starting at age 74 if you have a 30-pack-year history of smoking and currently smoke or have quit within the past 15 years.  Colorectal cancer screening. All adults should have this screening starting at age 57 and continuing until age 54. Your health care provider may recommend screening at age 47 if you are at increased risk. You will have tests every 1-10 years, depending on your results and the type of screening test.  Prostate cancer screening. Recommendations will vary depending on your family history and other risks.  Diabetes screening. This is done by checking your blood sugar (glucose) after you have not eaten for a while (fasting). You may have this done every 1-3 years.  Abdominal aortic aneurysm (AAA) screening. You may need this if you are a current or former smoker.  Sexually transmitted disease (STD) testing. Follow these instructions at home: Eating and drinking  Eat  a diet that includes fresh fruits and vegetables, whole grains, lean protein, and low-fat dairy products. Limit your intake of foods with high amounts of sugar, saturated fats, and salt.  Take vitamin and mineral supplements as recommended by your health care provider.  Do not drink alcohol if your health care provider  tells you not to drink.  If you drink alcohol: ? Limit how much you have to 0-2 drinks a day. ? Be aware of how much alcohol is in your drink. In the [place] one drink equals one 12 oz bottle of beer (355 mL), one 5 oz glass of wine (148 mL), or one 1 oz glass of hard liquor (44 mL). Lifestyle  Take daily care of your teeth and gums.  Stay active. Exercise for at least 30 minutes on 5 or more days each week.  Do not use any products that contain nicotine or tobacco, such as cigarettes, e-cigarettes, and chewing tobacco. If you need help quitting, ask your health care provider.  If you are sexually active, practice safe sex. Use a condom or other form of protection to prevent STIs (sexually transmitted infections).  Talk with your health care provider about taking a low-dose aspirin or statin. What's next?  Visit your health care provider once a year for a well check visit.  Ask your health care provider how often you should have your eyes and teeth checked.  Stay up to date on all vaccines. This information is not intended to replace advice given to you by your health care provider. Make sure you discuss any questions you have with your health care provider. Document Released: 03/20/2015 Document Revised: 02/15/2018 Document Reviewed: 02/15/2018 Elsevier Patient Education  2020 Elsevier Inc.  

## 2018-10-26 NOTE — Assessment & Plan Note (Signed)
Chronic and well-controlled No flares in last 4 years Continue allopurinol Recheck uric acid level, goal less than 6

## 2018-10-27 LAB — CBC WITH DIFFERENTIAL/PLATELET
Basophils Absolute: 0.1 10*3/uL (ref 0.0–0.2)
Basos: 1 %
EOS (ABSOLUTE): 0.7 10*3/uL — ABNORMAL HIGH (ref 0.0–0.4)
Eos: 9 %
Hematocrit: 43.9 % (ref 37.5–51.0)
Hemoglobin: 14.9 g/dL (ref 13.0–17.7)
Immature Grans (Abs): 0 10*3/uL (ref 0.0–0.1)
Immature Granulocytes: 0 %
Lymphocytes Absolute: 2.3 10*3/uL (ref 0.7–3.1)
Lymphs: 28 %
MCH: 30.2 pg (ref 26.6–33.0)
MCHC: 33.9 g/dL (ref 31.5–35.7)
MCV: 89 fL (ref 79–97)
Monocytes Absolute: 0.7 10*3/uL (ref 0.1–0.9)
Monocytes: 8 %
Neutrophils Absolute: 4.4 10*3/uL (ref 1.4–7.0)
Neutrophils: 54 %
Platelets: 221 10*3/uL (ref 150–450)
RBC: 4.93 x10E6/uL (ref 4.14–5.80)
RDW: 12.7 % (ref 11.6–15.4)
WBC: 8.2 10*3/uL (ref 3.4–10.8)

## 2018-10-27 LAB — COMPREHENSIVE METABOLIC PANEL
ALT: 19 IU/L (ref 0–44)
AST: 14 IU/L (ref 0–40)
Albumin/Globulin Ratio: 1.5 (ref 1.2–2.2)
Albumin: 4.1 g/dL (ref 3.8–4.8)
Alkaline Phosphatase: 77 IU/L (ref 39–117)
BUN/Creatinine Ratio: 13 (ref 10–24)
BUN: 16 mg/dL (ref 8–27)
Bilirubin Total: 0.6 mg/dL (ref 0.0–1.2)
CO2: 25 mmol/L (ref 20–29)
Calcium: 9.1 mg/dL (ref 8.6–10.2)
Chloride: 104 mmol/L (ref 96–106)
Creatinine, Ser: 1.26 mg/dL (ref 0.76–1.27)
GFR calc Af Amer: 68 mL/min/{1.73_m2} (ref >59)
GFR calc non Af Amer: 59 mL/min/{1.73_m2} — ABNORMAL LOW (ref >59)
Globulin, Total: 2.7 g/dL (ref 1.5–4.5)
Glucose: 105 mg/dL — ABNORMAL HIGH (ref 65–99)
Potassium: 4.8 mmol/L (ref 3.5–5.2)
Sodium: 141 mmol/L (ref 134–144)
Total Protein: 6.8 g/dL (ref 6.0–8.5)

## 2018-10-27 LAB — LIPID PANEL WITH LDL/HDL RATIO
Cholesterol, Total: 116 mg/dL (ref 100–199)
HDL: 33 mg/dL — ABNORMAL LOW (ref >39)
LDL Calculated: 57 mg/dL (ref 0–99)
LDl/HDL Ratio: 1.7 ratio (ref 0.0–3.6)
Triglycerides: 130 mg/dL (ref 0–149)
VLDL Cholesterol Cal: 26 mg/dL (ref 5–40)

## 2018-10-27 LAB — URIC ACID: Uric Acid: 4.7 mg/dL (ref 3.7–8.6)

## 2018-10-29 ENCOUNTER — Telehealth: Payer: Self-pay

## 2018-10-29 NOTE — Telephone Encounter (Signed)
-----   Message from Virginia Crews, MD sent at 10/29/2018 11:12 AM EDT ----- Normal labs

## 2018-10-29 NOTE — Telephone Encounter (Signed)
Patient was advised.  

## 2018-11-13 ENCOUNTER — Encounter: Payer: PPO | Admitting: Family Medicine

## 2018-12-27 ENCOUNTER — Other Ambulatory Visit: Payer: Self-pay

## 2018-12-27 ENCOUNTER — Encounter: Payer: Self-pay | Admitting: Family Medicine

## 2018-12-27 ENCOUNTER — Ambulatory Visit: Payer: PPO | Admitting: Physician Assistant

## 2018-12-27 ENCOUNTER — Ambulatory Visit (INDEPENDENT_AMBULATORY_CARE_PROVIDER_SITE_OTHER): Payer: PPO | Admitting: Family Medicine

## 2018-12-27 VITALS — BP 137/71 | HR 55 | Temp 96.9°F | Wt 212.0 lb

## 2018-12-27 DIAGNOSIS — M7632 Iliotibial band syndrome, left leg: Secondary | ICD-10-CM

## 2018-12-27 DIAGNOSIS — H6121 Impacted cerumen, right ear: Secondary | ICD-10-CM | POA: Diagnosis not present

## 2018-12-27 DIAGNOSIS — M25562 Pain in left knee: Secondary | ICD-10-CM | POA: Diagnosis not present

## 2018-12-27 NOTE — Progress Notes (Signed)
Patient: Kevin Melton Male    DOB: [id number]   68 y.o.   MRN: [id number] Visit Date: 12/27/2018  Today's Provider: Lavon Paganini, MD   Chief Complaint  Patient presents with  . Ear Fullness  . Knee Pain   Subjective:     Ear Fullness  There is pain in the right ear. This is a new problem. The current episode started in the past 7 days. The problem occurs constantly. The problem has been gradually improving. There has been no fever. Associated symptoms include hearing loss. Pertinent negatives include no abdominal pain, coughing, diarrhea, ear discharge, headaches, neck pain, rash, rhinorrhea, sore throat or vomiting. He has tried ear drops for the symptoms. The treatment provided no relief.  Knee Pain  The incident occurred at home. The injury mechanism was a twisting injury. The pain is present in the left knee. The pain has been improving since onset. Pertinent negatives include no inability to bear weight, loss of motion, loss of sensation, muscle weakness, numbness or tingling. He has tried ice for the symptoms. The treatment provided mild relief.   Knee injury was 5 weeks ago Getting better but not perfect Twisting injury Pain is around the knee, doesn't feel like it is in the knee joint Swelled initially ice helped that No treatments currently Hurts when rolls on that side at night Little sore if walking too much   No Known Allergies   Current Outpatient Medications:  .  allopurinol (ZYLOPRIM) 300 MG tablet, Take 1 tablet (300 mg total) by mouth at bedtime., Disp: 90 tablet, Rfl: 3 .  amLODipine (NORVASC) 5 MG tablet, Take 1 tablet (5 mg total) by mouth daily., Disp: 90 tablet, Rfl: 3 .  aspirin 81 MG chewable tablet, Chew 81 mg by mouth at bedtime. , Disp: , Rfl:  .  benazepril (LOTENSIN) 20 MG tablet, TAKE 1 TABLET BY MOUTH AT BEDTIME, Disp: 90 tablet, Rfl: 1 .  nitroGLYCERIN (NITROSTAT) 0.4 MG SL tablet, Place 0.4 mg under the tongue every 5 (five)  minutes as needed for chest pain. , Disp: , Rfl:  .  atorvastatin (LIPITOR) 40 MG tablet, Take 1 tablet (40 mg total) by mouth daily., Disp: 90 tablet, Rfl: 3  Review of Systems  Constitutional: Negative.   HENT: Positive for ear pain and hearing loss. Negative for congestion, ear discharge, rhinorrhea, sinus pressure, sinus pain, sneezing, sore throat, tinnitus, trouble swallowing and voice change.   Respiratory: Negative.  Negative for cough.   Gastrointestinal: Negative.  Negative for abdominal pain, diarrhea and vomiting.  Musculoskeletal: Negative for neck pain.  Skin: Negative for rash.  Neurological: Negative for dizziness, tingling, light-headedness, numbness and headaches.    Social History   Tobacco Use  . Smoking status: Never Smoker  . Smokeless tobacco: Never Used  Substance Use Topics  . Alcohol use: No      Objective:   BP 137/71 (BP Location: Right Arm, Patient Position: Sitting, Cuff Size: Large)   Pulse (!) 55   Temp (!) 96.9 F (36.1 C) (Temporal)   Wt 212 lb (96.2 kg)   BMI 31.31 kg/m  Vitals:   12/27/18 1110  BP: 137/71  Pulse: (!) 55  Temp: (!) 96.9 F (36.1 C)  TempSrc: Temporal  Weight: 212 lb (96.2 kg)  Body mass index is 31.31 kg/m.   Physical Exam Vitals signs reviewed.  Constitutional:      General: He is not in acute distress.    Appearance: Normal appearance.  He is not diaphoretic.  HENT:     Head: Normocephalic and atraumatic.     Right Ear: There is impacted cerumen.     Ears:     Comments: Some cerumen in L ear but not impacted Eyes:     General: No scleral icterus.    Conjunctiva/sclera: Conjunctivae normal.  Cardiovascular:     Rate and Rhythm: Normal rate and regular rhythm.  Pulmonary:     Effort: Pulmonary effort is normal. No respiratory distress.  Musculoskeletal:     Right lower leg: No edema.     Left lower leg: No edema.     Comments: L Knee: Normal to inspection with no erythema or effusion or obvious bony  abnormalities.  Palpation normal with no warmth or joint line tenderness or patellar tenderness or condyle tenderness. ROM normal in flexion and extension and lower leg rotation. Ligaments with solid consistent endpoints including ACL, PCL, LCL, MCL. Negative Mcmurray's and provocative meniscal tests. Non painful patellar compression. Patellar and quadriceps tendons unremarkable. Hamstring and quadriceps strength is normal.  Tightness and mild TTP over IT band  Skin:    General: Skin is warm and dry.     Capillary Refill: Capillary refill takes less than 2 seconds.     Findings: No rash.  Neurological:     Mental Status: He is alert and oriented to person, place, and time. Mental status is at baseline.     Cranial Nerves: No cranial nerve deficit.     Sensory: No sensory deficit.     Motor: No weakness.     Gait: Gait normal.  Psychiatric:        Mood and Affect: Mood normal.        Behavior: Behavior normal.      No results found for any visits on 12/27/18.     Assessment & Plan   1. Pain in lateral portion of left knee 2. It band syndrome, left - new problem x5 wks - exam is benign - has tightness of IT band - discussed NSAIDs, ice, rest - HEP given - can consider formal PT in future if needed  3. Impacted cerumen of right ear - fullness is from cerumen impaction - irrigated in clinic   Return if symptoms worsen or fail to improve.   The entirety of the information documented in the History of Present Illness, Review of Systems and Physical Exam were personally obtained by me. Portions of this information were initially documented by Ashley Royalty, CMA and reviewed by me for thoroughness and accuracy.    Bacigalupo, Dionne Bucy, MD MPH [facility]

## 2018-12-27 NOTE — Patient Instructions (Signed)
Earwax Buildup, Adult The ears produce a substance called earwax that helps keep bacteria out of the ear and protects the skin in the ear canal. Occasionally, earwax can build up in the ear and cause discomfort or hearing loss. What increases the risk? This condition is more likely to develop in people who:  Are male.  Are elderly.  Naturally produce more earwax.  Clean their ears often with cotton swabs.  Use earplugs often.  Use in-ear headphones often.  Wear hearing aids.  Have narrow ear canals.  Have earwax that is overly thick or sticky.  Have eczema.  Are dehydrated.  Have excess hair in the ear canal. What are the signs or symptoms? Symptoms of this condition include:  Reduced or muffled hearing.  A feeling of fullness in the ear or feeling that the ear is plugged.  Fluid coming from the ear.  Ear pain.  Ear itch.  Ringing in the ear.  Coughing.  An obvious piece of earwax that can be seen inside the ear canal. How is this diagnosed? This condition may be diagnosed based on:  Your symptoms.  Your medical history.  An ear exam. During the exam, your health care provider will look into your ear with an instrument called an otoscope. You may have tests, including a hearing test. How is this treated? This condition may be treated by:  Using ear drops to soften the earwax.  Having the earwax removed by a health care provider. The health care provider may: ? Flush the ear with water. ? Use an instrument that has a loop on the end (curette). ? Use a suction device.  Surgery to remove the wax buildup. This may be done in severe cases. Follow these instructions at home:   Take over-the-counter and prescription medicines only as told by your health care provider.  Do not put any objects, including cotton swabs, into your ear. You can clean the opening of your ear canal with a washcloth or facial tissue.  Follow instructions from your health care  provider about cleaning your ears. Do not over-clean your ears.  Drink enough fluid to keep your urine clear or pale yellow. This will help to thin the earwax.  Keep all follow-up visits as told by your health care provider. If earwax builds up in your ears often or if you use hearing aids, consider seeing your health care provider for routine, preventive ear cleanings. Ask your health care provider how often you should schedule your cleanings.  If you have hearing aids, clean them according to instructions from the manufacturer and your health care provider. Contact a health care provider if:  You have ear pain.  You develop a fever.  You have blood, pus, or other fluid coming from your ear.  You have hearing loss.  You have ringing in your ears that does not go away.  Your symptoms do not improve with treatment.  You feel like the room is spinning (vertigo). Summary  Earwax can build up in the ear and cause discomfort or hearing loss.  The most common symptoms of this condition include reduced or muffled hearing and a feeling of fullness in the ear or feeling that the ear is plugged.  This condition may be diagnosed based on your symptoms, your medical history, and an ear exam.  This condition may be treated by using ear drops to soften the earwax or by having the earwax removed by a health care provider.  Do not put any   objects, including cotton swabs, into your ear. You can clean the opening of your ear canal with a washcloth or facial tissue. This information is not intended to replace advice given to you by your health care provider. Make sure you discuss any questions you have with your health care provider. Document Released: 03/31/2004 Document Revised: 02/03/2017 Document Reviewed: 05/04/2016 Elsevier Patient Education  2020 [employer]vier Inc[employer]  

## 2019-01-12 ENCOUNTER — Other Ambulatory Visit: Payer: Self-pay | Admitting: Family Medicine

## 2019-01-16 ENCOUNTER — Telehealth: Payer: Self-pay | Admitting: Internal Medicine

## 2019-01-16 NOTE — Telephone Encounter (Signed)
   Primary Cardiologist: Nelva Bush, MD  Chart reviewed as part of pre-operative protocol coverage. Simple dental extractions are considered low risk procedures per guidelines and generally do not require any specific cardiac clearance. It is also generally accepted that for simple extractions and dental cleanings, there is no need to interrupt blood thinner therapy.   SBE prophylaxis is not required for the patient.  I will route this recommendation to the requesting party via Epic fax function and remove from pre-op pool.  Please call with questions.  Abigail Butts, PA-C 01/16/2019, 10:45 AM

## 2019-01-16 NOTE — Telephone Encounter (Signed)
    [facility] Pre-operative Risk Assessment    Request for surgical clearance:  1. What type of surgery is being performed? EXTRACTION  2. When is this surgery scheduled? TODAY  3. What type of clearance is required (medical clearance vs. Pharmacy clearance to hold med vs. Both)? BOTH  4. Are there any medications that need to be held prior to surgery and how long? NOT SURE   5. Practice name and name of physician performing surgery? DR Armanda Magic, [facility]  6. What is your office phone number [phone] 7.   What is your office fax number? [phone]  8.   Anesthesia type (None, local, MAC, general) ? LOCAL   PATIENT IS IN THE OFFICE    Kevin Melton 01/16/2019, 10:39 AM  _________________________________________________________________   (provider comments below)

## 2019-01-17 ENCOUNTER — Other Ambulatory Visit: Payer: Self-pay | Admitting: Family Medicine

## 2019-02-12 ENCOUNTER — Ambulatory Visit: Payer: PPO | Admitting: Gastroenterology

## 2019-02-12 ENCOUNTER — Other Ambulatory Visit: Payer: Self-pay

## 2019-02-12 ENCOUNTER — Encounter: Payer: Self-pay | Admitting: Gastroenterology

## 2019-02-12 ENCOUNTER — Other Ambulatory Visit: Payer: Self-pay | Admitting: Gastroenterology

## 2019-02-12 VITALS — BP 129/58 | HR 64 | Temp 98.3°F | Ht 69.0 in | Wt 211.8 lb

## 2019-02-12 DIAGNOSIS — K200 Eosinophilic esophagitis: Secondary | ICD-10-CM | POA: Diagnosis not present

## 2019-02-12 DIAGNOSIS — K2210 Ulcer of esophagus without bleeding: Secondary | ICD-10-CM

## 2019-02-12 MED ORDER — FLUTICASONE PROPIONATE HFA 220 MCG/ACT IN AERO: INHALATION_SPRAY | RESPIRATORY_TRACT | 6 refills | Status: DC

## 2019-02-12 MED ORDER — OMEPRAZOLE 40 MG PO CPDR: 40.0000 mg | DELAYED_RELEASE_CAPSULE | Freq: Two times a day (BID) | ORAL | 2 refills | Status: DC

## 2019-02-12 NOTE — Progress Notes (Signed)
[name], MD [address]  [place]  Kevin Melton, [place] [address]  Main: [phone]  Fax: [phone]    Gastroenterology Consultation  Referring Provider:     Virginia Crews, MD Primary Care Physician:  Virginia Crews, MD Primary Gastroenterologist:  Dr. [name] Reason for Consultation:     Chronic dysphagia        HPI:   Kevin Melton is a 68 y.o. male referred by Dr. Brita Romp, Dionne Bucy, MD  for consultation & management of chronic dysphagia to solids. He experienced food impaction in 2016, went to ER, food bolus spontaneously passed after glucagon and soda.  Another epsidoe 23months later, went to ER again Did not have EGD during both episodes. Since then, he stayed away from hard foods due to dysphagia He is very careful of what he is eating, did not see GI until now due to lack of insurance Currently, having nocturnal heart burn if he has late dinner Currently not on PPI or H2 blocker He has mildly elevated serum creatinine, was thought to be secondary to ACE inhibitor and also, he had history of kidney stones. We do not have his baseline creatinine He does not smoke, or drink alcohol, denies NSAID use He is a Geophysicist/field seismologist by profession He denies any family history of GI malignancies  Follow-up visit 01/31/2017: Since his initial visit, he underwent EGD which revealed eosinophilic esophagitis and confirmed on biopsies. I started him on omeprazole 40 mg twice a day and he manages to take it 2 times daily most of the days. He tends to forget to take before breakfast. Today, he is accompanied by his wife and feels asymptomatic. He no longer has heartburn, regurgitation, dysphagia. On recent labs he is found to have elevated eosinophils in 600s  Follow-up visit 02/12/2019 Patient has not seen me for 2 years.  He has eosinophilic esophagitis for which he was maintained on omeprazole 40 mg twice daily.  I recommended follow-up EGD to assess  response to proton pump inhibitor which he did not.  He completed the prescription for omeprazole and did not contact my office since then.  He said he was asymptomatic for 1 year since last visit.  Within last 1 year, he started having episodes of food impactions, none of which he had to go to ER and spontaneously passed after receiving glucagon.  He also had intermittent episodes of food impactions at home. Since then he has been carefully eating and chewing well.  His weight has been stable.  GI Procedures: had a colonoscopy at age 54 and at age 74, reportedly normal EGD 01/05/2017 The duodenal bulb and second portion of the duodenum were normal. The entire examined stomach was normal. The cardia and gastric fundus were normal on retroflexion.  Mucosal changes including ringed esophagus, feline appearance, longitudinal furrows and congestion (edema) were found in the entire esophagus. Esophageal findings were graded using the Eosinophilic Esophagitis Endoscopic Reference Score (EoE-EREFS) as: Edema Grade 1 Present (decreased clarity or absence of vascular markings), Rings Grade 2 Moderate (distinct rings that do not occlude passage of diagnostic 8-10 mm endoscope), Exudates Grade 0 None (no white lesions seen), Furrows Grade 1 Present (vertical lines with or without visible depth) and Stricture none (no stricture found). Biopsies were obtained from the proximal and distal esophagus with cold forceps for histology of suspected eosinophilic esophagitis. LA Grade B (one or more mucosal breaks greater than 5 mm, not extending between the tops of two  mucosal folds) esophagitis with no bleeding was found 39 cm from the incisors.  Pathology: DIAGNOSIS:  A. DISTAL ESOPHAGUS; COLD BIOPSY:  - INCREASED INTRAEPITHELIAL EOSINOPHILS, UP TO 22 IN A HIGH-POWER FIELD,  SEE COMMENT.  - NEGATIVE FOR DYSPLASIA AND MALIGNANCY.   B. PROXIMAL ESOPHAGUS; COLD BIOPSY:  - INCREASED INTRAEPITHELIAL EOSINOPHILS, UP TO 23  IN A HIGH-POWER FIELD,  SEE COMMENT.  - NEGATIVE FOR DYSPLASIA AND MALIGNANCY.   Comment:  Sections show diffusely increased eosinophils with degranulation and  focal eosinophilic abscess formation. There is basal cell hyperplasia  and spongiosis. Changes in the proximal esophagus are slightly more  prominent than distal. These findings are nonspecific and may be seen in  GERD, eosinophilic esophagitis, systemic eosinophilic diseases and other  disorders. Combined features of GERD and eosinophilic esophagitis are  sometimes seen. Correlation with endoscopic findings is helpful to  distinguish between these diseases.   Past Medical History:  Diagnosis Date  . Abdominal hernia    umbilical  . Chronic kidney disease (CKD), active medical management without dialysis   . Chronic kidney disease, stage III (moderate)   . Coronary artery disease   . GERD (gastroesophageal reflux disease)   . History of kidney stones    h/o  . Kidney stones   . MI (myocardial infarction) [facility]) 2013   1 stent placed-sees Dr Estrella Myrtle) last seen in nov 2018    Past Surgical History:  Procedure Laterality Date  . COLONOSCOPY  2013  . CORONARY ANGIOPLASTY WITH STENT PLACEMENT  2013   [place]  . ESOPHAGOGASTRODUODENOSCOPY (EGD) WITH PROPOFOL N/A 01/05/2017   Procedure: ESOPHAGOGASTRODUODENOSCOPY (EGD) WITH PROPOFOL With Dilation;  Surgeon: Lin Landsman, MD;  Location: [facility] ENDOSCOPY;  Service: Gastroenterology;  Laterality: N/A;  . LITHOTRIPSY  2014  . UMBILICAL HERNIA REPAIR N/A 02/20/2017   Primary repair 2 cm umbilical hernia;  Surgeon: Robert Bellow, MD;  Location: [facility] ORS;  Service: General;  Laterality: N/A;     Current Outpatient Medications:  .  allopurinol (ZYLOPRIM) 300 MG tablet, Take 1 tablet (300 mg total) by mouth at bedtime., Disp: 90 tablet, Rfl: 3 .  amLODipine (NORVASC) 5 MG tablet, Take 1 tablet by mouth once daily, Disp: 90 tablet, Rfl: 3 .  aspirin 81 MG  chewable tablet, Chew 81 mg by mouth at bedtime. , Disp: , Rfl:  .  benazepril (LOTENSIN) 20 MG tablet, TAKE 1 TABLET BY MOUTH AT BEDTIME, Disp: 90 tablet, Rfl: 1 .  nitroGLYCERIN (NITROSTAT) 0.4 MG SL tablet, Place 0.4 mg under the tongue every 5 (five) minutes as needed for chest pain. , Disp: , Rfl:  .  atorvastatin (LIPITOR) 40 MG tablet, Take 1 tablet (40 mg total) by mouth daily., Disp: 90 tablet, Rfl: 3 .  fluticasone (FLOVENT HFA) 220 MCG/ACT inhaler, Take 2 puffs and swallow twice daily for 8 weeks. **RINSE MOUTH AFTER EACH USE** **DONT EAT 30 MINS AFTER**, Disp: 1 Inhaler, Rfl: 6 .  omeprazole (PRILOSEC) 40 MG capsule, Take 1 capsule (40 mg total) by mouth 2 (two) times daily., Disp: 60 capsule, Rfl: 2   Family History  Problem Relation Age of Onset  . Dementia Mother 34  . Lung disease Father 60       collapsed lung  . Healthy Sister   . Healthy Brother   . Heart Problems Brother   . Healthy Son   . Healthy Sister   . Colon cancer Neg Hx   . Prostate cancer Neg Hx   . Heart  disease Neg Hx      Social History   Tobacco Use  . Smoking status: Never Smoker  . Smokeless tobacco: Never Used  Substance Use Topics  . Alcohol use: No  . Drug use: No    Allergies as of 02/12/2019  . (No Known Allergies)    Review of Systems:    All systems reviewed and negative except where noted in HPI.   Physical Exam:  BP (!) 129/58   Pulse 64   Temp 98.3 F (36.8 C) (Oral)   Ht 5\' 9"  (1.753 m)   Wt 211 lb 12.8 oz (96.1 kg)   BMI 31.28 kg/m  No LMP for male patient.  General:   Alert,  Well-developed, well-nourished, pleasant and cooperative in NAD Head:  Normocephalic and atraumatic. Eyes:  Sclera clear, no icterus.   Conjunctiva pink. Ears:  Normal auditory acuity. Nose:  No deformity, discharge, or lesions. Mouth:  No deformity or lesions,oropharynx pink & moist. Neck:  Supple; no masses or thyromegaly. Lungs:  Respirations even and unlabored.  Clear throughout to  auscultation.   No wheezes, crackles, or rhonchi. No acute distress. Heart:  Regular rate and rhythm; no murmurs, clicks, rubs, or gallops. Abdomen:  Normal bowel sounds. Soft, obese,non-tender and non-distended, No hepatosplenomegaly noted.  No guarding or rebound tenderness.   Rectal: Nor performed Msk:  Symmetrical without gross deformities. Good, equal movement & strength bilaterally. Pulses:  Normal pulses noted. Extremities:  No clubbing or edema.  No cyanosis. Neurologic:  Alert and oriented x3;  grossly normal neurologically. Skin:  Intact without significant lesions or rashes. No jaundice. Psych:  Alert and cooperative. Normal mood and affect.  Imaging Studies: none  Assessment and Plan:   Reuben Staude Reddin is a 68 y.o. male with chronic dysphagia to solids, prior episodes of food impactions which spontaneously passed. Never had an EGD before. He also has Periumbilical and ventral hernias  1. Chronic dysphagia: Due to eosinophilic esophagitis, biopsy proven EREFS score 3, now with recurrence of dysphagia - Restart omeprazole 40mg  BID  - Recommend swallowed fluticasone 220 MCG twice daily - Recommend repeat EGD with biopsies in 2 months with biopsies to assess response to above therapy - Recheck Cr during next visit while on PPI. His Cr is stable currently  2. Peripheral blood eosinophilia - Recheck CBC with differential - If eosinophils >1500cells/microL, will refer to hematology for work up hypereosinophilic syndrome  3. Colonoscopy at age 65  Follow up in 69months   [name], MD

## 2019-02-13 LAB — CBC WITH DIFFERENTIAL/PLATELET
Basophils Absolute: 0.1 10*3/uL (ref 0.0–0.2)
Basos: 1 %
EOS (ABSOLUTE): 0.5 10*3/uL — ABNORMAL HIGH (ref 0.0–0.4)
Eos: 7 %
Hematocrit: 42.9 % (ref 37.5–51.0)
Hemoglobin: 14.9 g/dL (ref 13.0–17.7)
Immature Grans (Abs): 0 10*3/uL (ref 0.0–0.1)
Immature Granulocytes: 0 %
Lymphocytes Absolute: 2.2 10*3/uL (ref 0.7–3.1)
Lymphs: 29 %
MCH: 30.4 pg (ref 26.6–33.0)
MCHC: 34.7 g/dL (ref 31.5–35.7)
MCV: 88 fL (ref 79–97)
Monocytes Absolute: 0.6 10*3/uL (ref 0.1–0.9)
Monocytes: 8 %
Neutrophils Absolute: 4.2 10*3/uL (ref 1.4–7.0)
Neutrophils: 55 %
Platelets: 245 10*3/uL (ref 150–450)
RBC: 4.9 x10E6/uL (ref 4.14–5.80)
RDW: 13.1 % (ref 11.6–15.4)
WBC: 7.6 10*3/uL (ref 3.4–10.8)

## 2019-03-04 NOTE — Progress Notes (Signed)
Office Visit    Patient Name: Kevin Melton Date of Encounter: 03/05/2019  Primary Care Provider:  Virginia Crews, MD Primary Cardiologist:  Nelva Bush, MD Electrophysiologist:  None   Chief Complaint    Kevin Melton is a 68 y.o. male with a hx of CAD s/p BMS to RCA 2013 in setting of inferior MI, HTN, CKD 3, gout, HLD, palpitations presents today for 54-month follow-up of CAD, HLD  Past Medical History    Past Medical History:  Diagnosis Date  . Abdominal hernia    umbilical  . Chronic kidney disease (CKD), active medical management without dialysis   . Chronic kidney disease, stage III (moderate)   . Coronary artery disease   . GERD (gastroesophageal reflux disease)   . History of kidney stones    h/o  . Kidney stones   . MI (myocardial infarction) [facility] 2013   1 stent placed-sees Dr Estrella Myrtle) last seen in nov 2018   Past Surgical History:  Procedure Laterality Date  . COLONOSCOPY  2013  . CORONARY ANGIOPLASTY WITH STENT PLACEMENT  2013   [place]  . ESOPHAGOGASTRODUODENOSCOPY (EGD) WITH PROPOFOL N/A 01/05/2017   Procedure: ESOPHAGOGASTRODUODENOSCOPY (EGD) WITH PROPOFOL With Dilation;  Surgeon: Lin Landsman, MD;  Location: [facility] ENDOSCOPY;  Service: Gastroenterology;  Laterality: N/A;  . LITHOTRIPSY  2014  . UMBILICAL HERNIA REPAIR N/A 02/20/2017   Primary repair 2 cm umbilical hernia;  Surgeon: Robert Bellow, MD;  Location: [facility] ORS;  Service: General;  Laterality: N/A;    Allergies  No Known Allergies  History of Present Illness    Kevin Melton is a 68 y.o. male with a hx of CAD s/p BMS to RCA 2013 in setting of inferior MI, HTN, CKD 3, gout, HLD, palpitation last seen 07/26/2018 by Dr. Saunders Revel.  Initially seen by Dr. And 01/2017 at which time he was doing well other than brief episodes of palpitations.  His pravastatin was switched to atorvastatin 40 mg daily for optimization of LDL.  He works as a  Geophysicist/field seismologist.  He follows with his primary care provider as well as his nephrologist.  He reports feeling well.  Tells me he and his wife recently bought a house and they have been working on many small projects around the house.  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  He does not check his blood pressure routinely at home.  Tells me it is always good when checked by healthcare providers.  His bradycardia is without symptoms, reports no lightheadedness, dizziness, near-syncope, syncope.  He is a new finding of a right bundle branch block on EKG today and and we discussed this.   He has not been exercising recently but tries to stay very active around the house.  He previously was walking 4 miles per day without difficulty.  Tells me he is going to try to get back to walking regimen.  Endorses eating a heart healthy diet, avoid salt, eats mostly at home.  EKGs/Labs/Other Studies Reviewed:   The following studies were reviewed today:  EKG:  EKG is ordered today.  The ekg ordered today demonstrates SB rate 53 bpm with new RBBB.  Recent Labs: 10/26/2018: ALT 19; BUN 16; Creatinine, Ser 1.26; Potassium 4.8; Sodium 141 02/12/2019: Hemoglobin 14.9; Platelets 245  Recent Lipid Panel    Component Value Date/Time   CHOL 116 10/26/2018 0944   TRIG 130 10/26/2018 0944  HDL 33 (L) 10/26/2018 0944   CHOLHDL 3.6 01/31/2017 1049   VLDL 26 01/31/2017 1049   LDLCALC 57 10/26/2018 0944   LDLCALC 90 11/10/2016 1630    Home Medications   Current Meds  Medication Sig  . allopurinol (ZYLOPRIM) 300 MG tablet Take 1 tablet (300 mg total) by mouth at bedtime.  Marland Kitchen amLODipine (NORVASC) 5 MG tablet Take 1 tablet by mouth once daily  . aspirin 81 MG chewable tablet Chew 81 mg by mouth at bedtime.   . benazepril (LOTENSIN) 20 MG tablet TAKE 1 TABLET BY MOUTH AT BEDTIME  . fluticasone (FLOVENT HFA) 220 MCG/ACT inhaler Take 2 puffs and swallow  twice daily for 8 weeks. **RINSE MOUTH AFTER EACH USE** **DONT EAT 30 MINS AFTER**  . nitroGLYCERIN (NITROSTAT) 0.4 MG SL tablet Place 0.4 mg under the tongue every 5 (five) minutes as needed for chest pain.   Marland Kitchen omeprazole (PRILOSEC) 40 MG capsule Take 1 capsule (40 mg total) by mouth 2 (two) times daily.    Review of Systems   Review of Systems  Constitution: Negative for chills, fever and malaise/fatigue.  Cardiovascular: Negative for chest pain, dyspnea on exertion, leg swelling, near-syncope, orthopnea, palpitations and syncope.  Respiratory: Negative for cough, shortness of breath and wheezing.   Gastrointestinal: Negative for nausea and vomiting.  Neurological: Negative for dizziness, light-headedness and weakness.   All other systems reviewed and are otherwise negative except as noted above.  Physical Exam    VS:  BP 130/62 (BP Location: Left Arm, Patient Position: Sitting, Cuff Size: Normal)   Pulse (!) 53   Ht 5\' 9"  (1.753 m)   Wt 211 lb (95.7 kg)   SpO2 97%   BMI 31.16 kg/m  , BMI Body mass index is 31.16 kg/m. GEN: Well nourished, well developed, in no acute distress. HEENT: normal. Neck: Supple, no JVD, carotid bruits, or masses. Cardiac: RRR, no murmurs, rubs, or gallops. No clubbing, cyanosis, edema.  Radials/DP/PT 2+ and equal bilaterally.  Respiratory:  Respirations regular and unlabored, clear to auscultation bilaterally. GI: Soft, nontender, nondistended, BS + x 4. MS: No deformity or atrophy. Skin: Warm and dry, no rash. Neuro:  Strength and sensation are intact. Psych: Normal affect.  Accessory Clinical Findings    ECG personally reviewed by me today - SB rate 53 with new RBBB - no acute ST/T wave changes.  Assessment & Plan    1. CAD - Stable with no anginal symptoms. GDMT aspirin, statin. No beta blocker secondary to baseline bradycardia.  Encourage regular cardiovascular exercise.  Continue low-sodium, heart healthy diet.  2. HTN - BP well  controlled. Continue present antihypertensive regimen as prescribed by his PCP including Norvasc 5 mg, benazepril 20 mg  3. HLD - Lipid profile 10/26/18 total 116, HDL 33, LDL 57, triglycerides 130.  LDL at goal of less than 70.  Continue Atorvastatin 40mg  daily.  4. CKD3 - Follows with nephrology. Careful titration of medications.  5. RBBB - New finding on EKG today. No lightheadedness, dizziness. Continue to monitor with EKG q6-12 months.   Disposition: Follow up in 6 month(s) with Dr. Saunders Revel of [facility].   Loel Dubonnet, NP 03/05/2019, 8:51 AM

## 2019-03-05 ENCOUNTER — Other Ambulatory Visit: Payer: Self-pay

## 2019-03-05 ENCOUNTER — Ambulatory Visit (INDEPENDENT_AMBULATORY_CARE_PROVIDER_SITE_OTHER): Payer: PPO | Admitting: Family

## 2019-03-05 ENCOUNTER — Encounter: Payer: Self-pay | Admitting: Family

## 2019-03-05 VITALS — BP 130/62 | HR 53 | Ht 69.0 in | Wt 211.0 lb

## 2019-03-05 DIAGNOSIS — I10 Essential (primary) hypertension: Secondary | ICD-10-CM

## 2019-03-05 DIAGNOSIS — N183 Chronic kidney disease, stage 3 unspecified: Secondary | ICD-10-CM

## 2019-03-05 DIAGNOSIS — I2510 Atherosclerotic heart disease of native coronary artery without angina pectoris: Secondary | ICD-10-CM | POA: Diagnosis not present

## 2019-03-05 DIAGNOSIS — I4510 Unspecified right bundle-branch block: Secondary | ICD-10-CM

## 2019-03-05 DIAGNOSIS — E782 Mixed hyperlipidemia: Secondary | ICD-10-CM | POA: Diagnosis not present

## 2019-03-05 NOTE — Patient Instructions (Signed)
Medication Instructions:  No medication changes today.  *If you need a refill on your cardiac medications before your next appointment, please call your pharmacy*  Lab Work: No lab work today.   If you have labs (blood work) drawn today and your tests are completely normal, you will receive your results only by: Marland Kitchen MyChart Message (if you have MyChart) OR . A paper copy in the mail If you have any lab test that is abnormal or we need to change your treatment, we will call you to review the results.  Testing/Procedures: You had an EKG today. It shows a right bundle branch block. This is a slowing of one of the three electrical pathways in your heart. This is not concerning and is a very common finding.   Follow-Up: At [facility], you and your health needs are our priority.  As part of our continuing mission to provide you with exceptional heart care, we have created designated Provider Care Teams.  These Care Teams include your primary Cardiologist (physician) and Advanced Practice Providers (APPs -  Physician Assistants and Nurse Practitioners) who all work together to provide you with the care you need, when you need it.  Your next appointment:   6 month(s)  The format for your next appointment:   Either In Person or Virtual  Provider:    You may see Nelva Bush, MD or one of the following Advanced Practice Providers on your designated Care Team:    Murray Hodgkins, NP  Christell Faith, PA-C  Marrianne Mood, PA-C   Other Instructions  DASH Eating Plan DASH stands for "Dietary Approaches to Stop Hypertension." The DASH eating plan is a healthy eating plan that has been shown to reduce high blood pressure (hypertension). It may also reduce your risk for type 2 diabetes, heart disease, and stroke. The DASH eating plan may also help with weight loss. What are tips for following this plan?  General guidelines  Avoid eating more than 2,300 mg (milligrams) of salt (sodium)  a day. If you have hypertension, you may need to reduce your sodium intake to 1,500 mg a day.  Limit alcohol intake to no more than 1 drink a day for nonpregnant women and 2 drinks a day for men. One drink equals 12 oz of beer, 5 oz of wine, or 1 oz of hard liquor.  Work with your health care provider to maintain a healthy body weight or to lose weight. Ask what an ideal weight is for you.  Get at least 30 minutes of exercise that causes your heart to beat faster (aerobic exercise) most days of the week. Activities may include walking, swimming, or biking.  Work with your health care provider or diet and nutrition specialist (dietitian) to adjust your eating plan to your individual calorie needs. Reading food labels   Check food labels for the amount of sodium per serving. Choose foods with less than 5 percent of the Daily Value of sodium. Generally, foods with less than 300 mg of sodium per serving fit into this eating plan.  To find whole grains, look for the word "whole" as the first word in the ingredient list. Shopping  Buy products labeled as "low-sodium" or "no salt added."  Buy fresh foods. Avoid canned foods and premade or frozen meals. Cooking  Avoid adding salt when cooking. Use salt-free seasonings or herbs instead of table salt or sea salt. Check with your health care provider or pharmacist before using salt substitutes.  Do not fry  foods. Lacinda Axon foods using healthy methods such as baking, boiling, grilling, and broiling instead.  Cook with heart-healthy oils, such as olive, canola, soybean, or sunflower oil. Meal planning  Eat a balanced diet that includes: ? 5 or more servings of fruits and vegetables each day. At each meal, try to fill half of your plate with fruits and vegetables. ? Up to 6-8 servings of whole grains each day. ? Less than 6 oz of lean meat, poultry, or fish each day. A 3-oz serving of meat is about the same size as a deck of cards. One egg equals 1  oz. ? 2 servings of low-fat dairy each day. ? A serving of nuts, seeds, or beans 5 times each week. ? Heart-healthy fats. Healthy fats called Omega-3 fatty acids are found in foods such as flaxseeds and coldwater fish, like sardines, salmon, and mackerel.  Limit how much you eat of the following: ? Canned or prepackaged foods. ? Food that is high in trans fat, such as fried foods. ? Food that is high in saturated fat, such as fatty meat. ? Sweets, desserts, sugary drinks, and other foods with added sugar. ? Full-fat dairy products.  Do not salt foods before eating.  Try to eat at least 2 vegetarian meals each week.  Eat more home-cooked food and less restaurant, buffet, and fast food.  When eating at a restaurant, ask that your food be prepared with less salt or no salt, if possible. What foods are recommended? The items listed may not be a complete list. Talk with your dietitian about what dietary choices are best for you. Grains Whole-grain or whole-wheat bread. Whole-grain or whole-wheat pasta. Brown rice. Modena Morrow. Bulgur. Whole-grain and low-sodium cereals. Pita bread. Low-fat, low-sodium crackers. Whole-wheat flour tortillas. Vegetables Fresh or frozen vegetables (raw, steamed, roasted, or grilled). Low-sodium or reduced-sodium tomato and vegetable juice. Low-sodium or reduced-sodium tomato sauce and tomato paste. Low-sodium or reduced-sodium canned vegetables. Fruits All fresh, dried, or frozen fruit. Canned fruit in natural juice (without added sugar). Meat and other protein foods Skinless chicken or [place]. Ground chicken or [place]. Pork with fat trimmed off. Fish and seafood. Egg whites. Dried beans, peas, or lentils. Unsalted nuts, nut butters, and seeds. Unsalted canned beans. Lean cuts of beef with fat trimmed off. Low-sodium, lean deli meat. Dairy Low-fat (1%) or fat-free (skim) milk. Fat-free, low-fat, or reduced-fat cheeses. Nonfat, low-sodium ricotta or cottage  cheese. Low-fat or nonfat yogurt. Low-fat, low-sodium cheese. Fats and oils Soft margarine without trans fats. Vegetable oil. Low-fat, reduced-fat, or light mayonnaise and salad dressings (reduced-sodium). Canola, safflower, olive, soybean, and sunflower oils. Avocado. Seasoning and other foods Herbs. Spices. Seasoning mixes without salt. Unsalted popcorn and pretzels. Fat-free sweets. What foods are not recommended? The items listed may not be a complete list. Talk with your dietitian about what dietary choices are best for you. Grains Baked goods made with fat, such as croissants, muffins, or some breads. Dry pasta or rice meal packs. Vegetables Creamed or fried vegetables. Vegetables in a cheese sauce. Regular canned vegetables (not low-sodium or reduced-sodium). Regular canned tomato sauce and paste (not low-sodium or reduced-sodium). Regular tomato and vegetable juice (not low-sodium or reduced-sodium). Angie Fava. Olives. Fruits Canned fruit in a light or heavy syrup. Fried fruit. Fruit in cream or butter sauce. Meat and other protein foods Fatty cuts of meat. Ribs. Fried meat. Berniece Salines. Sausage. Bologna and other processed lunch meats. Salami. Fatback. Hotdogs. Bratwurst. Salted nuts and seeds. Canned beans with added salt. Canned  or smoked fish. Whole eggs or egg yolks. Chicken or [place] with skin. Dairy Whole or 2% milk, cream, and half-and-half. Whole or full-fat cream cheese. Whole-fat or sweetened yogurt. Full-fat cheese. Nondairy creamers. Whipped toppings. Processed cheese and cheese spreads. Fats and oils Butter. Stick margarine. Lard. Shortening. Ghee. Bacon fat. Tropical oils, such as coconut, palm kernel, or palm oil. Seasoning and other foods Salted popcorn and pretzels. Onion salt, garlic salt, seasoned salt, table salt, and sea salt. Worcestershire sauce. Tartar sauce. Barbecue sauce. Teriyaki sauce. Soy sauce, including reduced-sodium. Steak sauce. Canned and packaged gravies.  Fish sauce. Oyster sauce. Cocktail sauce. Horseradish that you find on the shelf. Ketchup. Mustard. Meat flavorings and tenderizers. Bouillon cubes. Hot sauce and Tabasco sauce. Premade or packaged marinades. Premade or packaged taco seasonings. Relishes. Regular salad dressings. Where to find more information:  National Heart, Lung, and [place]: https://wilson-eaton.com/  American Heart Association: www.heart.org Summary  The DASH eating plan is a healthy eating plan that has been shown to reduce high blood pressure (hypertension). It may also reduce your risk for type 2 diabetes, heart disease, and stroke.  With the DASH eating plan, you should limit salt (sodium) intake to 2,300 mg a day. If you have hypertension, you may need to reduce your sodium intake to 1,500 mg a day.  When on the DASH eating plan, aim to eat more fresh fruits and vegetables, whole grains, lean proteins, low-fat dairy, and heart-healthy fats.  Work with your health care provider or diet and nutrition specialist (dietitian) to adjust your eating plan to your individual calorie needs. This information is not intended to replace advice given to you by your health care provider. Make sure you discuss any questions you have with your health care provider. Document Released: 02/10/2011 Document Revised: 02/03/2017 Document Reviewed: 02/15/2016 Elsevier Patient Education  2020 [employer].

## 2019-04-16 ENCOUNTER — Other Ambulatory Visit: Payer: Self-pay | Admitting: Family Medicine

## 2019-04-16 MED ORDER — BENAZEPRIL HCL 20 MG PO TABS: 20.0000 mg | ORAL_TABLET | Freq: Every day | ORAL | 0 refills | Status: DC

## 2019-04-16 NOTE — Telephone Encounter (Signed)
Medication Refill - Medication: benazepril (LOTENSIN) 20 MG tablet    Has the patient contacted their pharmacy? Yes.   (Agent: If no, request that the patient contact the pharmacy for the refill.) (Agent: If yes, when and what did the pharmacy advise?)  Preferred Pharmacy (with phone number or street name):  [id number] [name]  [address] [place] [address]  Phone: [phone] Fax: [phone]     Agent: Please be advised that RX refills may take up to 3 business days. We ask that you follow-up with your pharmacy.

## 2019-04-22 NOTE — Progress Notes (Signed)
Subjective:   Kevin Melton is a 69 y.o. male who presents for Medicare Annual/Subsequent preventive examination.  Review of Systems:  N/A  Cardiac Risk Factors include: advanced age (>32men, >88 women);dyslipidemia;hypertension;male gender     Objective:    Vitals: BP 130/64 (BP Location: Right Arm)   Pulse (!) 54   Temp 98.3 F (36.8 C) (Oral)   Ht 5\' 9"  (1.753 m)   Wt 220 lb 9.6 oz (100.1 kg)   BMI 32.58 kg/m   Body mass index is 32.58 kg/m.  Advanced Directives 04/23/2019 04/19/2018 12/23/2017 02/20/2017 02/10/2017 01/05/2017 11/10/2016  Does Patient Have a Medical Advance Directive? No No No No No No No  Would patient like information on creating a medical advance directive? No - Patient declined No - Patient declined - No - Patient declined - No - Patient declined -    Tobacco Social History   Tobacco Use  Smoking Status Never Smoker  Smokeless Tobacco Never Used     Counseling given: Not Answered   Clinical Intake:  Pre-visit preparation completed: Yes  Pain : No/denies pain Pain Score: 0-No pain     Nutritional Status: BMI > 30  Obese Nutritional Risks: None Diabetes: No  How often do you need to have someone help you when you read instructions, pamphlets, or other written materials from your doctor or pharmacy?: 1 - Never  Interpreter Needed?: No  Information entered by :: [facility], LPN  Past Medical History:  Diagnosis Date  . Abdominal hernia    umbilical  . Chronic kidney disease (CKD), active medical management without dialysis   . Chronic kidney disease, stage III (moderate)   . Coronary artery disease   . GERD (gastroesophageal reflux disease)   . History of kidney stones    h/o  . Kidney stones   . MI (myocardial infarction) [facility]) 2013   1 stent placed-sees Dr Estrella Myrtle) last seen in nov 2018   Past Surgical History:  Procedure Laterality Date  . COLONOSCOPY  2013  . CORONARY ANGIOPLASTY WITH STENT PLACEMENT  2013   [place]  . ESOPHAGOGASTRODUODENOSCOPY (EGD) WITH PROPOFOL N/A 01/05/2017   Procedure: ESOPHAGOGASTRODUODENOSCOPY (EGD) WITH PROPOFOL With Dilation;  Surgeon: Lin Landsman, MD;  Location: [facility] ENDOSCOPY;  Service: Gastroenterology;  Laterality: N/A;  . LITHOTRIPSY  2014  . UMBILICAL HERNIA REPAIR N/A 02/20/2017   Primary repair 2 cm umbilical hernia;  Surgeon: Robert Bellow, MD;  Location: [facility] ORS;  Service: General;  Laterality: N/A;   Family History  Problem Relation Age of Onset  . Dementia Mother 29  . Lung disease Father 55       collapsed lung  . Healthy Sister   . Healthy Brother   . Heart Problems Brother   . Healthy Son   . Healthy Sister   . Colon cancer Neg Hx   . Prostate cancer Neg Hx   . Heart disease Neg Hx    Social History   Socioeconomic History  . Marital status: Married    Spouse name: [name]  . Number of children: 1  . Years of education: 46  . Highest education level: High school graduate  Occupational History  . Occupation: Self Employed    Comment: [employer]: semi-retired  Tobacco Use  . Smoking status: Never Smoker  . Smokeless tobacco: Never Used  Substance and Sexual Activity  . Alcohol use: No  . Drug use: No  . Sexual activity: Yes  Partners: Female  Other Topics Concern  . Not on file  Social History Narrative  . Not on file   Social Determinants of Health   Financial Resource Strain: Low Risk   . Difficulty of Paying Living Expenses: Not hard at all  Food Insecurity: No Food Insecurity  . Worried About Charity fundraiser in the Last Year: Never true  . Ran Out of Food in the Last Year: Never true  Transportation Needs: No Transportation Needs  . Lack of Transportation (Medical): No  . Lack of Transportation (Non-Medical): No  Physical Activity: Inactive  . Days of Exercise per Week: 0 days  . Minutes of Exercise per Session: 0 min  Stress: No Stress Concern Present  . Feeling  of Stress : Not at all  Social Connections: Somewhat Isolated  . Frequency of Communication with Friends and Family: More than three times a week  . Frequency of Social Gatherings with Friends and Family: Never  . Attends Religious Services: Never  . Active Member of Clubs or Organizations: No  . Attends Archivist Meetings: Never  . Marital Status: Married    Outpatient Encounter Medications as of 04/23/2019  Medication Sig  . allopurinol (ZYLOPRIM) 300 MG tablet Take 1 tablet (300 mg total) by mouth at bedtime.  Marland Kitchen amLODipine (NORVASC) 5 MG tablet Take 1 tablet by mouth once daily  . aspirin 81 MG chewable tablet Chew 81 mg by mouth at bedtime.   Marland Kitchen atorvastatin (LIPITOR) 40 MG tablet Take 1 tablet (40 mg total) by mouth daily.  . benazepril (LOTENSIN) 20 MG tablet Take 1 tablet (20 mg total) by mouth at bedtime.  . nitroGLYCERIN (NITROSTAT) 0.4 MG SL tablet Place 0.4 mg under the tongue every 5 (five) minutes as needed for chest pain.   Marland Kitchen omeprazole (PRILOSEC) 40 MG capsule Take 1 capsule (40 mg total) by mouth 2 (two) times daily.  . fluticasone (FLOVENT HFA) 220 MCG/ACT inhaler Take 2 puffs and swallow twice daily for 8 weeks. **RINSE MOUTH AFTER EACH USE** **DONT EAT 30 MINS AFTER** (Patient not taking: Reported on 04/23/2019)   No facility-administered encounter medications on file as of 04/23/2019.    Activities of Daily Living In your present state of health, do you have any difficulty performing the following activities: 04/23/2019 10/26/2018  Hearing? Y N  Comment Does not wear hearing aids. Plans to f/u with an audiologist. -  Vision? N N  Difficulty concentrating or making decisions? N N  Walking or climbing stairs? N N  Dressing or bathing? N N  Doing errands, shopping? N N  Preparing Food and eating ? N -  Using the Toilet? N -  In the past six months, have you accidently leaked urine? N -  Do you have problems with loss of bowel control? N -  Managing your  Medications? N -  Managing your Finances? N -  Housekeeping or managing your Housekeeping? N -  Some recent data might be hidden    Patient Care Team: Virginia Crews, MD as PCP - General (Family Medicine) End, Harrell Gave, MD as PCP - Cardiology (Cardiology) Murlean Iba, MD (Nephrology)   Assessment:   This is a routine wellness examination for [place].  Exercise Activities and Dietary recommendations Current Exercise Habits: The patient does not participate in regular exercise at present, Exercise limited by: None identified  Goals    . Exercise 3x per week (30 min per time)     Recommend to start fast  walking three times a week for at least 30 minutes at a time.        Fall Risk: Fall Risk  04/23/2019 04/19/2018 03/21/2017 11/10/2016  Falls in the past year? 0 0 No No  Number falls in past yr: 0 - - -  Injury with Fall? 0 - - -    FALL RISK PREVENTION PERTAINING TO THE HOME:  Any stairs in or around the home? No  If so, are there any without handrails? N/A  Home free of loose throw rugs in walkways, pet beds, electrical cords, etc? Yes  Adequate lighting in your home to reduce risk of falls? Yes   ASSISTIVE DEVICES UTILIZED TO PREVENT FALLS:  Life alert? No  Use of a cane, walker or w/c? No  Grab bars in the bathroom? No  Shower chair or bench in shower? No  Elevated toilet seat or a handicapped toilet? No   TIMED UP AND GO:  Was the test performed? No .    Depression Screen PHQ 2/9 Scores 04/23/2019 10/26/2018 04/19/2018 04/19/2018  PHQ - 2 Score 0 0 0 0  PHQ- 9 Score - 0 0 -    Cognitive Function: Declined today.     6CIT Screen 04/19/2018  What Year? 0 points  What month? 0 points  What time? 0 points  Count back from 20 0 points  Months in reverse 0 points  Repeat phrase 0 points  Total Score 0    Immunization History  Administered Date(s) Administered  . Fluad Quad(high Dose 65+) 10/26/2018  . Influenza, High Dose Seasonal PF 11/10/2016,  12/19/2017  . Pneumococcal Conjugate-13 11/10/2016  . Pneumococcal Polysaccharide-23 04/19/2018    Qualifies for Shingles Vaccine? Yes . Due for Shingrix. Pt has been advised to call insurance company to determine out of pocket expense. Advised may also receive vaccine at local pharmacy or Health Dept. Verbalized acceptance and understanding.  Tdap: Although this vaccine is not a covered service during a Wellness Exam, does the patient still wish to receive this vaccine today?  No . Advised may receive this vaccine at local pharmacy or Health Dept. Aware to provide a copy of the vaccination record if obtained from local pharmacy or Health Dept. Verbalized acceptance and understanding.  Flu Vaccine: Up to date  Pneumococcal Vaccine: Completed series  Screening Tests Health Maintenance  Topic Date Due  . TETANUS/TDAP  04/22/2020 (Originally 10/22/1969)  . Fecal DNA (Cologuard)  04/06/2020  . INFLUENZA VACCINE  Completed  . Hepatitis C Screening  Completed  . PNA vac Low Risk Adult  Completed   Cancer Screenings:  Colorectal Screening: Cologuard completed 04/06/17. Repeat every 3 years.   Lung Cancer Screening: (Low Dose CT Chest recommended if Age 67-80 years, 30 pack-year currently smoking OR have quit w/in 15years.) does not qualify.   Additional Screening:  Hepatitis C Screening: Up to date  Vision Screening: Recommended annual ophthalmology exams for early detection of glaucoma and other disorders of the eye.  Dental Screening: Recommended annual dental exams for proper oral hygiene  Community Resource Referral:  CRR required this visit?  No        Plan:  I have personally reviewed and addressed the Medicare Annual Wellness questionnaire and have noted the following in the patient's chart:  A. Medical and social history B. Use of alcohol, tobacco or illicit drugs  C. Current medications and supplements D. Functional ability and status E.  Nutritional status F.    Physical activity G. Advance directives  H. List of other physicians I.  Hospitalizations, surgeries, and ER visits in previous 12 months J.  Branch such as hearing and vision if needed, cognitive and depression L. Referrals and appointments   In addition, I have reviewed and discussed with patient certain preventive protocols, quality metrics, and best practice recommendations. A written personalized care plan for preventive services as well as general preventive health recommendations were provided to patient.   [place] Score, [place]  [id number] Nurse Health Advisor   Nurse Notes: None.

## 2019-04-23 ENCOUNTER — Ambulatory Visit (INDEPENDENT_AMBULATORY_CARE_PROVIDER_SITE_OTHER): Payer: PPO | Admitting: Family Medicine

## 2019-04-23 ENCOUNTER — Other Ambulatory Visit: Payer: Self-pay

## 2019-04-23 ENCOUNTER — Ambulatory Visit (INDEPENDENT_AMBULATORY_CARE_PROVIDER_SITE_OTHER): Payer: PPO

## 2019-04-23 ENCOUNTER — Encounter: Payer: Self-pay | Admitting: Family Medicine

## 2019-04-23 ENCOUNTER — Ambulatory Visit: Payer: PPO | Admitting: Gastroenterology

## 2019-04-23 VITALS — BP 130/64 | HR 54 | Temp 98.3°F | Ht 69.0 in | Wt 220.0 lb

## 2019-04-23 VITALS — BP 130/64 | HR 54 | Temp 98.3°F | Ht 69.0 in | Wt 220.6 lb

## 2019-04-23 DIAGNOSIS — N1831 Chronic kidney disease, stage 3a: Secondary | ICD-10-CM

## 2019-04-23 DIAGNOSIS — I252 Old myocardial infarction: Secondary | ICD-10-CM | POA: Diagnosis not present

## 2019-04-23 DIAGNOSIS — I10 Essential (primary) hypertension: Secondary | ICD-10-CM

## 2019-04-23 DIAGNOSIS — M1A09X0 Idiopathic chronic gout, multiple sites, without tophus (tophi): Secondary | ICD-10-CM

## 2019-04-23 DIAGNOSIS — K200 Eosinophilic esophagitis: Secondary | ICD-10-CM

## 2019-04-23 DIAGNOSIS — I2510 Atherosclerotic heart disease of native coronary artery without angina pectoris: Secondary | ICD-10-CM

## 2019-04-23 DIAGNOSIS — L301 Dyshidrosis [pompholyx]: Secondary | ICD-10-CM | POA: Diagnosis not present

## 2019-04-23 DIAGNOSIS — E782 Mixed hyperlipidemia: Secondary | ICD-10-CM

## 2019-04-23 DIAGNOSIS — Z0000 Encounter for general adult medical examination without abnormal findings: Secondary | ICD-10-CM | POA: Diagnosis not present

## 2019-04-23 MED ORDER — TRIAMCINOLONE ACETONIDE 0.5 % EX OINT: 1.0000 "application " | TOPICAL_OINTMENT | Freq: Two times a day (BID) | CUTANEOUS | 3 refills | Status: DC

## 2019-04-23 NOTE — Assessment & Plan Note (Signed)
Followed by cardiology Doing well and asymptomatic Continue aspirin, statin, antihypertensives Nitroglycerin as needed

## 2019-04-23 NOTE — Assessment & Plan Note (Signed)
Followed by Nephrology Recheck metabolic panel Avoid nephrotoxic meds 

## 2019-04-23 NOTE — Assessment & Plan Note (Signed)
Followed by GI S/p flovent treatment Doing well and asymptomatic Continue PPI

## 2019-04-23 NOTE — Assessment & Plan Note (Signed)
Well controlled Continue current medications Recheck metabolic panel F/u in 6 months  

## 2019-04-23 NOTE — Assessment & Plan Note (Signed)
Followed by cardiology Not on B blocker due to bradycardia No med changes today

## 2019-04-23 NOTE — Assessment & Plan Note (Signed)
Well controlled, goal LDL <70 Reviewed last lipid panel Continue atorvastatin at current dose

## 2019-04-23 NOTE — Assessment & Plan Note (Signed)
Related to excessive hand sanitizer use  Advised on hand cream  Triamcinolone oint BID prn

## 2019-04-23 NOTE — Patient Instructions (Signed)
Kevin Melton , Thank you for taking time to come for your Medicare Wellness Visit. I appreciate your ongoing commitment to your health goals. Please review the following plan we discussed and let me know if I can assist you in the future.   Screening recommendations/referrals: Colonoscopy: Cologuard up to date, due 04/06/20 Recommended yearly ophthalmology/optometry visit for glaucoma screening and checkup Recommended yearly dental visit for hygiene and checkup  Vaccinations: Influenza vaccine: Up to date Pneumococcal vaccine: Completed series Tdap vaccine: Pt declines today.  Shingles vaccine: Pt declines today.     Advanced directives: Advance directive discussed with you today. Even though you declined this today please call our office should you change your mind and we can give you the proper paperwork for you to fill out.  Conditions/risks identified: Recommend to start walking 3 days a week for at least 30 minutes at a time.   Next appointment: 9:40 AM today with Dr Brita Romp   Preventive Care 68 Years and Older, Male Preventive care refers to lifestyle choices and visits with your health care provider that can promote health and wellness. What does preventive care include?  A yearly physical exam. This is also called an annual well check.  Dental exams once or twice a year.  Routine eye exams. Ask your health care provider how often you should have your eyes checked.  Personal lifestyle choices, including:  Daily care of your teeth and gums.  Regular physical activity.  Eating a healthy diet.  Avoiding tobacco and drug use.  Limiting alcohol use.  Practicing safe sex.  Taking low doses of aspirin every day.  Taking vitamin and mineral supplements as recommended by your health care provider. What happens during an annual well check? The services and screenings done by your health care provider during your annual well check will depend on your age, overall health,  lifestyle risk factors, and family history of disease. Counseling  Your health care provider may ask you questions about your:  Alcohol use.  Tobacco use.  Drug use.  Emotional well-being.  Home and relationship well-being.  Sexual activity.  Eating habits.  History of falls.  Memory and ability to understand (cognition).  Work and work Statistician. Screening  You may have the following tests or measurements:  Height, weight, and BMI.  Blood pressure.  Lipid and cholesterol levels. These may be checked every 5 years, or more frequently if you are over 40 years old.  Skin check.  Lung cancer screening. You may have this screening every year starting at age 68 if you have a 30-pack-year history of smoking and currently smoke or have quit within the past 15 years.  Fecal occult blood test (FOBT) of the stool. You may have this test every year starting at age 68.  Flexible sigmoidoscopy or colonoscopy. You may have a sigmoidoscopy every 5 years or a colonoscopy every 10 years starting at age 68.  Prostate cancer screening. Recommendations will vary depending on your family history and other risks.  Hepatitis C blood test.  Hepatitis B blood test.  Sexually transmitted disease (STD) testing.  Diabetes screening. This is done by checking your blood sugar (glucose) after you have not eaten for a while (fasting). You may have this done every 1-3 years.  Abdominal aortic aneurysm (AAA) screening. You may need this if you are a current or former smoker.  Osteoporosis. You may be screened starting at age 55 if you are at high risk. Talk with your health care provider about  your test results, treatment options, and if necessary, the need for more tests. Vaccines  Your health care provider may recommend certain vaccines, such as:  Influenza vaccine. This is recommended every year.  Tetanus, diphtheria, and acellular pertussis (Tdap, Td) vaccine. You may need a Td booster  every 10 years.  Zoster vaccine. You may need this after age 18.  Pneumococcal 13-valent conjugate (PCV13) vaccine. One dose is recommended after age 68.  Pneumococcal polysaccharide (PPSV23) vaccine. One dose is recommended after age 62. Talk to your health care provider about which screenings and vaccines you need and how often you need them. This information is not intended to replace advice given to you by your health care provider. Make sure you discuss any questions you have with your health care provider. Document Released: 03/20/2015 Document Revised: 11/11/2015 Document Reviewed: 12/23/2014 Elsevier Interactive Patient Education  2017 [place] Prevention in the Home Falls can cause injuries. They can happen to people of all ages. There are many things you can do to make your home safe and to help prevent falls. What can I do on the outside of my home?  Regularly fix the edges of walkways and driveways and fix any cracks.  Remove anything that might make you trip as you walk through a door, such as a raised step or threshold.  Trim any bushes or trees on the path to your home.  Use bright outdoor lighting.  Clear any walking paths of anything that might make someone trip, such as rocks or tools.  Regularly check to see if handrails are loose or broken. Make sure that both sides of any steps have handrails.  Any raised decks and porches should have guardrails on the edges.  Have any leaves, snow, or ice cleared regularly.  Use sand or salt on walking paths during winter.  Clean up any spills in your garage right away. This includes oil or grease spills. What can I do in the bathroom?  Use night lights.  Install grab bars by the toilet and in the tub and shower. Do not use towel bars as grab bars.  Use non-skid mats or decals in the tub or shower.  If you need to sit down in the shower, use a plastic, non-slip stool.  Keep the floor dry. Clean up any  water that spills on the floor as soon as it happens.  Remove soap buildup in the tub or shower regularly.  Attach bath mats securely with double-sided non-slip rug tape.  Do not have throw rugs and other things on the floor that can make you trip. What can I do in the bedroom?  Use night lights.  Make sure that you have a light by your bed that is easy to reach.  Do not use any sheets or blankets that are too big for your bed. They should not hang down onto the floor.  Have a firm chair that has side arms. You can use this for support while you get dressed.  Do not have throw rugs and other things on the floor that can make you trip. What can I do in the kitchen?  Clean up any spills right away.  Avoid walking on wet floors.  Keep items that you use a lot in easy-to-reach places.  If you need to reach something above you, use a strong step stool that has a grab bar.  Keep electrical cords out of the way.  Do not use floor polish or wax  that makes floors slippery. If you must use wax, use non-skid floor wax.  Do not have throw rugs and other things on the floor that can make you trip. What can I do with my stairs?  Do not leave any items on the stairs.  Make sure that there are handrails on both sides of the stairs and use them. Fix handrails that are broken or loose. Make sure that handrails are as long as the stairways.  Check any carpeting to make sure that it is firmly attached to the stairs. Fix any carpet that is loose or worn.  Avoid having throw rugs at the top or bottom of the stairs. If you do have throw rugs, attach them to the floor with carpet tape.  Make sure that you have a light switch at the top of the stairs and the bottom of the stairs. If you do not have them, ask someone to add them for you. What else can I do to help prevent falls?  Wear shoes that:  Do not have high heels.  Have rubber bottoms.  Are comfortable and fit you well.  Are closed  at the toe. Do not wear sandals.  If you use a stepladder:  Make sure that it is fully opened. Do not climb a closed stepladder.  Make sure that both sides of the stepladder are locked into place.  Ask someone to hold it for you, if possible.  Clearly mark and make sure that you can see:  Any grab bars or handrails.  First and last steps.  Where the edge of each step is.  Use tools that help you move around (mobility aids) if they are needed. These include:  Canes.  Walkers.  Scooters.  Crutches.  Turn on the lights when you go into a dark area. Replace any light bulbs as soon as they burn out.  Set up your furniture so you have a clear path. Avoid moving your furniture around.  If any of your floors are uneven, fix them.  If there are any pets around you, be aware of where they are.  Review your medicines with your doctor. Some medicines can make you feel dizzy. This can increase your chance of falling. Ask your doctor what other things that you can do to help prevent falls. This information is not intended to replace advice given to you by your health care provider. Make sure you discuss any questions you have with your health care provider. Document Released: 12/18/2008 Document Revised: 07/30/2015 Document Reviewed: 03/28/2014 Elsevier Interactive Patient Education  2017 [employer].

## 2019-04-23 NOTE — Patient Instructions (Signed)

## 2019-04-23 NOTE — Assessment & Plan Note (Signed)
Chronic and well controlled No flares in 5 yrs Continue allopurinol Recheck uric acid level, goal less than 6

## 2019-04-23 NOTE — Progress Notes (Signed)
Patient: Kevin Melton Male    DOB: [id number]   69 y.o.   MRN: [id number] Visit Date: 04/23/2019  Today's Provider: Lavon Paganini, MD   Chief Complaint  Patient presents with  . Hypertension  . Hyperlipidemia   Subjective:     HPI  Hypertension, follow-up:  BP Readings from Last 3 Encounters:  04/23/19 130/64  03/05/19 130/62  02/12/19 (!) 129/58    He was last seen for hypertension 6 months ago.  BP at that visit was 125/73. Management since that visit includes No changes. He reports excellent compliance with treatment. He is not having side effects.  He is exercising. He is adherent to low salt diet.   Outside blood pressures are not being checked. He is experiencing none.  Patient denies chest pain, chest pressure/discomfort, lower extremity edema, palpitations and tachypnea.   Cardiovascular risk factors include advanced age (older than 70 for men, 44 for women), dyslipidemia, hypertension and male gender.  Use of agents associated with hypertension: none.     Weight trend: stable Wt Readings from Last 3 Encounters:  04/23/19 220 lb 9.6 oz (100.1 kg)  03/05/19 211 lb (95.7 kg)  02/12/19 211 lb 12.8 oz (96.1 kg)    Current diet: in general, a "healthy" diet    ------------------------------------------------------------------------    No Known Allergies   Current Outpatient Medications:  .  allopurinol (ZYLOPRIM) 300 MG tablet, Take 1 tablet (300 mg total) by mouth at bedtime., Disp: 90 tablet, Rfl: 3 .  amLODipine (NORVASC) 5 MG tablet, Take 1 tablet by mouth once daily, Disp: 90 tablet, Rfl: 3 .  aspirin 81 MG chewable tablet, Chew 81 mg by mouth at bedtime. , Disp: , Rfl:  .  atorvastatin (LIPITOR) 40 MG tablet, Take 1 tablet (40 mg total) by mouth daily., Disp: 90 tablet, Rfl: 3 .  benazepril (LOTENSIN) 20 MG tablet, Take 1 tablet (20 mg total) by mouth at bedtime., Disp: 90 tablet, Rfl: 0 .  fluticasone (FLOVENT HFA) 220 MCG/ACT  inhaler, Take 2 puffs and swallow twice daily for 8 weeks. **RINSE MOUTH AFTER EACH USE** **DONT EAT 30 MINS AFTER** (Patient not taking: Reported on 04/23/2019), Disp: 1 Inhaler, Rfl: 6 .  nitroGLYCERIN (NITROSTAT) 0.4 MG SL tablet, Place 0.4 mg under the tongue every 5 (five) minutes as needed for chest pain. , Disp: , Rfl:  .  omeprazole (PRILOSEC) 40 MG capsule, Take 1 capsule (40 mg total) by mouth 2 (two) times daily., Disp: 60 capsule, Rfl: 2  Review of Systems  Constitutional: Negative.   HENT: Positive for dental problem and hearing loss. Negative for congestion, drooling, ear discharge, ear pain, facial swelling, mouth sores, nosebleeds, postnasal drip, rhinorrhea, sinus pressure, sinus pain, sneezing, sore throat, tinnitus, trouble swallowing and voice change.   Eyes: Negative.   Respiratory: Negative.   Cardiovascular: Negative.   Gastrointestinal: Negative.   Endocrine: Negative.   Genitourinary: Negative.   Musculoskeletal: Negative.   Skin: Negative.   Neurological: Negative.   Hematological: Negative.   Psychiatric/Behavioral: Negative.     Social History   Tobacco Use  . Smoking status: Never Smoker  . Smokeless tobacco: Never Used  Substance Use Topics  . Alcohol use: No      Objective:   There were no vitals taken for this visit. There were no vitals filed for this visit.There is no height or weight on file to calculate BMI.   Physical Exam   No results found for any  visits on 04/23/19.     Assessment & Plan    Problem List Items Addressed This Visit      Cardiovascular and Mediastinum   Essential hypertension with goal blood pressure less than 130/80 - Primary    Well controlled Continue current medications Recheck metabolic panel F/u in 6 months       Relevant Orders   Basic Metabolic Panel (BMET)   Coronary artery disease involving native coronary artery of native heart without angina pectoris    Followed by cardiology Doing well and  asymptomatic Continue aspirin, statin, antihypertensives Nitroglycerin as needed        Digestive   Eosinophilic esophagitis    Followed by GI S/p flovent treatment Doing well and asymptomatic Continue PPI        Musculoskeletal and Integument   Dyshidrotic eczema    Related to excessive hand sanitizer use  Advised on hand cream  Triamcinolone oint BID prn        Genitourinary   Chronic kidney disease, stage III (moderate)    Followed by Nephrology  Recheck metabolic panel Avoid nephrotoxic meds      Relevant Orders   Basic Metabolic Panel (BMET)     Other   H/O acute myocardial infarction    Followed by cardiology Not on B blocker due to bradycardia No med changes today      Gout    Chronic and well controlled No flares in 5 yrs Continue allopurinol Recheck uric acid level, goal less than 6      Relevant Orders   Uric acid   Mixed hyperlipidemia    Well controlled, goal LDL <70 Reviewed last lipid panel Continue atorvastatin at current dose          Return in about 6 months (around 10/21/2019) for CPE.   The entirety of the information documented in the History of Present Illness, Review of Systems and Physical Exam were personally obtained by me. Portions of this information were initially documented by Ashley Royalty, CMA and reviewed by me for thoroughness and accuracy.    Andrena Margerum, Dionne Bucy, MD MPH [facility]

## 2019-04-23 NOTE — Progress Notes (Deleted)
       Patient: Kevin Melton Male    DOB: [id number]   69 y.o.   MRN: [id number] Visit Date: 04/23/2019  Today's Provider: Lavon Paganini, MD   No chief complaint on file.  Subjective:     HPI    Hypertension, follow-up:  BP Readings from Last 3 Encounters:  04/23/19 130/64  03/05/19 130/62  02/12/19 (!) 129/58    He was last seen for hypertension 5 months ago.  BP at that visit was 125/73. Management since that visit includes continue benazepril and amlodipine. He reports {excellent/good/fair/poor:19665} compliance with treatment. He {ACTION; IS/IS [id number] having side effects. *** He {is/is not:9024} exercising. He {is/is not:9024} adherent to low salt diet.   Outside blood pressures are ***. He is experiencing {Symptoms; cardiac:12860}.  Patient denies {Symptoms; cardiac:12860}.   Cardiovascular risk factors include {cv risk factors:510}.  Use of agents associated with hypertension: {bp agents assoc with hypertension:511::"none"}.     Weight trend: {trend:16658} Wt Readings from Last 3 Encounters:  04/23/19 220 lb 9.6 oz (100.1 kg)  03/05/19 211 lb (95.7 kg)  02/12/19 211 lb 12.8 oz (96.1 kg)    Current diet: {diet habits:16563}  ------------------------------------------------------------------------   No Known Allergies   Current Outpatient Medications:  .  allopurinol (ZYLOPRIM) 300 MG tablet, Take 1 tablet (300 mg total) by mouth at bedtime., Disp: 90 tablet, Rfl: 3 .  amLODipine (NORVASC) 5 MG tablet, Take 1 tablet by mouth once daily, Disp: 90 tablet, Rfl: 3 .  aspirin 81 MG chewable tablet, Chew 81 mg by mouth at bedtime. , Disp: , Rfl:  .  atorvastatin (LIPITOR) 40 MG tablet, Take 1 tablet (40 mg total) by mouth daily., Disp: 90 tablet, Rfl: 3 .  benazepril (LOTENSIN) 20 MG tablet, Take 1 tablet (20 mg total) by mouth at bedtime., Disp: 90 tablet, Rfl: 0 .  fluticasone (FLOVENT HFA) 220 MCG/ACT inhaler, Take 2 puffs and swallow  twice daily for 8 weeks. **RINSE MOUTH AFTER EACH USE** **DONT EAT 30 MINS AFTER** (Patient not taking: Reported on 04/23/2019), Disp: 1 Inhaler, Rfl: 6 .  nitroGLYCERIN (NITROSTAT) 0.4 MG SL tablet, Place 0.4 mg under the tongue every 5 (five) minutes as needed for chest pain. , Disp: , Rfl:  .  omeprazole (PRILOSEC) 40 MG capsule, Take 1 capsule (40 mg total) by mouth 2 (two) times daily., Disp: 60 capsule, Rfl: 2  Review of Systems  Social History   Tobacco Use  . Smoking status: Never Smoker  . Smokeless tobacco: Never Used  Substance Use Topics  . Alcohol use: No      Objective:   There were no vitals taken for this visit. There were no vitals filed for this visit.There is no height or weight on file to calculate BMI.   Physical Exam   No results found for any visits on 04/23/19.     Assessment & Plan        Lavon Paganini, MD  [facility]

## 2019-04-24 ENCOUNTER — Telehealth: Payer: Self-pay

## 2019-04-24 LAB — BASIC METABOLIC PANEL
BUN/Creatinine Ratio: 13 (ref 10–24)
BUN: 17 mg/dL (ref 8–27)
CO2: 23 mmol/L (ref 20–29)
Calcium: 9.1 mg/dL (ref 8.6–10.2)
Chloride: 104 mmol/L (ref 96–106)
Creatinine, Ser: 1.32 mg/dL — ABNORMAL HIGH (ref 0.76–1.27)
GFR calc Af Amer: 64 mL/min/{1.73_m2} (ref >59)
GFR calc non Af Amer: 55 mL/min/{1.73_m2} — ABNORMAL LOW (ref >59)
Glucose: 110 mg/dL — ABNORMAL HIGH (ref 65–99)
Potassium: 4.5 mmol/L (ref 3.5–5.2)
Sodium: 141 mmol/L (ref 134–144)

## 2019-04-24 LAB — URIC ACID: Uric Acid: 4.2 mg/dL (ref 3.8–8.4)

## 2019-04-24 NOTE — Telephone Encounter (Signed)
-----   Message from Kevin Crews, MD sent at 04/24/2019  1:16 PM EST ----- Kidney function and the liverKidney function and electrolytes stable.  Uric acid level is well controlled

## 2019-04-24 NOTE — Telephone Encounter (Signed)
Patient advised as below.  

## 2019-04-25 ENCOUNTER — Other Ambulatory Visit: Payer: Self-pay | Admitting: Gastroenterology

## 2019-04-25 DIAGNOSIS — K200 Eosinophilic esophagitis: Secondary | ICD-10-CM

## 2019-05-09 ENCOUNTER — Other Ambulatory Visit: Payer: Self-pay | Admitting: Family Medicine

## 2019-05-09 NOTE — Telephone Encounter (Signed)
Requested medication (s) are due for refill today: Yes  Requested medication (s) are on the active medication list: Yes  Last refill:  05/02/18  Future visit scheduled: Yes  Notes to clinic:  Prescription has expired.    Requested Prescriptions  Pending Prescriptions Disp Refills   allopurinol (ZYLOPRIM) 300 MG tablet 90 tablet 3    Sig: Take 1 tablet (300 mg total) by mouth at bedtime.      Endocrinology:  Gout Agents Failed - 05/09/2019  2:58 PM      Failed - Cr in normal range and within 360 days    Creat  Date Value Ref Range Status  12/13/2016 1.36 (H) 0.70 - 1.25 mg/dL Final    Comment:    For patients >41 years of age, the reference limit for Creatinine is approximately 13% higher for people identified as African-American. .    Creatinine, Ser  Date Value Ref Range Status  04/23/2019 1.32 (H) 0.76 - 1.27 mg/dL Final          Passed - Uric Acid in normal range and within 360 days    Uric Acid  Date Value Ref Range Status  04/23/2019 4.2 3.8 - 8.4 mg/dL Final    Comment:               Therapeutic target for gout patients: <6.0          Passed - Valid encounter within last 12 months    Recent Outpatient Visits           2 weeks ago Essential hypertension with goal blood pressure less than 130/80   [facility] [place], Dionne Bucy, MD   4 months ago Pain in lateral portion of left knee   [facility], Dionne Bucy, MD   6 months ago Annual physical exam   [facility], Dionne Bucy, MD   9 months ago Candidal intertrigo   [facility], Dionne Bucy, MD   1 year ago Essential hypertension with goal blood pressure less than 130/80   [facility], Dionne Bucy, MD       Future Appointments             In 1 month Vanga, Tally Due, MD [place]   In 3 months End, Harrell Gave, MD [facility] [place]

## 2019-05-09 NOTE — Telephone Encounter (Signed)
Medication Refill - Medication: allopurinol  Has the patient contacted their pharmacy? Yes.   Pt states that he moved recently and is needing this medication sent to the new pharmacy. Please advise.  (Agent: If no, request that the patient contact the pharmacy for the refill.) (Agent: If yes, when and what did the pharmacy advise?)  Preferred Pharmacy (with phone number or street name):  [id number] [name]  [address] [place] [address]  Phone: [phone] Fax: [phone]  Not a 24 hour pharmacy; exact hours not known.     Agent: Please be advised that RX refills may take up to 3 business days. We ask that you follow-up with your pharmacy.

## 2019-05-10 MED ORDER — ALLOPURINOL 300 MG PO TABS: 300.0000 mg | ORAL_TABLET | Freq: Every day | ORAL | 3 refills | Status: DC

## 2019-05-31 ENCOUNTER — Other Ambulatory Visit: Payer: Self-pay | Admitting: Gastroenterology

## 2019-05-31 DIAGNOSIS — K200 Eosinophilic esophagitis: Secondary | ICD-10-CM

## 2019-06-25 ENCOUNTER — Other Ambulatory Visit: Payer: Self-pay

## 2019-06-25 ENCOUNTER — Encounter: Payer: Self-pay | Admitting: Gastroenterology

## 2019-06-25 ENCOUNTER — Ambulatory Visit: Payer: PPO | Admitting: Gastroenterology

## 2019-06-25 VITALS — BP 167/79 | HR 59 | Temp 98.0°F | Ht 69.0 in | Wt 218.5 lb

## 2019-06-25 DIAGNOSIS — K200 Eosinophilic esophagitis: Secondary | ICD-10-CM

## 2019-06-25 DIAGNOSIS — K2100 Gastro-esophageal reflux disease with esophagitis, without bleeding: Secondary | ICD-10-CM

## 2019-06-25 MED ORDER — FLUTICASONE PROPIONATE HFA 220 MCG/ACT IN AERO: INHALATION_SPRAY | RESPIRATORY_TRACT | 2 refills | Status: DC

## 2019-06-25 NOTE — Progress Notes (Signed)
[name], MD [address]  [place]  [place], [place] [address]  Main: [phone]  Fax: [phone]    Gastroenterology Consultation  Referring Provider:     Virginia Crews, MD Primary Care Physician:  Virginia Crews, MD Primary Gastroenterologist:  Dr. [name] Reason for Consultation:     Chronic dysphagia        HPI:   Kevin Melton is a 69 y.o. male referred by Dr. Brita Romp, Dionne Bucy, MD  for consultation & management of chronic dysphagia to solids. He experienced food impaction in 2016, went to ER, food bolus spontaneously passed after glucagon and soda.  Another epsidoe 85months later, went to ER again Did not have EGD during both episodes. Since then, he stayed away from hard foods due to dysphagia He is very careful of what he is eating, did not see GI until now due to lack of insurance Currently, having nocturnal heart burn if he has late dinner Currently not on PPI or H2 blocker He has mildly elevated serum creatinine, was thought to be secondary to ACE inhibitor and also, he had history of kidney stones. We do not have his baseline creatinine He does not smoke, or drink alcohol, denies NSAID use He is a Geophysicist/field seismologist by profession He denies any family history of GI malignancies  Follow-up visit 01/31/2017: Since his initial visit, he underwent EGD which revealed eosinophilic esophagitis and confirmed on biopsies. I started him on omeprazole 40 mg twice a day and he manages to take it 2 times daily most of the days. He tends to forget to take before breakfast. Today, he is accompanied by his wife and feels asymptomatic. He no longer has heartburn, regurgitation, dysphagia. On recent labs he is found to have elevated eosinophils in 600s  Follow-up visit 02/12/2019 Patient has not seen me for 2 years.  He has eosinophilic esophagitis for which he was maintained on omeprazole 40 mg twice daily.  I recommended follow-up EGD to assess  response to proton pump inhibitor which he did not.  He completed the prescription for omeprazole and did not contact my office since then.  He said he was asymptomatic for 1 year since last visit.  Within last 1 year, he started having episodes of food impactions, none of which he had to go to ER and spontaneously passed after receiving glucagon.  He also had intermittent episodes of food impactions at home. Since then he has been carefully eating and chewing well.  His weight has been stable.  Follow-up visit 06/25/2019 Patient reports doing well from dysphagia standpoint.  He has been taking omeprazole 40 mg twice daily.  He denies any acid reflux.  He took swallowed fluticasone for 2 months.  He is also gaining weight.  He is seen by cardiologist as well, no further work-up at this time  GI Procedures: had a colonoscopy at age 40 and at age 60, reportedly normal EGD 01/05/2017 The duodenal bulb and second portion of the duodenum were normal. The entire examined stomach was normal. The cardia and gastric fundus were normal on retroflexion.  Mucosal changes including ringed esophagus, feline appearance, longitudinal furrows and congestion (edema) were found in the entire esophagus. Esophageal findings were graded using the Eosinophilic Esophagitis Endoscopic Reference Score (EoE-EREFS) as: Edema Grade 1 Present (decreased clarity or absence of vascular markings), Rings Grade 2 Moderate (distinct rings that do not occlude passage of diagnostic 8-10 mm endoscope), Exudates Grade 0 None (no white lesions  seen), Furrows Grade 1 Present (vertical lines with or without visible depth) and Stricture none (no stricture found). Biopsies were obtained from the proximal and distal esophagus with cold forceps for histology of suspected eosinophilic esophagitis. LA Grade B (one or more mucosal breaks greater than 5 mm, not extending between the tops of two mucosal folds) esophagitis with no bleeding was found 39 cm  from the incisors.  Pathology: DIAGNOSIS:  A. DISTAL ESOPHAGUS; COLD BIOPSY:  - INCREASED INTRAEPITHELIAL EOSINOPHILS, UP TO 22 IN A HIGH-POWER FIELD,  SEE COMMENT.  - NEGATIVE FOR DYSPLASIA AND MALIGNANCY.   B. PROXIMAL ESOPHAGUS; COLD BIOPSY:  - INCREASED INTRAEPITHELIAL EOSINOPHILS, UP TO 23 IN A HIGH-POWER FIELD,  SEE COMMENT.  - NEGATIVE FOR DYSPLASIA AND MALIGNANCY.   Comment:  Sections show diffusely increased eosinophils with degranulation and  focal eosinophilic abscess formation. There is basal cell hyperplasia  and spongiosis. Changes in the proximal esophagus are slightly more  prominent than distal. These findings are nonspecific and may be seen in  GERD, eosinophilic esophagitis, systemic eosinophilic diseases and other  disorders. Combined features of GERD and eosinophilic esophagitis are  sometimes seen. Correlation with endoscopic findings is helpful to  distinguish between these diseases.   Past Medical History:  Diagnosis Date  . Abdominal hernia    umbilical  . Chronic kidney disease (CKD), active medical management without dialysis   . Chronic kidney disease, stage III (moderate)   . Coronary artery disease   . GERD (gastroesophageal reflux disease)   . History of kidney stones    h/o  . Kidney stones   . MI (myocardial infarction) [facility]) 2013   1 stent placed-sees Dr Estrella Myrtle) last seen in nov 2018    Past Surgical History:  Procedure Laterality Date  . COLONOSCOPY  2013  . CORONARY ANGIOPLASTY WITH STENT PLACEMENT  2013   [place]  . ESOPHAGOGASTRODUODENOSCOPY (EGD) WITH PROPOFOL N/A 01/05/2017   Procedure: ESOPHAGOGASTRODUODENOSCOPY (EGD) WITH PROPOFOL With Dilation;  Surgeon: Lin Landsman, MD;  Location: [facility] ENDOSCOPY;  Service: Gastroenterology;  Laterality: N/A;  . LITHOTRIPSY  2014  . UMBILICAL HERNIA REPAIR N/A 02/20/2017   Primary repair 2 cm umbilical hernia;  Surgeon: Robert Bellow, MD;  Location: [facility] ORS;   Service: General;  Laterality: N/A;     Current Outpatient Medications:  .  allopurinol (ZYLOPRIM) 300 MG tablet, Take 1 tablet (300 mg total) by mouth at bedtime., Disp: 90 tablet, Rfl: 3 .  amLODipine (NORVASC) 5 MG tablet, Take 1 tablet by mouth once daily, Disp: 90 tablet, Rfl: 3 .  aspirin 81 MG chewable tablet, Chew 81 mg by mouth at bedtime. , Disp: , Rfl:  .  atorvastatin (LIPITOR) 40 MG tablet, Take 1 tablet (40 mg total) by mouth daily., Disp: 90 tablet, Rfl: 3 .  benazepril (LOTENSIN) 20 MG tablet, Take 1 tablet (20 mg total) by mouth at bedtime., Disp: 90 tablet, Rfl: 0 .  nitroGLYCERIN (NITROSTAT) 0.4 MG SL tablet, Place 0.4 mg under the tongue every 5 (five) minutes as needed for chest pain. , Disp: , Rfl:  .  omeprazole (PRILOSEC) 40 MG capsule, TAKE 1 CAPSULE BY MOUTH TWICE A DAY, Disp: 60 capsule, Rfl: 1 .  triamcinolone ointment (KENALOG) 0.5 %, Apply 1 application topically 2 (two) times daily., Disp: 30 g, Rfl: 3 .  fluticasone (FLOVENT HFA) 220 MCG/ACT inhaler, Take 2 puffs and swallow once daily. **RINSE MOUTH AFTER EACH USE** **DON'T EAT or drink 30 MINS AFTER**, Disp: 1 Inhaler, Rfl:  2   Family History  Problem Relation Age of Onset  . Dementia Mother 24  . Lung disease Father 37       collapsed lung  . Healthy Sister   . Healthy Brother   . Heart Problems Brother   . Healthy Son   . Healthy Sister   . Colon cancer Neg Hx   . Prostate cancer Neg Hx   . Heart disease Neg Hx      Social History   Tobacco Use  . Smoking status: Never Smoker  . Smokeless tobacco: Never Used  Substance Use Topics  . Alcohol use: No  . Drug use: No    Allergies as of 06/25/2019  . (No Known Allergies)    Review of Systems:    All systems reviewed and negative except where noted in HPI.   Physical Exam:  BP (!) 167/79 (BP Location: Left Arm, Patient Position: Sitting, Cuff Size: Normal)   Pulse (!) 59   Temp 98 F (36.7 C) (Oral)   Ht 5\' 9"  (1.753 m)   Wt 218 lb  8 oz (99.1 kg)   BMI 32.27 kg/m  No LMP for male patient.  General:   Alert,  Well-developed, well-nourished, pleasant and cooperative in NAD Head:  Normocephalic and atraumatic. Eyes:  Sclera clear, no icterus.   Conjunctiva pink. Ears:  Normal auditory acuity. Nose:  No deformity, discharge, or lesions. Mouth:  No deformity or lesions,oropharynx pink & moist. Neck:  Supple; no masses or thyromegaly. Lungs:  Respirations even and unlabored.  Clear throughout to auscultation.   No wheezes, crackles, or rhonchi. No acute distress. Heart:  Regular rate and rhythm; no murmurs, clicks, rubs, or gallops. Abdomen:  Normal bowel sounds. Soft, obese,non-tender and non-distended, No hepatosplenomegaly noted.  No guarding or rebound tenderness.   Rectal: Nor performed Msk:  Symmetrical without gross deformities. Good, equal movement & strength bilaterally. Pulses:  Normal pulses noted. Extremities:  No clubbing or edema.  No cyanosis. Neurologic:  Alert and oriented x3;  grossly normal neurologically. Skin:  Intact without significant lesions or rashes. No jaundice. Psych:  Alert and cooperative. Normal mood and affect.  Imaging Studies: none  Assessment and Plan:   Yug Jha Rion is a 69 y.o. male with eosinophilic esophagitis, currently in clinical remission  1. Chronic dysphagia: Due to eosinophilic esophagitis, biopsy proven EREFS score 3, now with recurrence of dysphagia -Continue omeprazole 40mg  BID  -Restart swallowed fluticasone 220 MCG, once daily - Recommend repeat EGD with biopsies to assess response to above therapy - Recheck Cr today while on PPI. His Cr is stable currently and at baseline  2. Peripheral blood eosinophilia -Normal CBC with differential  3. Colonoscopy at age 24  Follow up in 4 to 6 months   [name], MD

## 2019-06-26 LAB — CREATININE, SERUM
Creatinine, Ser: 1.31 mg/dL — ABNORMAL HIGH (ref 0.76–1.27)
GFR calc Af Amer: 65 mL/min/{1.73_m2} (ref >59)
GFR calc non Af Amer: 56 mL/min/{1.73_m2} — ABNORMAL LOW (ref >59)

## 2019-07-01 ENCOUNTER — Other Ambulatory Visit
Admission: RE | Admit: 2019-07-01 | Discharge: 2019-07-01 | Disposition: A | Discharge status: Home / Self Care | Payer: PPO | Source: Ambulatory Visit | Attending: Gastroenterology | Admitting: Gastroenterology

## 2019-07-01 ENCOUNTER — Other Ambulatory Visit: Payer: Self-pay

## 2019-07-01 DIAGNOSIS — Z01812 Encounter for preprocedural laboratory examination: Secondary | ICD-10-CM | POA: Diagnosis not present

## 2019-07-01 DIAGNOSIS — Z20822 Contact with and (suspected) exposure to covid-19: Secondary | ICD-10-CM | POA: Diagnosis not present

## 2019-07-01 LAB — SARS CORONAVIRUS 2 (TAT 6-24 HRS): SARS Coronavirus 2: NEGATIVE

## 2019-07-03 ENCOUNTER — Ambulatory Visit: Payer: PPO | Admitting: Registered Nurse

## 2019-07-03 ENCOUNTER — Ambulatory Visit
Admission: RE | Admit: 2019-07-03 | Discharge: 2019-07-03 | Disposition: A | Discharge status: Home / Self Care | Payer: PPO | Attending: Gastroenterology | Admitting: Gastroenterology

## 2019-07-03 ENCOUNTER — Encounter: Payer: Self-pay | Admitting: Gastroenterology

## 2019-07-03 ENCOUNTER — Encounter
Admission: RE | Disposition: A | Discharge status: Home / Self Care | Payer: Self-pay | Source: Home / Self Care | Attending: Gastroenterology

## 2019-07-03 ENCOUNTER — Other Ambulatory Visit: Payer: Self-pay

## 2019-07-03 DIAGNOSIS — K200 Eosinophilic esophagitis: Secondary | ICD-10-CM | POA: Diagnosis not present

## 2019-07-03 DIAGNOSIS — Z7982 Long term (current) use of aspirin: Secondary | ICD-10-CM | POA: Insufficient documentation

## 2019-07-03 DIAGNOSIS — K219 Gastro-esophageal reflux disease without esophagitis: Secondary | ICD-10-CM | POA: Diagnosis not present

## 2019-07-03 DIAGNOSIS — Z955 Presence of coronary angioplasty implant and graft: Secondary | ICD-10-CM | POA: Insufficient documentation

## 2019-07-03 DIAGNOSIS — N183 Chronic kidney disease, stage 3 unspecified: Secondary | ICD-10-CM | POA: Diagnosis not present

## 2019-07-03 DIAGNOSIS — I252 Old myocardial infarction: Secondary | ICD-10-CM | POA: Diagnosis not present

## 2019-07-03 DIAGNOSIS — I2510 Atherosclerotic heart disease of native coronary artery without angina pectoris: Secondary | ICD-10-CM | POA: Insufficient documentation

## 2019-07-03 DIAGNOSIS — I129 Hypertensive chronic kidney disease with stage 1 through stage 4 chronic kidney disease, or unspecified chronic kidney disease: Secondary | ICD-10-CM | POA: Diagnosis not present

## 2019-07-03 DIAGNOSIS — Z79899 Other long term (current) drug therapy: Secondary | ICD-10-CM | POA: Insufficient documentation

## 2019-07-03 DIAGNOSIS — R1314 Dysphagia, pharyngoesophageal phase: Secondary | ICD-10-CM | POA: Diagnosis not present

## 2019-07-03 DIAGNOSIS — Z7951 Long term (current) use of inhaled steroids: Secondary | ICD-10-CM | POA: Insufficient documentation

## 2019-07-03 HISTORY — PX: ESOPHAGOGASTRODUODENOSCOPY (EGD) WITH PROPOFOL: SHX5813

## 2019-07-03 SURGERY — ESOPHAGOGASTRODUODENOSCOPY (EGD) WITH PROPOFOL
Anesthesia: General

## 2019-07-03 MED ORDER — GLYCOPYRROLATE 0.2 MG/ML IJ SOLN
INTRAMUSCULAR | Status: DC | PRN
  Administered 2019-07-03: .2 mg via INTRAVENOUS

## 2019-07-03 MED ORDER — PROPOFOL 10 MG/ML IV BOLUS
INTRAVENOUS | Status: DC | PRN
  Administered 2019-07-03: 80 mg via INTRAVENOUS

## 2019-07-03 MED ORDER — SODIUM CHLORIDE 0.9 % IV SOLN: INTRAVENOUS | Status: DC

## 2019-07-03 MED ORDER — PROPOFOL 500 MG/50ML IV EMUL
INTRAVENOUS | Status: DC | PRN
  Administered 2019-07-03: 140 ug/kg/min via INTRAVENOUS

## 2019-07-03 MED ORDER — LIDOCAINE HCL (CARDIAC) PF 100 MG/5ML IV SOSY
PREFILLED_SYRINGE | INTRAVENOUS | Status: DC | PRN
  Administered 2019-07-03: 100 mg via INTRAVENOUS

## 2019-07-03 NOTE — H&P (Signed)
Kevin Darby, Melton [address]  [place]  [place], [place] [address]  Main: [phone]  Fax: [phone] Pager: [phone]  Primary Care Physician:  Kevin Crews, Melton Primary Gastroenterologist:  Kevin. Cephas Melton  Pre-Procedure History & Physical: HPI:  Kevin Melton is a 69 y.o. male is here for an endoscopy.   Past Medical History:  Diagnosis Date  . Abdominal hernia    umbilical  . Chronic kidney disease (CKD), active medical management without dialysis   . Chronic kidney disease, stage III (moderate)   . Coronary artery disease   . GERD (gastroesophageal reflux disease)   . History of kidney stones    h/o  . Kidney stones   . MI (myocardial infarction) [facility]) 2013   1 stent placed-sees Kevin Melton) last seen in nov 2018    Past Surgical History:  Procedure Laterality Date  . COLONOSCOPY  2013  . CORONARY ANGIOPLASTY WITH STENT PLACEMENT  2013   [place]  . ESOPHAGOGASTRODUODENOSCOPY (EGD) WITH PROPOFOL N/A 01/05/2017   Procedure: ESOPHAGOGASTRODUODENOSCOPY (EGD) WITH PROPOFOL With Dilation;  Surgeon: Kevin Landsman, Melton;  Location: [facility] ENDOSCOPY;  Service: Gastroenterology;  Laterality: N/A;  . HERNIA REPAIR    . LITHOTRIPSY  2014  . UMBILICAL HERNIA REPAIR N/A 02/20/2017   Primary repair 2 cm umbilical hernia;  Surgeon: Kevin Bellow, Melton;  Location: [facility] ORS;  Service: General;  Laterality: N/A;    Prior to Admission medications   Medication Sig Start Date End Date Taking? Authorizing Provider  allopurinol (ZYLOPRIM) 300 MG tablet Take 1 tablet (300 mg total) by mouth at bedtime. 05/10/19  Yes Melton, Kevin Bucy, Melton  amLODipine (NORVASC) 5 MG tablet Take 1 tablet by mouth once daily 01/17/19  Yes Melton, Kevin Bucy, Melton  aspirin 81 MG chewable tablet Chew 81 mg by mouth at bedtime.    Yes Kevin Melton  benazepril (LOTENSIN) 20 MG tablet Take 1 tablet (20 mg total) by mouth at bedtime. 04/16/19  Yes  Melton, Kevin Bucy, Melton  fluticasone (FLOVENT HFA) 220 MCG/ACT inhaler Take 2 puffs and swallow once daily. **RINSE MOUTH AFTER EACH USE** **DON'T EAT or drink 30 MINS AFTER** 06/25/19  Yes Kevin Melton, Kevin Due, Melton  omeprazole (PRILOSEC) 40 MG capsule TAKE 1 CAPSULE BY MOUTH TWICE A DAY 06/03/19  Yes Kevin Melton, Kevin Due, Melton  triamcinolone ointment (KENALOG) 0.5 % Apply 1 application topically 2 (two) times daily. 04/23/19  Yes Melton, Kevin Bucy, Melton  atorvastatin (LIPITOR) 40 MG tablet Take 1 tablet (40 mg total) by mouth daily. 05/02/18 06/25/19  Kevin Crews, Melton  nitroGLYCERIN (NITROSTAT) 0.4 MG SL tablet Place 0.4 mg under the tongue every 5 (five) minutes as needed for chest pain.  03/11/15   Kevin Melton    Allergies as of 06/25/2019  . (No Known Allergies)    Family History  Problem Relation Age of Onset  . Dementia Mother 69  . Lung disease Father 30       collapsed lung  . Healthy Sister   . Healthy Brother   . Heart Problems Brother   . Healthy Son   . Healthy Sister   . Colon cancer Neg Hx   . Prostate cancer Neg Hx   . Heart disease Neg Hx     Social History   Socioeconomic History  . Marital status: Married    Spouse name: Kevin Melton  . Number of children: 1  . Years of education: 24  . Highest  education level: High school graduate  Occupational History  . Occupation: Self Employed    Comment: [employer]: semi-retired  Tobacco Use  . Smoking status: Never Smoker  . Smokeless tobacco: Never Used  Substance and Sexual Activity  . Alcohol use: No  . Drug use: No  . Sexual activity: Yes    Partners: Female  Other Topics Concern  . Not on file  Social History Narrative  . Not on file   Social Determinants of Health   Financial Resource Strain: Low Risk   . Difficulty of Paying Living Expenses: Not hard at all  Food Insecurity: No Food Insecurity  . Worried About Charity fundraiser in the Last Year: Never true    . Ran Out of Food in the Last Year: Never true  Transportation Needs: No Transportation Needs  . Lack of Transportation (Medical): No  . Lack of Transportation (Non-Medical): No  Physical Activity: Inactive  . Days of Exercise per Week: 0 days  . Minutes of Exercise per Session: 0 min  Stress: No Stress Concern Present  . Feeling of Stress : Not at all  Social Connections: Somewhat Isolated  . Frequency of Communication with Friends and Family: More than three times a week  . Frequency of Social Gatherings with Friends and Family: Never  . Attends Religious Services: Never  . Active Member of Clubs or Organizations: No  . Attends Archivist Meetings: Never  . Marital Status: Married  Human resources officer Violence: Not At Risk  . Fear of Current or Ex-Partner: No  . Emotionally Abused: No  . Physically Abused: No  . Sexually Abused: No    Review of Systems: See HPI, otherwise negative ROS  Physical Exam: BP 133/65   Pulse (!) 50   Temp 98.3 F (36.8 C) (Temporal)   Resp 17   Ht 5\' 9"  (1.753 m)   Wt 99.1 kg   SpO2 99%   BMI 32.26 kg/m  General:   Alert,  pleasant and cooperative in NAD Head:  Normocephalic and atraumatic. Neck:  Supple; no masses or thyromegaly. Lungs:  Clear throughout to auscultation.    Heart:  Regular rate and rhythm. Abdomen:  Soft, nontender and nondistended. Normal bowel sounds, without guarding, and without rebound.   Neurologic:  Alert and  oriented x4;  grossly normal neurologically.  Impression/Plan: Kevin Melton is here for an endoscopy to be performed for EoE  Risks, benefits, limitations, and alternatives regarding  endoscopy have been reviewed with the patient.  Questions have been answered.  All parties agreeable.   Kevin Sear, Melton  07/03/2019, 9:07 AM

## 2019-07-03 NOTE — Op Note (Signed)
[facility] Gastroenterology Patient Name: Kevin Melton Procedure Date: 07/13/3265 9:15 AM MRN: [id number] Account #: [id number] Date of Birth: 1950/11/27 Admit Type: Outpatient Age: 69 Room: [facility] ENDO ROOM 4 Gender: Male Note Status: Finalized Procedure:             Upper GI endoscopy Indications:           Esophageal dysphagia, Eosinophilic esophagitis,                         Follow-up of eosinophilic esophagitis Providers:             Lin Landsman MD, MD Referring MD:          Dionne Bucy. Bacigalupo (Referring MD) Medicines:             Monitored Anesthesia Care Complications:         No immediate complications. Estimated blood loss: None. Procedure:             Pre-Anesthesia Assessment:                        - Prior to the procedure, a History and Physical was                         performed, and patient medications and allergies were                         reviewed. The patient is competent. The risks and                         benefits of the procedure and the sedation options and                         risks were discussed with the patient. All questions                         were answered and informed consent was obtained.                         Patient identification and proposed procedure were                         verified by the physician, the nurse, the                         anesthesiologist, the anesthetist and the technician                         in the pre-procedure area in the procedure room in the                         endoscopy suite. Mental Status Examination: alert and                         oriented. Airway Examination: normal oropharyngeal                         airway and neck mobility. Respiratory Examination:  clear to auscultation. CV Examination: normal.                         Prophylactic Antibiotics: The patient does not require                         prophylactic antibiotics. Prior  Anticoagulants: The                         patient has taken no previous anticoagulant or                         antiplatelet agents. ASA Grade Assessment: II - A                         patient with mild systemic disease. After reviewing                         the risks and benefits, the patient was deemed in                         satisfactory condition to undergo the procedure. The                         anesthesia plan was to use monitored anesthesia care                         (MAC). Immediately prior to administration of                         medications, the patient was re-assessed for adequacy                         to receive sedatives. The heart rate, respiratory                         rate, oxygen saturations, blood pressure, adequacy of                         pulmonary ventilation, and response to care were                         monitored throughout the procedure. The physical                         status of the patient was re-assessed after the                         procedure.                        After obtaining informed consent, the endoscope was                         passed under direct vision. Throughout the procedure,                         the patient's blood pressure, pulse, and oxygen  saturations were monitored continuously. The Endoscope                         was introduced through the mouth, and advanced to the                         second part of duodenum. The upper GI endoscopy was                         accomplished without difficulty. The patient tolerated                         the procedure well. Findings:      The duodenal bulb and second portion of the duodenum were normal.      The entire examined stomach was normal.      The cardia and gastric fundus were normal on retroflexion.      Mucosal changes including longitudinal furrows were found in the entire       esophagus. Esophageal findings were graded  using the Eosinophilic       Esophagitis Endoscopic Reference Score (EoE-EREFS) as: Edema Grade 0       Normal (distinct vascular markings), Rings Grade 1 Mild (subtle       circumferential ridges seen on esophageal distension), Exudates Grade 0       None (no white lesions seen) and Furrows Grade 1 Present (vertical lines       with or without visible depth). Biopsies were obtained from the proximal       and distal esophagus with cold forceps for histology of suspected       eosinophilic esophagitis. Impression:            - Normal duodenal bulb and second portion of the                         duodenum.                        - Normal stomach.                        - Esophageal mucosal changes secondary to eosinophilic                         esophagitis. Biopsied. Recommendation:        - Discharge patient to home (with escort).                        - Resume previous diet today.                        - Continue present medications.                        - Await pathology results.                        - Return to my office as previously scheduled. Procedure Code(s):     --- Professional ---                        [phone], Esophagogastroduodenoscopy, flexible,  transoral; with biopsy, single or multiple Diagnosis Code(s):     --- Professional ---                        J62.8, Eosinophilic esophagitis                        R13.14, Dysphagia, pharyngoesophageal phase CPT copyright 2019 American Medical Association. All rights reserved. The codes documented in this report are preliminary and upon coder review may  be revised to meet current compliance requirements. Dr. Ulyess Mort Lin Landsman MD, MD 07/03/2019 9:29:19 AM This report has been signed electronically. Number of Addenda: 0 Note Initiated On: 07/03/2019 9:15 AM Estimated Blood Loss:  Estimated blood loss: none.      [facility]

## 2019-07-03 NOTE — Transfer of Care (Signed)
Immediate Anesthesia Transfer of Care Note  Patient: Kevin Melton  Procedure(s) Performed: ESOPHAGOGASTRODUODENOSCOPY (EGD) WITH PROPOFOL (N/A )  Patient Location: PACU  Anesthesia Type:General  Level of Consciousness: sedated  Airway & Oxygen Therapy: Patient Spontanous Breathing  Post-op Assessment: Report given to RN and Post -op Vital signs reviewed and stable  Post vital signs: Reviewed and stable  Last Vitals:  Vitals Value Taken Time  BP    Temp    Pulse    Resp    SpO2      Last Pain:  Vitals:   07/03/19 0831  TempSrc: Temporal  PainSc: 0-No pain         Complications: No apparent anesthesia complications

## 2019-07-03 NOTE — Anesthesia Postprocedure Evaluation (Signed)
Anesthesia Post Note  Patient: Kevin Melton  Procedure(s) Performed: ESOPHAGOGASTRODUODENOSCOPY (EGD) WITH PROPOFOL (N/A )  Patient location during evaluation: Endoscopy Anesthesia Type: General Level of consciousness: awake and alert Pain management: pain level controlled Vital Signs Assessment: post-procedure vital signs reviewed and stable Respiratory status: spontaneous breathing, nonlabored ventilation, respiratory function stable and patient connected to nasal cannula oxygen Cardiovascular status: blood pressure returned to baseline and stable Postop Assessment: no apparent nausea or vomiting Anesthetic complications: no     Last Vitals:  Vitals:   07/03/19 0938 07/03/19 0948  BP: 110/78 127/76  Pulse: 63 (!) 57  Resp: 15 14  Temp: (!) 36.4 C   SpO2: 98% 95%    Last Pain:  Vitals:   07/03/19 0948  TempSrc:   PainSc: 0-No pain                 Martha Clan

## 2019-07-03 NOTE — Anesthesia Preprocedure Evaluation (Signed)
Anesthesia Evaluation  Patient identified by MRN, date of birth, ID band Patient awake    Reviewed: Allergy & Precautions, H&P , NPO status , Patient's Chart, lab work & pertinent test results, reviewed documented beta blocker date and time   History of Anesthesia Complications Negative for: history of anesthetic complications  Airway Mallampati: II  TM Distance: >3 FB Neck ROM: full    Dental  (+) Poor Dentition, Dental Advidsory Given, Missing   Pulmonary neg pulmonary ROS,           Cardiovascular Exercise Tolerance: Good hypertension, (-) angina+ CAD, + Past MI and + Cardiac Stents  (-) CABG (-) dysrhythmias (-) Valvular Problems/Murmurs     Neuro/Psych negative neurological ROS  negative psych ROS   GI/Hepatic Neg liver ROS, GERD  ,  Endo/Other  negative endocrine ROS  Renal/GU CRFRenal disease (kidney stones)  negative genitourinary   Musculoskeletal   Abdominal   Peds  Hematology negative hematology ROS (+)   Anesthesia Other Findings Past Medical History: No date: Abdominal hernia     Comment:  umbilical No date: Chronic kidney disease (CKD), active medical management  without dialysis No date: GERD (gastroesophageal reflux disease) No date: Kidney stones 2013: MI (myocardial infarction) ([place])     Comment:  1 stent placed   Reproductive/Obstetrics negative OB ROS                             Anesthesia Physical  Anesthesia Plan  ASA: II  Anesthesia Plan: General   Post-op Pain Management:    Induction: Intravenous  PONV Risk Score and Plan: 2 and Propofol infusion and TIVA  Airway Management Planned: Nasal Cannula and Natural Airway  Additional Equipment:   Intra-op Plan:   Post-operative Plan:   Informed Consent: I have reviewed the patients History and Physical, chart, labs and discussed the procedure including the risks, benefits and alternatives for the  proposed anesthesia with the patient or authorized representative who has indicated his/her understanding and acceptance.     Dental Advisory Given  Plan Discussed with: Anesthesiologist, CRNA and Surgeon  Anesthesia Plan Comments:         Anesthesia Quick Evaluation

## 2019-07-04 ENCOUNTER — Encounter: Payer: Self-pay | Admitting: *Deleted

## 2019-07-04 LAB — SURGICAL PATHOLOGY

## 2019-07-14 ENCOUNTER — Other Ambulatory Visit: Payer: Self-pay | Admitting: Family Medicine

## 2019-07-14 NOTE — Telephone Encounter (Signed)
Requested medication (s) are due for refill today: yes  Requested medication (s) are on the active medication list: yes  Last refill:  05/02/18  Future visit scheduled: no  Notes to clinic:  prescription expired 06/25/19   Requested Prescriptions  Pending Prescriptions Disp Refills   atorvastatin (LIPITOR) 40 MG tablet [Pharmacy Med Name: ATORVASTATIN 40 MG TABLET] 90 tablet 1    Sig: TAKE ONE TABLET BY MOUTH ONCE DAILY      Cardiovascular:  Antilipid - Statins Failed - 07/14/2019 10:46 AM      Failed - HDL in normal range and within 360 days    HDL  Date Value Ref Range Status  10/26/2018 33 (L) >39 mg/dL Final          Passed - Total Cholesterol in normal range and within 360 days    Cholesterol, Total  Date Value Ref Range Status  10/26/2018 116 100 - 199 mg/dL Final          Passed - LDL in normal range and within 360 days    LDL Cholesterol (Calc)  Date Value Ref Range Status  11/10/2016 90 mg/dL (calc) Final    Comment:    Reference range: <100 . Desirable range <100 mg/dL for primary prevention;   <70 mg/dL for patients with CHD or diabetic patients  with > or = 2 CHD risk factors. Marland Kitchen LDL-C is now calculated using the Martin-Hopkins  calculation, which is a validated novel method providing  better accuracy than the Friedewald equation in the  estimation of LDL-C.  Cresenciano Genre et al. Annamaria Helling. [id number]): 2061-2068  (http://education.[employer].com/faq/FAQ164)    LDL Calculated  Date Value Ref Range Status  10/26/2018 57 0 - 99 mg/dL Final          Passed - Triglycerides in normal range and within 360 days    Triglycerides  Date Value Ref Range Status  10/26/2018 130 0 - 149 mg/dL Final          Passed - Patient is not pregnant      Passed - Valid encounter within last 12 months    Recent Outpatient Visits           2 months ago Essential hypertension with goal blood pressure less than 130/80   [facility], Dionne Bucy,  MD   6 months ago Pain in lateral portion of left knee   [facility], Dionne Bucy, MD   8 months ago Annual physical exam   [facility], Dionne Bucy, MD   11 months ago Candidal intertrigo   [facility] [place], Dionne Bucy, MD   1 year ago Essential hypertension with goal blood pressure less than 130/80   [facility], Dionne Bucy, MD       Future Appointments             In 1 month End, Harrell Gave, MD [facility] [place]   In 3 months [place], Tally Due, MD [place]

## 2019-07-18 ENCOUNTER — Other Ambulatory Visit: Payer: Self-pay | Admitting: Family Medicine

## 2019-07-18 MED ORDER — AMLODIPINE BESYLATE 5 MG PO TABS: 5.0000 mg | ORAL_TABLET | Freq: Every day | ORAL | 1 refills | Status: DC

## 2019-07-18 MED ORDER — BENAZEPRIL HCL 20 MG PO TABS: 20.0000 mg | ORAL_TABLET | Freq: Every day | ORAL | 1 refills | Status: DC

## 2019-07-18 NOTE — Telephone Encounter (Signed)
Requested Prescriptions  Pending Prescriptions Disp Refills  . amLODipine (NORVASC) 5 MG tablet 90 tablet 1    Sig: Take 1 tablet (5 mg total) by mouth daily.     Cardiovascular:  Calcium Channel Blockers Passed - 07/18/2019  4:51 PM      Passed - Last BP in normal range    BP Readings from Last 1 Encounters:  07/03/19 127/76         Passed - Valid encounter within last 6 months    Recent Outpatient Visits          2 months ago Essential hypertension with goal blood pressure less than 130/80   [facility], Dionne Bucy, MD   6 months ago Pain in lateral portion of left knee   [facility], Dionne Bucy, MD   8 months ago Annual physical exam   [facility], Dionne Bucy, MD   11 months ago Candidal intertrigo   [facility], Dionne Bucy, MD   1 year ago Essential hypertension with goal blood pressure less than 130/80   [facility], Dionne Bucy, MD      Future Appointments            In 1 month End, Harrell Gave, MD [facility]   In 3 months Vanga, Tally Due, MD [place]           . benazepril (LOTENSIN) 20 MG tablet 90 tablet 1    Sig: Take 1 tablet (20 mg total) by mouth at bedtime.     Cardiovascular:  ACE Inhibitors Failed - 07/18/2019  4:51 PM      Failed - Cr in normal range and within 180 days    Creat  Date Value Ref Range Status  12/13/2016 1.36 (H) 0.70 - 1.25 mg/dL Final    Comment:    For patients >67 years of age, the reference limit for Creatinine is approximately 13% higher for people identified as African-American. .    Creatinine, Ser  Date Value Ref Range Status  06/25/2019 1.31 (H) 0.76 - 1.27 mg/dL Final         Passed - K in normal range and within 180 days    Potassium  Date Value Ref Range Status  04/23/2019 4.5 3.5 - 5.2 mmol/L Final         Passed - Patient is not pregnant     Passed - Last BP in normal range    BP Readings from Last 1 Encounters:  07/03/19 127/76         Passed - Valid encounter within last 6 months    Recent Outpatient Visits          2 months ago Essential hypertension with goal blood pressure less than 130/80   [facility], Dionne Bucy, MD   6 months ago Pain in lateral portion of left knee   [facility], Dionne Bucy, MD   8 months ago Annual physical exam   [facility], Dionne Bucy, MD   11 months ago Candidal intertrigo   [facility], Dionne Bucy, MD   1 year ago Essential hypertension with goal blood pressure less than 130/80   [place] [place], MD      Future Appointments            In 1 month End, Harrell Gave, MD [facility]   In  3 months Vanga, Tally Due, MD [place]

## 2019-07-18 NOTE — Telephone Encounter (Signed)
Medication Refill - Medication:  amlodipine benazepril   Has the patient contacted their pharmacy? Yes.   (Agent: If no, request that the patient contact the pharmacy for the refill.) (Agent: If yes, when and what did the pharmacy advise?)  Preferred Pharmacy (with phone number or street name):  [id number] [name] [address] Phone:  [phone]  Fax:  [phone]       Agent: Please be advised that RX refills may take up to 3 business days. We ask that you follow-up with your pharmacy.

## 2019-08-26 ENCOUNTER — Ambulatory Visit: Payer: PPO | Admitting: Internal Medicine

## 2019-08-26 NOTE — Progress Notes (Deleted)
Follow-up Outpatient Visit Date: 08/26/2019  Primary Care Provider: Virginia Crews, MD [address] [place] [address]  Chief Complaint: ***  HPI:  Mr. Lachney is a 69 y.o. male with history of coronary artery disease with inferior MI status post BMS to RCA in 2013, hypertension, hyperlipidemia, chronic kidney disease stage III, and gout, who presents for follow-up of coronary artery disease.  He was last seen in our office in 02/2019 by Laurann Montana, NP.  He was doing well at that time.  Only notable finding was new RBBB on EKG.  --------------------------------------------------------------------------------------------------  Cardiovascular History & Procedures: Cardiovascular Problems:  Coronary artery disease status post PCI to RCA in the setting of inferior MI (2013)  Risk Factors:  Known coronary artery disease, hypertension, male gender, and age greater than 94  Cath/PCI:  LHC/PCI (2013, [place]): Details unknown.  Per report, bare-metal stent placed to RCA.  CV Surgery:  None  EP Procedures and Devices:  None  Non-Invasive Evaluation(s):  Pharmacologic MPI (11/05/13): Small reversible apical anteroseptal defect suggestive of ischemia.  Normal wall motion with LVEF of 60%.  Recent CV Pertinent Labs: Lab Results  Component Value Date   CHOL 116 10/26/2018   HDL 33 (L) 10/26/2018   LDLCALC 57 10/26/2018   LDLCALC 90 11/10/2016   TRIG 130 10/26/2018   CHOLHDL 3.6 01/31/2017   K 4.5 04/23/2019   BUN 17 04/23/2019   CREATININE 1.31 (H) 06/25/2019   CREATININE 1.36 (H) 12/13/2016    Past medical and surgical history were reviewed and updated in EPIC.  No outpatient medications have been marked as taking for the 08/26/19 encounter (Appointment) with Yorel Redder, Harrell Gave, MD.    Allergies: Patient has no known allergies.  Social History   Tobacco Use  . Smoking status: Never Smoker  . Smokeless tobacco: Never Used    Vaping Use  . Vaping Use: Never used  Substance Use Topics  . Alcohol use: No  . Drug use: No    Family History  Problem Relation Age of Onset  . Dementia Mother 55  . Lung disease Father 68       collapsed lung  . Healthy Sister   . Healthy Brother   . Heart Problems Brother   . Healthy Son   . Healthy Sister   . Colon cancer Neg Hx   . Prostate cancer Neg Hx   . Heart disease Neg Hx     Review of Systems: A 12-system review of systems was performed and was negative except as noted in the HPI.  --------------------------------------------------------------------------------------------------  Physical Exam: There were no vitals taken for this visit.  General:  *** HEENT: No conjunctival pallor or scleral icterus. Facemask in place. Neck: Supple without lymphadenopathy, thyromegaly, JVD, or HJR. Lungs: Normal work of breathing. Clear to auscultation bilaterally without wheezes or crackles. Heart: Regular rate and rhythm without murmurs, rubs, or gallops. Non-displaced PMI. Abd: Bowel sounds present. Soft, NT/ND without hepatosplenomegaly Ext: No lower extremity edema. Radial, PT, and DP pulses are 2+ bilaterally. Skin: Warm and dry without rash.  EKG:  ***  Lab Results  Component Value Date   WBC 7.6 02/12/2019   HGB 14.9 02/12/2019   HCT 42.9 02/12/2019   MCV 88 02/12/2019   PLT 245 02/12/2019    Lab Results  Component Value Date   NA 141 04/23/2019   K 4.5 04/23/2019   CL 104 04/23/2019   CO2 23 04/23/2019   BUN 17 04/23/2019   CREATININE  1.31 (H) 06/25/2019   GLUCOSE 110 (H) 04/23/2019   ALT 19 10/26/2018    Lab Results  Component Value Date   CHOL 116 10/26/2018   HDL 33 (L) 10/26/2018   LDLCALC 57 10/26/2018   TRIG 130 10/26/2018   CHOLHDL 3.6 01/31/2017    --------------------------------------------------------------------------------------------------  ASSESSMENT AND PLAN: Harrell Gave Brittnee Gaetano, MD 08/26/2019 7:03 AM

## 2019-08-27 ENCOUNTER — Encounter: Payer: Self-pay | Admitting: Internal Medicine

## 2019-10-11 ENCOUNTER — Other Ambulatory Visit: Payer: Self-pay | Admitting: Family Medicine

## 2019-10-21 IMAGING — CR DG CHEST 2V
2 series · 2 of 2 positions shown · non-contrast
Comparison: None.

CLINICAL DATA: Piece of gross be stuck in throat with chest
discomfort today.

EXAM:
CHEST - 2 VIEW

[chest pa]
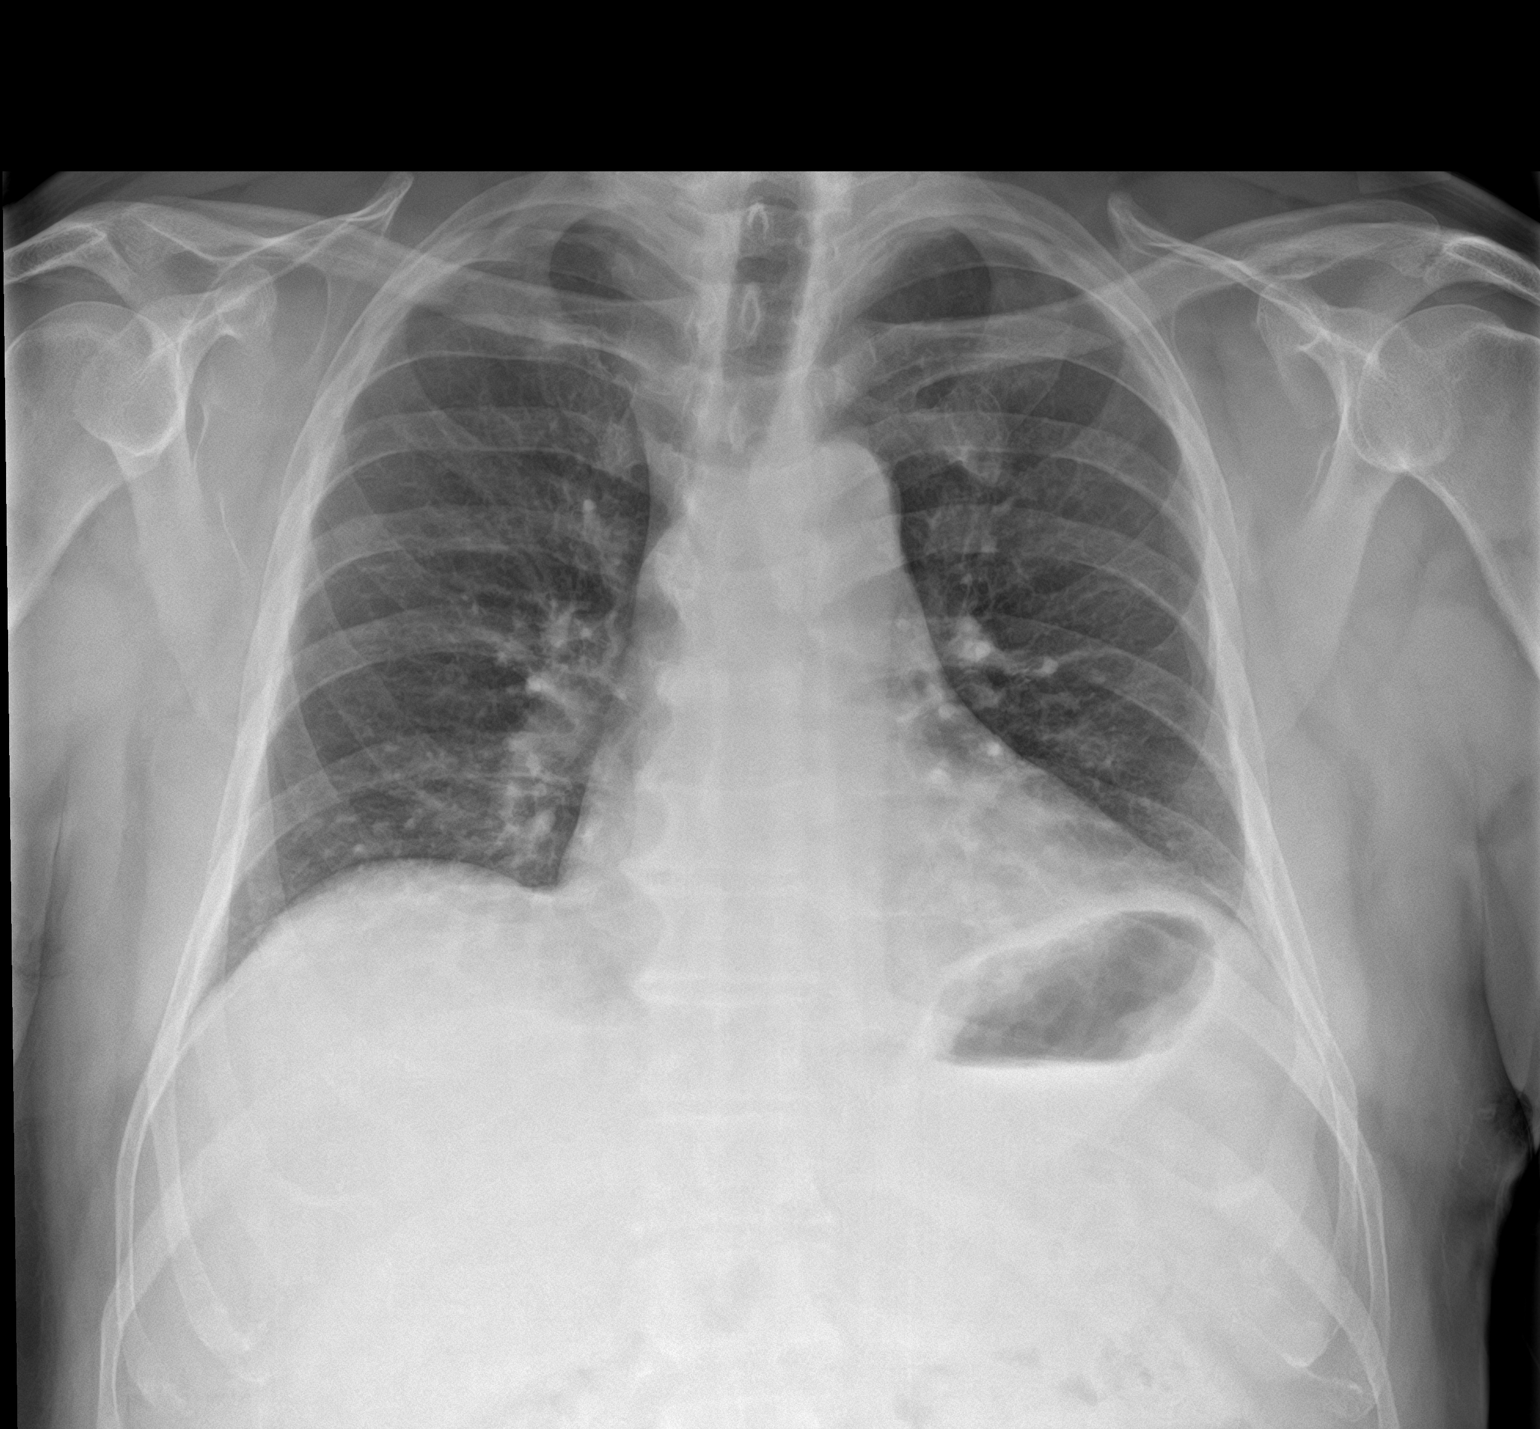

[chest lat]
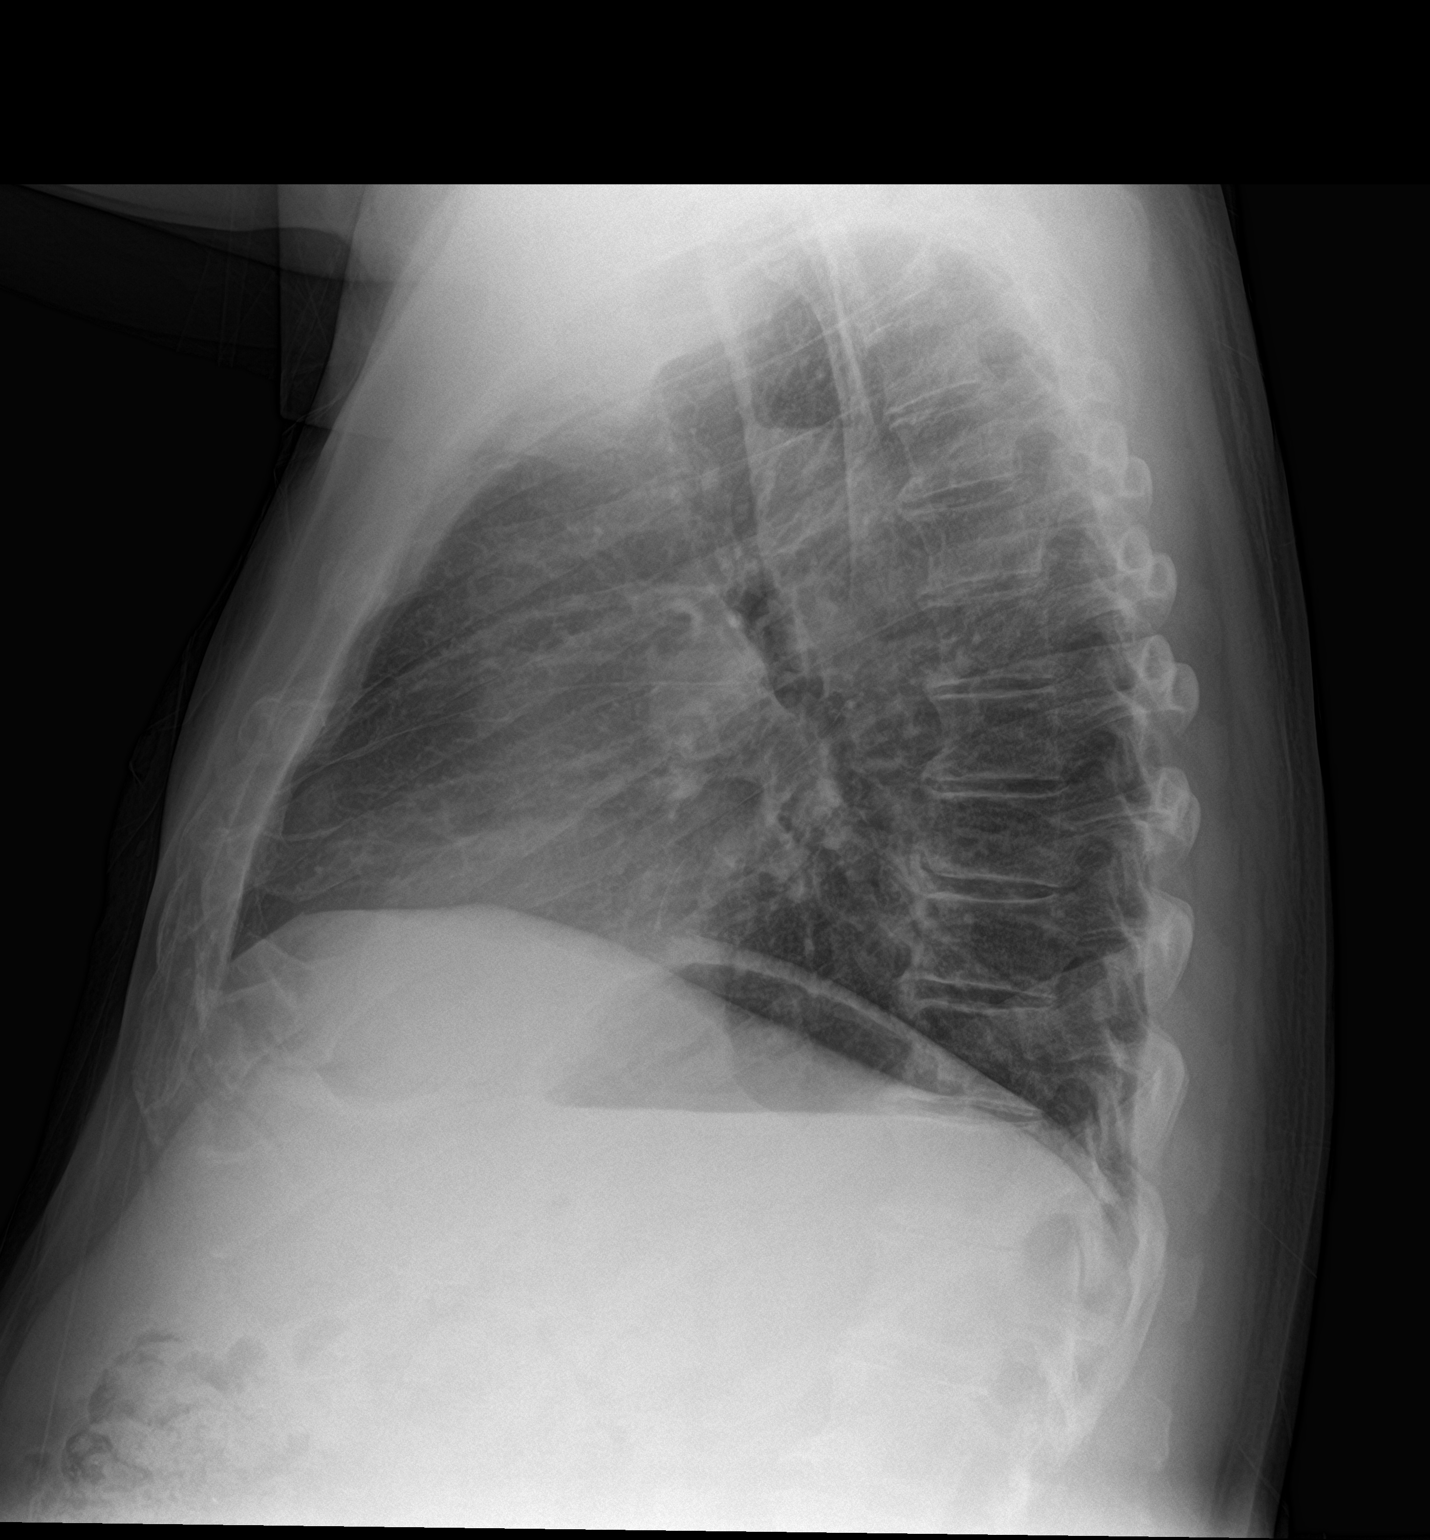

[2 of 2 positions shown; findings below may reference images not displayed]

FINDINGS: Borderline cardiomegaly. Nonaneurysmal thoracic aorta. Low lung
volumes with crowding of interstitial markings left basilar
atelectasis. No effusion or pneumothorax.

On the lateral view, note is made of an air-fluid level along the
expected course of the upper esophagus projecting within the
proximal third. Findings would be concordant with impacted food
bolus further distally within the esophagus. The bolus however is
not visualized radiographically.
IMPRESSION: Air-fluid level within the upper third of the thoracic esophagus is
noted on the lateral view in keeping with impacted food bolus
further distally. The food bolus is not apparent radiographically
however.

Minimal left basilar atelectasis. No pulmonary consolidation to
suggest aspiration.

Mild cardiomegaly.

## 2019-10-29 ENCOUNTER — Ambulatory Visit (INDEPENDENT_AMBULATORY_CARE_PROVIDER_SITE_OTHER): Payer: PPO | Admitting: Family Medicine

## 2019-10-29 ENCOUNTER — Encounter: Payer: Self-pay | Admitting: Gastroenterology

## 2019-10-29 ENCOUNTER — Other Ambulatory Visit: Payer: Self-pay

## 2019-10-29 ENCOUNTER — Encounter: Payer: Self-pay | Admitting: Family Medicine

## 2019-10-29 ENCOUNTER — Ambulatory Visit: Payer: PPO | Admitting: Gastroenterology

## 2019-10-29 VITALS — BP 122/68 | HR 54 | Temp 98.4°F | Ht 68.5 in | Wt 208.2 lb

## 2019-10-29 DIAGNOSIS — I10 Essential (primary) hypertension: Secondary | ICD-10-CM

## 2019-10-29 DIAGNOSIS — Z125 Encounter for screening for malignant neoplasm of prostate: Secondary | ICD-10-CM | POA: Diagnosis not present

## 2019-10-29 DIAGNOSIS — R739 Hyperglycemia, unspecified: Secondary | ICD-10-CM | POA: Diagnosis not present

## 2019-10-29 DIAGNOSIS — R7303 Prediabetes: Secondary | ICD-10-CM | POA: Insufficient documentation

## 2019-10-29 DIAGNOSIS — Z0000 Encounter for general adult medical examination without abnormal findings: Secondary | ICD-10-CM | POA: Diagnosis not present

## 2019-10-29 DIAGNOSIS — E782 Mixed hyperlipidemia: Secondary | ICD-10-CM

## 2019-10-29 DIAGNOSIS — K200 Eosinophilic esophagitis: Secondary | ICD-10-CM

## 2019-10-29 DIAGNOSIS — N1831 Chronic kidney disease, stage 3a: Secondary | ICD-10-CM

## 2019-10-29 MED ORDER — FLUTICASONE PROPIONATE HFA 220 MCG/ACT IN AERO: INHALATION_SPRAY | RESPIRATORY_TRACT | 2 refills | Status: DC

## 2019-10-29 NOTE — Progress Notes (Signed)
[name], MD [address]  [place]  [place], [place] [address]  Main: [phone]  Fax: [phone]    Gastroenterology Consultation  Referring Provider:     Virginia Crews, MD Primary Care Physician:  Virginia Crews, MD Primary Gastroenterologist:  Dr. [name] Reason for Consultation:     Chronic dysphagia        HPI:   Kevin [facility] is a 70 y.o. male referred by Dr. Brita Romp, Dionne Bucy, MD  for consultation & management of chronic dysphagia to solids. He experienced food impaction in 2016, went to ER, food bolus spontaneously passed after glucagon and soda.  Another epsidoe 31months later, went to ER again Did not have EGD during both episodes. Since then, he stayed away from hard foods due to dysphagia He is very careful of what he is eating, did not see GI until now due to lack of insurance Currently, having nocturnal heart burn if he has late dinner Currently not on PPI or H2 blocker He has mildly elevated serum creatinine, was thought to be secondary to ACE inhibitor and also, he had history of kidney stones. We do not have his baseline creatinine He does not smoke, or drink alcohol, denies NSAID use He is a Geophysicist/field seismologist by profession He denies any family history of GI malignancies  Follow-up visit 01/31/2017: Since his initial visit, he underwent EGD which revealed eosinophilic esophagitis and confirmed on biopsies. I started him on omeprazole 40 mg twice a day and he manages to take it 2 times daily most of the days. He tends to forget to take before breakfast. Today, he is accompanied by his wife and feels asymptomatic. He no longer has heartburn, regurgitation, dysphagia. On recent labs he is found to have elevated eosinophils in 600s  Follow-up visit 02/12/2019 Patient has not seen me for 2 years.  He has eosinophilic esophagitis for which he was maintained on omeprazole 40 mg twice daily.  I recommended follow-up EGD to assess  response to proton pump inhibitor which he did not.  He completed the prescription for omeprazole and did not contact my office since then.  He said he was asymptomatic for 1 year since last visit.  Within last 1 year, he started having episodes of food impactions, none of which he had to go to ER and spontaneously passed after receiving glucagon.  He also had intermittent episodes of food impactions at home. Since then he has been carefully eating and chewing well.  His weight has been stable.  Follow-up visit 06/25/2019 Patient reports doing well from dysphagia standpoint.  He has been taking omeprazole 40 mg twice daily.  He denies any acid reflux.  He took swallowed fluticasone for 2 months.  He is also gaining weight.  He is seen by cardiologist as well, no further work-up at this time  Follow-up visit 10/29/2019 Patient reports doing fairly well from dysphagia standpoint.  He has been taking swallowed fluticasone 2 times a day until a month ago, switched to once a day.  He reports that he had 3 episodes of food stuck in his throat, particularly hard foods and eventually passed.  He notices significant improvement since initiation of steroid inhaler.  He does not have any other concerns today.  Patient is accompanied with his wife  GI Procedures: had a colonoscopy at age 70 and at age 39, reportedly normal EGD 01/05/2017 The duodenal bulb and second portion of the duodenum were normal. The entire examined  stomach was normal. The cardia and gastric fundus were normal on retroflexion.  Mucosal changes including ringed esophagus, feline appearance, longitudinal furrows and congestion (edema) were found in the entire esophagus. Esophageal findings were graded using the Eosinophilic Esophagitis Endoscopic Reference Score (EoE-EREFS) as: Edema Grade 1 Present (decreased clarity or absence of vascular markings), Rings Grade 2 Moderate (distinct rings that do not occlude passage of diagnostic 8-10 mm  endoscope), Exudates Grade 0 None (no white lesions seen), Furrows Grade 1 Present (vertical lines with or without visible depth) and Stricture none (no stricture found). Biopsies were obtained from the proximal and distal esophagus with cold forceps for histology of suspected eosinophilic esophagitis. LA Grade B (one or more mucosal breaks greater than 5 mm, not extending between the tops of two mucosal folds) esophagitis with no bleeding was found 39 cm from the incisors.  Pathology: DIAGNOSIS:  A. DISTAL ESOPHAGUS; COLD BIOPSY:  - INCREASED INTRAEPITHELIAL EOSINOPHILS, UP TO 22 IN A HIGH-POWER FIELD,  SEE COMMENT.  - NEGATIVE FOR DYSPLASIA AND MALIGNANCY.   B. PROXIMAL ESOPHAGUS; COLD BIOPSY:  - INCREASED INTRAEPITHELIAL EOSINOPHILS, UP TO 23 IN A HIGH-POWER FIELD,  SEE COMMENT.  - NEGATIVE FOR DYSPLASIA AND MALIGNANCY.   Comment:  Sections show diffusely increased eosinophils with degranulation and  focal eosinophilic abscess formation. There is basal cell hyperplasia  and spongiosis. Changes in the proximal esophagus are slightly more  prominent than distal. These findings are nonspecific and may be seen in  GERD, eosinophilic esophagitis, systemic eosinophilic diseases and other  disorders. Combined features of GERD and eosinophilic esophagitis are  sometimes seen. Correlation with endoscopic findings is helpful to  distinguish between these diseases.   Upper endoscopy 07/03/2019 - Normal duodenal bulb and second portion of the duodenum. - Normal stomach. - Esophageal mucosal changes secondary to eosinophilic esophagitis. Biopsied.  DIAGNOSIS:  A. ESOPHAGUS, DISTAL; COLD BIOPSY:  - SQUAMOUS MUCOSA WITH INTRAEPITHELIAL EOSINOPHILS (UP TO 45 EOSINOPHILS  IN A HIGH-POWER FIELD) AND EOSINOPHILIC MICROABSCESS FORMATION,  CONSISTENT WITH PATIENT'S HISTORY OF EOSINOPHILIC ESOPHAGITIS.  - INTRAEPITHELIAL EOSINOPHILS ARE PRESENT IN ALL BIOPSY FRAGMENTS.  - NEGATIVE FOR DYSPLASIA  AND MALIGNANCY.   B. ESOPHAGUS, PROXIMAL; COLD BIOPSY:  - SQUAMOUS MUCOSA WITH INTRAEPITHELIAL EOSINOPHILS (UP TO 12 EOSINOPHILS  IN A HIGH-POWER FIELD), CONSISTENT WITH PATIENT'S HISTORY OF  EOSINOPHILIC ESOPHAGITIS.  - INTRAEPITHELIAL EOSINOPHILS ARE PRESENT IN THE MAJORITY OF THE BIOPSY  FRAGMENTS.  - NEGATIVE FOR DYSPLASIA AND MALIGNANCY.   Past Medical History:  Diagnosis Date  . Abdominal hernia    umbilical  . Chronic kidney disease (CKD), active medical management without dialysis   . Chronic kidney disease, stage III (moderate)   . Coronary artery disease   . GERD (gastroesophageal reflux disease)   . History of kidney stones    h/o  . Kidney stones   . MI (myocardial infarction) Kevin Melton) 2013   1 stent placed-sees Dr Estrella Myrtle) last seen in nov 2018    Past Surgical History:  Procedure Laterality Date  . COLONOSCOPY  2013  . CORONARY ANGIOPLASTY WITH STENT PLACEMENT  2013   [place]  . ESOPHAGOGASTRODUODENOSCOPY (EGD) WITH PROPOFOL N/A 01/05/2017   Procedure: ESOPHAGOGASTRODUODENOSCOPY (EGD) WITH PROPOFOL With Dilation;  Surgeon: Lin Landsman, MD;  Location: [facility];  Service: Gastroenterology;  Laterality: N/A;  . ESOPHAGOGASTRODUODENOSCOPY (EGD) WITH PROPOFOL N/A 07/03/2019   Procedure: ESOPHAGOGASTRODUODENOSCOPY (EGD) WITH PROPOFOL;  Surgeon: Lin Landsman, MD;  Location: [facility];  Service: Gastroenterology;  Laterality: N/A;  . HERNIA REPAIR    .  LITHOTRIPSY  2014  . UMBILICAL HERNIA REPAIR N/A 02/20/2017   Primary repair 2 cm umbilical hernia;  Surgeon: Robert Bellow, MD;  Location: [facility] ORS;  Service: General;  Laterality: N/A;     Current Outpatient Medications:  .  allopurinol (ZYLOPRIM) 300 MG tablet, Take 1 tablet (300 mg total) by mouth at bedtime., Disp: 90 tablet, Rfl: 3 .  amLODipine (NORVASC) 5 MG tablet, Take 1 tablet (5 mg total) by mouth daily., Disp: 90 tablet, Rfl: 1 .  aspirin 81 MG chewable tablet, Chew  81 mg by mouth at bedtime. , Disp: , Rfl:  .  atorvastatin (LIPITOR) 40 MG tablet, TAKE ONE TABLET BY MOUTH ONCE DAILY, Disp: 90 tablet, Rfl: 0 .  benazepril (LOTENSIN) 20 MG tablet, Take 1 tablet (20 mg total) by mouth at bedtime., Disp: 90 tablet, Rfl: 1 .  fluticasone (FLOVENT HFA) 220 MCG/ACT inhaler, Take 2 puffs and swallow twice daily. **RINSE MOUTH AFTER EACH USE** **DON'T EAT or drink 30 MINS AFTER**, Disp: 1 each, Rfl: 2 .  nitroGLYCERIN (NITROSTAT) 0.4 MG SL tablet, Place 0.4 mg under the tongue every 5 (five) minutes as needed for chest pain. , Disp: , Rfl:  .  triamcinolone ointment (KENALOG) 0.5 %, Apply 1 application topically 2 (two) times daily., Disp: 30 g, Rfl: 3   Family History  Problem Relation Age of Onset  . Dementia Mother 66  . Lung disease Father 59       collapsed lung  . Healthy Sister   . Healthy Brother   . Heart Problems Brother   . Healthy Son   . Healthy Sister   . Colon cancer Neg Hx   . Prostate cancer Neg Hx   . Heart disease Neg Hx      Social History   Tobacco Use  . Smoking status: Never Smoker  . Smokeless tobacco: Never Used  Vaping Use  . Vaping Use: Never used  Substance Use Topics  . Alcohol use: No  . Drug use: No    Allergies as of 10/29/2019  . (No Known Allergies)    Review of Systems:    All systems reviewed and negative except where noted in HPI.   Physical Exam:  BP 127/66 (BP Location: Left Arm, Patient Position: Sitting, Cuff Size: Normal)   Pulse 76   Temp 98.3 F (36.8 C) (Oral)   Ht 5\' 9"  (1.753 m)   Wt 209 lb 2 oz (94.9 kg)   BMI 30.88 kg/m  No LMP for male patient.  General:   Alert,  Well-developed, well-nourished, pleasant and cooperative in NAD Head:  Normocephalic and atraumatic. Eyes:  Sclera clear, no icterus.   Conjunctiva pink. Ears:  Normal auditory acuity. Nose:  No deformity, discharge, or lesions. Mouth:  No deformity or lesions,oropharynx pink & moist. Neck:  Supple; no masses or  thyromegaly. Lungs:  Respirations even and unlabored.  Clear throughout to auscultation.   No wheezes, crackles, or rhonchi. No acute distress. Heart:  Regular rate and rhythm; no murmurs, clicks, rubs, or gallops. Abdomen:  Normal bowel sounds. Soft, obese,non-tender and non-distended, No hepatosplenomegaly noted.  No guarding or rebound tenderness.   Rectal: Nor performed Msk:  Symmetrical without gross deformities. Good, equal movement & strength bilaterally. Pulses:  Normal pulses noted. Extremities:  No clubbing or edema.  No cyanosis. Neurologic:  Alert and oriented x3;  grossly normal neurologically. Skin:  Intact without significant lesions or rashes. No jaundice. Psych:  Alert and cooperative. Normal mood  and affect.  Imaging Studies: none  Assessment and Plan:   Sajid Ruppert Novak is a 69 y.o. male with eosinophilic esophagitis, currently in clinical remission  1. Chronic dysphagia: Due to eosinophilic esophagitis, biopsy proven EREFS score 3, now with recurrence of dysphagia - Continue swallowed fluticasone 220 MCG, twice daily - Recommend repeat EGD with biopsies to assess response to above therapy  2. Peripheral blood eosinophilia -Normal CBC with differential  3. Colonoscopy at age 12  Follow up in 4 months   [name], MD

## 2019-10-29 NOTE — Assessment & Plan Note (Signed)
Well controlled Continue current medications Recheck metabolic panel F/u in 6 months  

## 2019-10-29 NOTE — Assessment & Plan Note (Signed)
Followed by Nephrology Recheck metabolic panel Avoid nephrotoxic meds

## 2019-10-29 NOTE — Progress Notes (Signed)
Annual Wellness Visit     Patient: Kevin Melton, Male    DOB: [birth year], 69 y.o.   MRN: [id number] Visit Date: 10/29/2019  Today's Provider: Lavon Paganini, MD   Chief Complaint  Patient presents with  . Annual Exam  . Hyperlipidemia  . Hypertension  I,Porsha C McClurkin,acting as a scribe for Lavon Paganini, MD.,have documented all relevant documentation on the behalf of Lavon Paganini, MD,as directed by  Lavon Paganini, MD while in the presence of Lavon Paganini, MD.  Subjective    Kevin Melton is a 69 y.o. male who presents today for his Annual Wellness Visit. He reports consuming a general and low carb diet. Home exercise routine includes treadmill. He generally feels well. He reports sleeping well. He does not have additional problems to discuss today.   HPI AWE was on 04/23/2019  Hypertension, follow-up  BP Readings from Last 3 Encounters:  10/29/19 122/68  07/03/19 127/76  06/25/19 (!) 167/79   Wt Readings from Last 3 Encounters:  10/29/19 208 lb 3.2 oz (94.4 kg)  07/03/19 218 lb 7.6 oz (99.1 kg)  06/25/19 218 lb 8 oz (99.1 kg)     He was last seen for hypertension 6 months ago.  BP at that visit was 130/64. Management since that visit includes no changes. He reports good compliance with treatment. He is not having side effects.  He is exercising. He is adherent to low salt diet.   Outside blood pressures are not being checked.  He does not smoke.  Use of agents associated with hypertension: none.   --------------------------------------------------------------------------------------------------- Lipid/Cholesterol, follow-up  Last Lipid Panel: Lab Results  Component Value Date   CHOL 116 10/26/2018   LDLCALC 57 10/26/2018   HDL 33 (L) 10/26/2018   TRIG 130 10/26/2018    He was last seen for this 6 months ago.  Management since that visit includes no changes.  He reports good compliance with treatment. He is  not having side effects.   Symptoms: No appetite changes No foot ulcerations  No chest pain No chest pressure/discomfort  No dyspnea No orthopnea  No fatigue No lower extremity edema  No palpitations No paroxysmal nocturnal dyspnea  No nausea No numbness or tingling of extremity  No polydipsia No polyuria  No speech difficulty No syncope   He is following a Regular, low carb diet. Current exercise: treadmill  Last metabolic panel Lab Results  Component Value Date   GLUCOSE 110 (H) 04/23/2019   NA 141 04/23/2019   K 4.5 04/23/2019   BUN 17 04/23/2019   CREATININE 1.31 (H) 06/25/2019   GFRNONAA 56 (L) 06/25/2019   GFRAA 65 06/25/2019   CALCIUM 9.1 04/23/2019   AST 14 10/26/2018   ALT 19 10/26/2018   The ASCVD Risk score (Goff DC Jr., et al., 2013) failed to calculate for the following reasons:   The patient has a prior MI or stroke diagnosis  ---------------------------------------------------------------------------------------------------      Medications: Outpatient Medications Prior to Visit  Medication Sig  . allopurinol (ZYLOPRIM) 300 MG tablet Take 1 tablet (300 mg total) by mouth at bedtime.  Marland Kitchen amLODipine (NORVASC) 5 MG tablet Take 1 tablet (5 mg total) by mouth daily.  Marland Kitchen aspirin 81 MG chewable tablet Chew 81 mg by mouth at bedtime.   Marland Kitchen atorvastatin (LIPITOR) 40 MG tablet TAKE ONE TABLET BY MOUTH ONCE DAILY  . benazepril (LOTENSIN) 20 MG tablet Take 1 tablet (20 mg total) by mouth at bedtime.  . fluticasone (  FLOVENT HFA) 220 MCG/ACT inhaler Take 2 puffs and swallow once daily. **RINSE MOUTH AFTER EACH USE** **DON'T EAT or drink 30 MINS AFTER**  . nitroGLYCERIN (NITROSTAT) 0.4 MG SL tablet Place 0.4 mg under the tongue every 5 (five) minutes as needed for chest pain.   Marland Kitchen omeprazole (PRILOSEC) 40 MG capsule TAKE 1 CAPSULE BY MOUTH TWICE A DAY  . triamcinolone ointment (KENALOG) 0.5 % Apply 1 application topically 2 (two) times daily.   No facility-administered  medications prior to visit.    No Known Allergies  Patient Care Team: Virginia Crews, MD as PCP - General (Family Medicine) End, Harrell Gave, MD as PCP - Cardiology (Cardiology) Murlean Iba, MD (Nephrology)  Review of Systems  Constitutional: Negative.   HENT: Negative.   Eyes: Negative.   Respiratory: Negative.   Cardiovascular: Negative.   Gastrointestinal: Negative.   Endocrine: Negative.   Genitourinary: Negative.   Musculoskeletal: Negative.   Skin: Negative.   Allergic/Immunologic: Negative.   Neurological: Negative.   Hematological: Negative.   Psychiatric/Behavioral: Negative.       Objective    Vitals: BP 122/68 (BP Location: Left Arm, Patient Position: Sitting, Cuff Size: Normal)   Pulse (!) 54   Temp 98.4 F (36.9 C) (Oral)   Ht 5' 8.5" (1.74 m)   Wt 208 lb 3.2 oz (94.4 kg)   SpO2 98%   BMI 31.20 kg/m    Physical Exam Constitutional:      Appearance: Normal appearance.  HENT:     Right Ear: Tympanic membrane, ear canal and external ear normal.     Left Ear: Tympanic membrane, ear canal and external ear normal.  Cardiovascular:     Rate and Rhythm: Normal rate and regular rhythm.     Pulses: Normal pulses.     Heart sounds: Normal heart sounds.  Pulmonary:     Effort: Pulmonary effort is normal.     Breath sounds: Normal breath sounds.  Abdominal:     General: Abdomen is flat. Bowel sounds are normal.     Palpations: Abdomen is soft.  Skin:    General: Skin is warm and dry.  Neurological:     General: No focal deficit present.     Mental Status: He is alert and oriented to person, place, and time.  Psychiatric:        Mood and Affect: Mood normal.        Behavior: Behavior normal.     Most recent functional status assessment: In your present state of health, do you have any difficulty performing the following activities: 10/29/2019  Hearing? Y  Comment -  Vision? N  Difficulty concentrating or making decisions? N  Walking or  climbing stairs? N  Dressing or bathing? N  Doing errands, shopping? N  Preparing Food and eating ? -  Using the Toilet? -  In the past six months, have you accidently leaked urine? -  Do you have problems with loss of bowel control? -  Managing your Medications? -  Managing your Finances? -  Housekeeping or managing your Housekeeping? -  Some recent data might be hidden   Most recent fall risk assessment: Fall Risk  10/29/2019  Falls in the past year? 0  Number falls in past yr: 0  Injury with Fall? 0    Most recent depression screenings: PHQ 2/9 Scores 10/29/2019 04/23/2019  PHQ - 2 Score 0 0  PHQ- 9 Score 0 -   Most recent cognitive screening: 6CIT Screen 04/19/2018  What  Year? 0 points  What month? 0 points  What time? 0 points  Count back from 20 0 points  Months in reverse 0 points  Repeat phrase 0 points  Total Score 0   Most recent Audit-C alcohol use screening Alcohol Use Disorder Test (AUDIT) 10/29/2019  1. How often do you have a drink containing alcohol? 0  2. How many drinks containing alcohol do you have on a typical day when you are drinking? 0  3. How often do you have six or more drinks on one occasion? 0  AUDIT-C Score 0   A score of 3 or more in women, and 4 or more in men indicates increased risk for alcohol abuse, EXCEPT if all of the points are from question 1   No results found for any visits on 10/29/19.  Assessment & Plan     Annual wellness visit done today including the all of the following: Reviewed patient's Family Medical History Reviewed and updated list of patient's medical providers Assessment of cognitive impairment was done Assessed patient's functional ability Established a written schedule for health screening [place] Completed and Reviewed  Exercise Activities and Dietary recommendations Goals    . Exercise 3x per week (30 min per time)     Recommend to start fast walking three times a week for at least  30 minutes at a time.        Immunization History  Administered Date(s) Administered  . Fluad Quad(high Dose 65+) 10/26/2018  . Influenza, High Dose Seasonal PF 11/10/2016, 12/19/2017  . Pneumococcal Conjugate-13 11/10/2016  . Pneumococcal Polysaccharide-23 04/19/2018    Health Maintenance  Topic Date Due  . COVID-19 Vaccine (1) Never done  . INFLUENZA VACCINE  10/06/2019  . TETANUS/TDAP  04/22/2020 (Originally 10/22/1970)  . Fecal DNA (Cologuard)  04/06/2020  . Hepatitis C Screening  Completed  . PNA vac Low Risk Adult  Completed     Discussed health benefits of physical activity, and encouraged him to engage in regular exercise appropriate for his age and condition.    Problem List Items Addressed This Visit      Cardiovascular and Mediastinum   Essential hypertension with goal blood pressure less than 130/80    Well controlled Continue current medications Recheck metabolic panel F/u in 6 months       Relevant Orders   Comprehensive metabolic panel     Genitourinary   Chronic kidney disease, stage III (moderate)    Followed by Nephrology Recheck metabolic panel Avoid nephrotoxic meds      Relevant Orders   Comprehensive metabolic panel     Other   Mixed hyperlipidemia    Recheck FLP and CMP Continue statin      Relevant Orders   Lipid panel   Comprehensive metabolic panel   Hyperglycemia    Noted on recent labs Check A1c      Relevant Orders   Hemoglobin A1c    Other Visit Diagnoses    Annual physical exam    -  Primary   Relevant Orders   Lipid panel   Comprehensive metabolic panel   Hemoglobin A1c   Screening for prostate cancer       Relevant Orders   PSA       Return in about 6 months (around 04/30/2020) for  for HTN and HLD.     I, Lavon Paganini, MD, have reviewed all documentation for this visit. The documentation on 10/29/19 for the exam, diagnosis, procedures, and orders  are all accurate and complete.   Jawanza Zambito, Dionne Bucy, MD, MPH [facility]

## 2019-10-29 NOTE — Patient Instructions (Signed)

## 2019-10-29 NOTE — Assessment & Plan Note (Signed)
Recheck FLP and CMP Continue statin

## 2019-10-29 NOTE — Assessment & Plan Note (Signed)
Noted on recent labs Check A1c

## 2019-10-30 ENCOUNTER — Telehealth: Payer: Self-pay

## 2019-10-30 DIAGNOSIS — R972 Elevated prostate specific antigen [PSA]: Secondary | ICD-10-CM

## 2019-10-30 LAB — COMPREHENSIVE METABOLIC PANEL
ALT: 15 IU/L (ref 0–44)
AST: 13 IU/L (ref 0–40)
Albumin/Globulin Ratio: 1.5 (ref 1.2–2.2)
Albumin: 4.3 g/dL (ref 3.8–4.8)
Alkaline Phosphatase: 78 IU/L (ref 48–121)
BUN/Creatinine Ratio: 18 (ref 10–24)
BUN: 21 mg/dL (ref 8–27)
Bilirubin Total: 0.5 mg/dL (ref 0.0–1.2)
CO2: 23 mmol/L (ref 20–29)
Calcium: 9.1 mg/dL (ref 8.6–10.2)
Chloride: 105 mmol/L (ref 96–106)
Creatinine, Ser: 1.18 mg/dL (ref 0.76–1.27)
GFR calc Af Amer: 73 mL/min/{1.73_m2} (ref >59)
GFR calc non Af Amer: 63 mL/min/{1.73_m2} (ref >59)
Globulin, Total: 2.8 g/dL (ref 1.5–4.5)
Glucose: 98 mg/dL (ref 65–99)
Potassium: 4.6 mmol/L (ref 3.5–5.2)
Sodium: 142 mmol/L (ref 134–144)
Total Protein: 7.1 g/dL (ref 6.0–8.5)

## 2019-10-30 LAB — LIPID PANEL
Chol/HDL Ratio: 3.3 ratio (ref 0.0–5.0)
Cholesterol, Total: 117 mg/dL (ref 100–199)
HDL: 36 mg/dL — ABNORMAL LOW (ref >39)
LDL Chol Calc (NIH): 60 mg/dL (ref 0–99)
Triglycerides: 116 mg/dL (ref 0–149)
VLDL Cholesterol Cal: 21 mg/dL (ref 5–40)

## 2019-10-30 LAB — HEMOGLOBIN A1C
Est. average glucose Bld gHb Est-mCnc: 137 mg/dL
Hgb A1c MFr Bld: 6.4 % — ABNORMAL HIGH (ref 4.8–5.6)

## 2019-10-30 LAB — PSA: Prostate Specific Ag, Serum: 4.7 ng/mL — ABNORMAL HIGH (ref 0.0–4.0)

## 2019-10-30 NOTE — Telephone Encounter (Signed)
Result note read to patient, verbalizes understanding. Is agreeable to urology referral. Will CB for F/U appts.

## 2019-10-30 NOTE — Telephone Encounter (Signed)
Left message to call back. Ok for [facility] to advise pt of results.

## 2019-10-30 NOTE — Telephone Encounter (Signed)
-----   Message from Virginia Crews, MD sent at 10/30/2019  8:17 AM EDT ----- Normal labs, except A1c is at the upper end of prediabetic range.  Need to work on low carb diet to prevent progression to diabetes.  Follow-up and recheck in 3 to 6 months.  PSA, prostate cancer screening test, is also elevated above the normal range.  There is no baseline to compare to as this has not been checked previously.  Recommend referral to urology for further work-up.  As we discussed, this test does have a high false positive rate, and there is no need to worry about it until we get urology's input and a repeat test.

## 2019-10-31 ENCOUNTER — Other Ambulatory Visit
Admission: RE | Admit: 2019-10-31 | Discharge: 2019-10-31 | Disposition: A | Discharge status: Home / Self Care | Payer: PPO | Source: Ambulatory Visit | Attending: Gastroenterology | Admitting: Gastroenterology

## 2019-10-31 ENCOUNTER — Other Ambulatory Visit: Payer: Self-pay

## 2019-10-31 DIAGNOSIS — Z01812 Encounter for preprocedural laboratory examination: Secondary | ICD-10-CM | POA: Insufficient documentation

## 2019-10-31 DIAGNOSIS — Z20822 Contact with and (suspected) exposure to covid-19: Secondary | ICD-10-CM | POA: Diagnosis not present

## 2019-10-31 LAB — SARS CORONAVIRUS 2 (TAT 6-24 HRS): SARS Coronavirus 2: NEGATIVE

## 2019-11-01 ENCOUNTER — Encounter: Payer: Self-pay | Admitting: Gastroenterology

## 2019-11-04 ENCOUNTER — Encounter: Payer: Self-pay | Admitting: Gastroenterology

## 2019-11-04 ENCOUNTER — Ambulatory Visit: Payer: PPO | Admitting: Registered Nurse

## 2019-11-04 ENCOUNTER — Encounter
Admission: RE | Disposition: A | Discharge status: Home / Self Care | Payer: Self-pay | Source: Home / Self Care | Attending: Gastroenterology

## 2019-11-04 ENCOUNTER — Other Ambulatory Visit: Payer: Self-pay

## 2019-11-04 ENCOUNTER — Ambulatory Visit
Admission: RE | Admit: 2019-11-04 | Discharge: 2019-11-04 | Disposition: A | Discharge status: Home / Self Care | Payer: PPO | Attending: Gastroenterology | Admitting: Gastroenterology

## 2019-11-04 DIAGNOSIS — I252 Old myocardial infarction: Secondary | ICD-10-CM | POA: Diagnosis not present

## 2019-11-04 DIAGNOSIS — I129 Hypertensive chronic kidney disease with stage 1 through stage 4 chronic kidney disease, or unspecified chronic kidney disease: Secondary | ICD-10-CM | POA: Diagnosis not present

## 2019-11-04 DIAGNOSIS — Z7951 Long term (current) use of inhaled steroids: Secondary | ICD-10-CM | POA: Diagnosis not present

## 2019-11-04 DIAGNOSIS — R1314 Dysphagia, pharyngoesophageal phase: Secondary | ICD-10-CM | POA: Insufficient documentation

## 2019-11-04 DIAGNOSIS — K2100 Gastro-esophageal reflux disease with esophagitis, without bleeding: Secondary | ICD-10-CM | POA: Diagnosis not present

## 2019-11-04 DIAGNOSIS — Z87442 Personal history of urinary calculi: Secondary | ICD-10-CM | POA: Insufficient documentation

## 2019-11-04 DIAGNOSIS — Z79899 Other long term (current) drug therapy: Secondary | ICD-10-CM | POA: Diagnosis not present

## 2019-11-04 DIAGNOSIS — Z7982 Long term (current) use of aspirin: Secondary | ICD-10-CM | POA: Insufficient documentation

## 2019-11-04 DIAGNOSIS — K200 Eosinophilic esophagitis: Secondary | ICD-10-CM | POA: Insufficient documentation

## 2019-11-04 DIAGNOSIS — Z955 Presence of coronary angioplasty implant and graft: Secondary | ICD-10-CM | POA: Diagnosis not present

## 2019-11-04 DIAGNOSIS — E782 Mixed hyperlipidemia: Secondary | ICD-10-CM | POA: Diagnosis not present

## 2019-11-04 DIAGNOSIS — I2510 Atherosclerotic heart disease of native coronary artery without angina pectoris: Secondary | ICD-10-CM | POA: Diagnosis not present

## 2019-11-04 DIAGNOSIS — N183 Chronic kidney disease, stage 3 unspecified: Secondary | ICD-10-CM | POA: Insufficient documentation

## 2019-11-04 HISTORY — PX: ESOPHAGOGASTRODUODENOSCOPY (EGD) WITH PROPOFOL: SHX5813

## 2019-11-04 SURGERY — ESOPHAGOGASTRODUODENOSCOPY (EGD) WITH PROPOFOL
Anesthesia: General

## 2019-11-04 MED ORDER — GLYCOPYRROLATE 0.2 MG/ML IJ SOLN
INTRAMUSCULAR | Status: DC | PRN
  Administered 2019-11-04: .2 mg via INTRAVENOUS

## 2019-11-04 MED ORDER — SODIUM CHLORIDE 0.9 % IV SOLN: INTRAVENOUS | Status: DC

## 2019-11-04 MED ORDER — LIDOCAINE HCL (PF) 2 % IJ SOLN
INTRAMUSCULAR | Status: AC
  Filled 2019-11-04: qty 5

## 2019-11-04 MED ORDER — EPHEDRINE 5 MG/ML INJ
INTRAVENOUS | Status: AC
  Filled 2019-11-04: qty 10

## 2019-11-04 MED ORDER — PROPOFOL 10 MG/ML IV BOLUS
INTRAVENOUS | Status: DC | PRN
  Administered 2019-11-04: 90 mg via INTRAVENOUS

## 2019-11-04 MED ORDER — OMEPRAZOLE 40 MG PO CPDR: 40.0000 mg | DELAYED_RELEASE_CAPSULE | Freq: Every day | ORAL | 1 refills | Status: DC

## 2019-11-04 MED ORDER — PROPOFOL 500 MG/50ML IV EMUL
INTRAVENOUS | Status: AC
  Filled 2019-11-04: qty 50

## 2019-11-04 MED ORDER — PROPOFOL 500 MG/50ML IV EMUL
INTRAVENOUS | Status: DC | PRN
  Administered 2019-11-04: 150 ug/kg/min via INTRAVENOUS

## 2019-11-04 MED ORDER — GLYCOPYRROLATE 0.2 MG/ML IJ SOLN
INTRAMUSCULAR | Status: AC
  Filled 2019-11-04: qty 1

## 2019-11-04 NOTE — Transfer of Care (Signed)
Immediate Anesthesia Transfer of Care Note  Patient: Kevin Melton  Procedure(s) Performed: Procedure(s): ESOPHAGOGASTRODUODENOSCOPY (EGD) WITH PROPOFOL (N/A)  Patient Location: PACU and Endoscopy Unit  Anesthesia Type:General  Level of Consciousness: sedated  Airway & Oxygen Therapy: Patient Spontanous Breathing and Patient connected to nasal cannula oxygen  Post-op Assessment: Report given to RN and Post -op Vital signs reviewed and stable  Post vital signs: Reviewed and stable  Last Vitals:  Vitals:   11/04/19 0915 11/04/19 1111  BP: 126/75 (!) 109/54  Pulse: (!) 53 (!) 56  Resp: 16 14  Temp: (!) 36.2 C (!) 36.1 C  SpO2: 61% 96%    Complications: No apparent anesthesia complications

## 2019-11-04 NOTE — Anesthesia Procedure Notes (Signed)
Performed by: Karlis Cregg, CRNA Pre-anesthesia Checklist: Patient identified, Emergency Drugs available, Suction available and Patient being monitored Patient Re-evaluated:Patient Re-evaluated prior to induction Oxygen Delivery Method: Nasal cannula Induction Type: IV induction Dental Injury: Teeth and Oropharynx as per pre-operative assessment  Comments: Nasal cannula with etCO2 monitoring       

## 2019-11-04 NOTE — Op Note (Signed)
[facility] Patient Name: Kevin Melton Procedure Date: 1/65/5374 10:42 AM MRN: [id number] Account #: [id number] Date of Birth: April 18, 1950 Admit Type: Outpatient Age: 69 Room: [facility] ENDO ROOM 1 Gender: Male Note Status: Finalized Procedure:             Upper GI endoscopy Indications:           Esophageal dysphagia, Follow-up of eosinophilic                         esophagitis Providers:             Lin Landsman MD, MD Referring MD:          Dionne Bucy. Bacigalupo (Referring MD) Medicines:             Monitored Anesthesia Care Complications:         No immediate complications. Estimated blood loss: None. Procedure:             Pre-Anesthesia Assessment:                        - Prior to the procedure, a History and Physical was                         performed, and patient medications and allergies were                         reviewed. The patient is competent. The risks and                         benefits of the procedure and the sedation options and                         risks were discussed with the patient. All questions                         were answered and informed consent was obtained.                         Patient identification and proposed procedure were                         verified by the physician, the nurse, the                         anesthesiologist, the anesthetist and the technician                         in the pre-procedure area in the procedure room in the                         endoscopy suite. Mental Status Examination: alert and                         oriented. Airway Examination: normal oropharyngeal                         airway and neck mobility. Respiratory Examination:  clear to auscultation. CV Examination: normal.                         Prophylactic Antibiotics: The patient does not require                         prophylactic antibiotics. Prior Anticoagulants: The                          patient has taken no previous anticoagulant or                         antiplatelet agents. ASA Grade Assessment: III - A                         patient with severe systemic disease. After reviewing                         the risks and benefits, the patient was deemed in                         satisfactory condition to undergo the procedure. The                         anesthesia plan was to use monitored anesthesia care                         (MAC). Immediately prior to administration of                         medications, the patient was re-assessed for adequacy                         to receive sedatives. The heart rate, respiratory                         rate, oxygen saturations, blood pressure, adequacy of                         pulmonary ventilation, and response to care were                         monitored throughout the procedure. The physical                         status of the patient was re-assessed after the                         procedure.                        After obtaining informed consent, the endoscope was                         passed under direct vision. Throughout the procedure,                         the patient's blood pressure, pulse, and oxygen  saturations were monitored continuously. The Endoscope                         was introduced through the mouth, and advanced to the                         second part of duodenum. The upper GI endoscopy was                         accomplished without difficulty. The patient tolerated                         the procedure well. Findings:      The examined duodenum was normal.      The entire examined stomach was normal.      The cardia and gastric fundus were normal on retroflexion.      LA Grade B (one or more mucosal breaks greater than 5 mm, not extending       between the tops of two mucosal folds) esophagitis with no bleeding was       found in the lower  third of the esophagus. Biopsies were obtained from       the proximal and distal esophagus with cold forceps for histology of       suspected eosinophilic esophagitis. Impression:            - Normal examined duodenum.                        - Normal stomach.                        - LA Grade B reflux esophagitis with no bleeding.                         Biopsied. Recommendation:        - Discharge patient to home (with escort).                        - Resume previous diet today.                        - Continue present medications.                        - Await pathology results.                        - Use Prilosec (omeprazole) 40 mg PO daily for 3                         months.                        - Return to my office as previously scheduled. Procedure Code(s):     --- Professional ---                        [phone], Esophagogastroduodenoscopy, flexible,                         transoral; with biopsy, single or multiple Diagnosis Code(s):     ---  Professional ---                        R13.14, Dysphagia, pharyngoesophageal phase                        X50.5, Eosinophilic esophagitis CPT copyright 2019 American Medical Association. All rights reserved. The codes documented in this report are preliminary and upon coder review may  be revised to meet current compliance requirements. Dr. Ulyess Mort Lin Landsman MD, MD 11/04/2019 11:08:38 AM This report has been signed electronically. Number of Addenda: 0 Note Initiated On: 11/04/2019 10:42 AM Estimated Blood Loss:  Estimated blood loss: none.      [facility]

## 2019-11-04 NOTE — H&P (Signed)
[name], MD [address]  [place]  [place], [place] [address]  Main: [phone]  Fax: [phone] Pager: [phone]  Primary Care Physician:  Virginia Crews, MD Primary Gastroenterologist:  Dr. [name]  Pre-Procedure History & Physical: HPI:  Kevin Melton is a 69 y.o. male is here for an endoscopy.   Past Medical History:  Diagnosis Date  . Abdominal hernia    umbilical  . Chronic kidney disease (CKD), active medical management without dialysis   . Chronic kidney disease, stage III (moderate)   . Coronary artery disease   . GERD (gastroesophageal reflux disease)   . History of kidney stones    h/o  . Kidney stones   . MI (myocardial infarction) [facility]) 2013   1 stent placed-sees Dr Estrella Myrtle) last seen in nov 2018    Past Surgical History:  Procedure Laterality Date  . COLONOSCOPY  2013  . CORONARY ANGIOPLASTY WITH STENT PLACEMENT  2013   [place]  . ESOPHAGOGASTRODUODENOSCOPY (EGD) WITH PROPOFOL N/A 01/05/2017   Procedure: ESOPHAGOGASTRODUODENOSCOPY (EGD) WITH PROPOFOL With Dilation;  Surgeon: Lin Landsman, MD;  Location: [facility] ENDOSCOPY;  Service: Gastroenterology;  Laterality: N/A;  . ESOPHAGOGASTRODUODENOSCOPY (EGD) WITH PROPOFOL N/A 07/03/2019   Procedure: ESOPHAGOGASTRODUODENOSCOPY (EGD) WITH PROPOFOL;  Surgeon: Lin Landsman, MD;  Location: [facility] ENDOSCOPY;  Service: Gastroenterology;  Laterality: N/A;  . HERNIA REPAIR    . LITHOTRIPSY  2014  . UMBILICAL HERNIA REPAIR N/A 02/20/2017   Primary repair 2 cm umbilical hernia;  Surgeon: Robert Bellow, MD;  Location: [facility] ORS;  Service: General;  Laterality: N/A;    Prior to Admission medications   Medication Sig Start Date End Date Taking? Authorizing Provider  allopurinol (ZYLOPRIM) 300 MG tablet Take 1 tablet (300 mg total) by mouth at bedtime. 05/10/19  Yes Bacigalupo, Dionne Bucy, MD  amLODipine (NORVASC) 5 MG tablet Take 1 tablet (5 mg total) by mouth  daily. 07/18/19  Yes Bacigalupo, Dionne Bucy, MD  aspirin 81 MG chewable tablet Chew 81 mg by mouth at bedtime.    Yes [provider]  atorvastatin (LIPITOR) 40 MG tablet TAKE ONE TABLET BY MOUTH ONCE DAILY 10/11/19  Yes Bacigalupo, Dionne Bucy, MD  benazepril (LOTENSIN) 20 MG tablet Take 1 tablet (20 mg total) by mouth at bedtime. 07/18/19  Yes Bacigalupo, Dionne Bucy, MD  fluticasone (FLOVENT HFA) 220 MCG/ACT inhaler Take 2 puffs and swallow twice daily. **RINSE MOUTH AFTER EACH USE** **DON'T EAT or drink 30 MINS AFTER** 10/29/19  Yes Dvora Buitron, Tally Due, MD  nitroGLYCERIN (NITROSTAT) 0.4 MG SL tablet Place 0.4 mg under the tongue every 5 (five) minutes as needed for chest pain.  03/11/15   [provider]  triamcinolone ointment (KENALOG) 0.5 % Apply 1 application topically 2 (two) times daily. 04/23/19   Virginia Crews, MD    Allergies as of 10/29/2019  . (No Known Allergies)    Family History  Problem Relation Age of Onset  . Dementia Mother 96  . Lung disease Father 57       collapsed lung  . Healthy Sister   . Healthy Brother   . Heart Problems Brother   . Healthy Son   . Healthy Sister   . Colon cancer Neg Hx   . Prostate cancer Neg Hx   . Heart disease Neg Hx     Social History   Socioeconomic History  . Marital status: Married    Spouse name: [name]  . Number of children:  1  . Years of education: 29  . Highest education level: High school graduate  Occupational History  . Occupation: Self Employed    Comment: [employer]: semi-retired  Tobacco Use  . Smoking status: Never Smoker  . Smokeless tobacco: Never Used  Vaping Use  . Vaping Use: Never used  Substance and Sexual Activity  . Alcohol use: No  . Drug use: No  . Sexual activity: Yes    Partners: Female  Other Topics Concern  . Not on file  Social History Narrative  . Not on file   Social Determinants of Health   Financial Resource Strain: Low Risk   .  Difficulty of Paying Living Expenses: Not hard at all  Food Insecurity: No Food Insecurity  . Worried About Charity fundraiser in the Last Year: Never true  . Ran Out of Food in the Last Year: Never true  Transportation Needs: No Transportation Needs  . Lack of Transportation (Medical): No  . Lack of Transportation (Non-Medical): No  Physical Activity: Inactive  . Days of Exercise per Week: 0 days  . Minutes of Exercise per Session: 0 min  Stress: No Stress Concern Present  . Feeling of Stress : Not at all  Social Connections: Moderately Isolated  . Frequency of Communication with Friends and Family: More than three times a week  . Frequency of Social Gatherings with Friends and Family: Never  . Attends Religious Services: Never  . Active Member of Clubs or Organizations: No  . Attends Archivist Meetings: Never  . Marital Status: Married  Human resources officer Violence: Not At Risk  . Fear of Current or Ex-Partner: No  . Emotionally Abused: No  . Physically Abused: No  . Sexually Abused: No    Review of Systems: See HPI, otherwise negative ROS  Physical Exam: BP 126/75   Pulse (!) 53   Temp (!) 97.2 F (36.2 C) (Temporal)   Resp 16   Ht 5\' 9"  (1.753 m)   Wt 93 kg   SpO2 99%   BMI 30.27 kg/m  General:   Alert,  pleasant and cooperative in NAD Head:  Normocephalic and atraumatic. Neck:  Supple; no masses or thyromegaly. Lungs:  Clear throughout to auscultation.    Heart:  Regular rate and rhythm. Abdomen:  Soft, nontender and nondistended. Normal bowel sounds, without guarding, and without rebound.   Neurologic:  Alert and  oriented x4;  grossly normal neurologically.  Impression/Plan: Kevin Melton is here for an endoscopy to be performed for EoE  Risks, benefits, limitations, and alternatives regarding  endoscopy have been reviewed with the patient.  Questions have been answered.  All parties agreeable.   Sherri Sear, MD  11/04/2019, 10:00 AM

## 2019-11-04 NOTE — Anesthesia Preprocedure Evaluation (Signed)
Anesthesia Evaluation  Patient identified by MRN, date of birth, ID band Patient awake    Reviewed: Allergy & Precautions, H&P , NPO status , Patient's Chart, lab work & pertinent test results  History of Anesthesia Complications Negative for: history of anesthetic complications  Airway Mallampati: III  TM Distance: <3 FB Neck ROM: limited    Dental  (+) Chipped, Poor Dentition, Missing   Pulmonary neg shortness of breath,    Pulmonary exam normal        Cardiovascular Exercise Tolerance: Good hypertension, (-) angina+ CAD, + Past MI and + Cardiac Stents  (-) DOE Normal cardiovascular exam     Neuro/Psych negative neurological ROS  negative psych ROS   GI/Hepatic Neg liver ROS, GERD  Medicated and Controlled,  Endo/Other  negative endocrine ROS  Renal/GU Renal disease  negative genitourinary   Musculoskeletal   Abdominal   Peds  Hematology negative hematology ROS (+)   Anesthesia Other Findings Past Medical History: No date: Abdominal hernia     Comment:  umbilical No date: Chronic kidney disease (CKD), active medical management  without dialysis No date: Chronic kidney disease, stage III (moderate) No date: Coronary artery disease No date: GERD (gastroesophageal reflux disease) No date: History of kidney stones     Comment:  h/o No date: Kidney stones 2013: MI (myocardial infarction) ([place])     Comment:  1 stent placed-sees Dr End(cardiologist) last seen in               nov 2018  Past Surgical History: 2013: COLONOSCOPY 2013: CORONARY ANGIOPLASTY WITH STENT PLACEMENT     Comment:  [place] 01/05/2017: ESOPHAGOGASTRODUODENOSCOPY (EGD) WITH PROPOFOL; N/A     Comment:  Procedure: ESOPHAGOGASTRODUODENOSCOPY (EGD) WITH               PROPOFOL With Dilation;  Surgeon: Lin Landsman,               MD;  Location: [facility] ENDOSCOPY;  Service:               Gastroenterology;  Laterality: N/A; 07/03/2019:  ESOPHAGOGASTRODUODENOSCOPY (EGD) WITH PROPOFOL; N/A     Comment:  Procedure: ESOPHAGOGASTRODUODENOSCOPY (EGD) WITH               PROPOFOL;  Surgeon: Lin Landsman, MD;  Location:               [facility] ENDOSCOPY;  Service: Gastroenterology;  Laterality:               N/A; No date: HERNIA REPAIR 2014: LITHOTRIPSY 04/54/0981: UMBILICAL HERNIA REPAIR; N/A     Comment:  Primary repair 2 cm umbilical hernia;  Surgeon: Robert Bellow, MD;  Location: [facility] ORS;  Service: General;                Laterality: N/A;  BMI    Body Mass Index: 30.27 kg/m      Reproductive/Obstetrics negative OB ROS                             Anesthesia Physical Anesthesia Plan  ASA: III  Anesthesia Plan: General   Post-op Pain Management:    Induction: Intravenous  PONV Risk Score and Plan: Propofol infusion and TIVA  Airway Management Planned: Natural Airway and Nasal Cannula  Additional Equipment:   Intra-op Plan:   Post-operative Plan:   Informed  Consent: I have reviewed the patients History and Physical, chart, labs and discussed the procedure including the risks, benefits and alternatives for the proposed anesthesia with the patient or authorized representative who has indicated his/her understanding and acceptance.     Dental Advisory Given  Plan Discussed with: Anesthesiologist, CRNA and Surgeon  Anesthesia Plan Comments: (Patient consented for risks of anesthesia including but not limited to:  - adverse reactions to medications - risk of intubation if required - damage to eyes, teeth, lips or other oral mucosa - nerve damage due to positioning  - sore throat or hoarseness - Damage to heart, brain, nerves, lungs, other parts of body or loss of life  Patient voiced understanding.)        Anesthesia Quick Evaluation

## 2019-11-04 NOTE — Anesthesia Postprocedure Evaluation (Signed)
Anesthesia Post Note  Patient: Yarnell Arvidson Lepak  Procedure(s) Performed: ESOPHAGOGASTRODUODENOSCOPY (EGD) WITH PROPOFOL (N/A )  Patient location during evaluation: Endoscopy Anesthesia Type: General Level of consciousness: awake and alert Pain management: pain level controlled Vital Signs Assessment: post-procedure vital signs reviewed and stable Respiratory status: spontaneous breathing, nonlabored ventilation, respiratory function stable and patient connected to nasal cannula oxygen Cardiovascular status: blood pressure returned to baseline and stable Postop Assessment: no apparent nausea or vomiting Anesthetic complications: no   No complications documented.   Last Vitals:  Vitals:   11/04/19 1131 11/04/19 1141  BP: 131/78 135/87  Pulse: (!) 52 60  Resp: 13 12  Temp:    SpO2: 99% 99%    Last Pain:  Vitals:   11/04/19 1141  TempSrc:   PainSc: 0-No pain                 Precious Haws Dacari Beckstrand

## 2019-11-05 ENCOUNTER — Ambulatory Visit: Payer: PPO | Admitting: Urology

## 2019-11-05 ENCOUNTER — Encounter: Payer: Self-pay | Admitting: Urology

## 2019-11-05 VITALS — BP 146/67 | HR 63 | Ht 69.0 in | Wt 205.0 lb

## 2019-11-05 DIAGNOSIS — N400 Enlarged prostate without lower urinary tract symptoms: Secondary | ICD-10-CM

## 2019-11-05 DIAGNOSIS — R972 Elevated prostate specific antigen [PSA]: Secondary | ICD-10-CM | POA: Insufficient documentation

## 2019-11-05 LAB — SURGICAL PATHOLOGY

## 2019-11-05 NOTE — Progress Notes (Signed)
11/05/2019 10:37 AM   Kevin Melton [birth year] [id number]  Referring provider: Virginia Melton, [address] [place],  [place] [address]  Chief Complaint  Patient presents with  . Elevated PSA    HPI: Kevin Melton is a 69 y.o. male seen at the request of Dr. Brita Melton for evaluation of an elevated PSA.   PSA drawn 10/29/2019 slightly elevated 4.7  No prior PSA results for comparison  No family history prostate cancer  Denies bothersome lower urinary tract symptoms  Has noted decreased semen volume  Denies flank, abdominal or pelvic pain  Denies dysuria or gross hematuria   PMH: Past Medical History:  Diagnosis Date  . Abdominal hernia    umbilical  . Chronic kidney disease (CKD), active medical management without dialysis   . Chronic kidney disease, stage III (moderate)   . Coronary artery disease   . GERD (gastroesophageal reflux disease)   . History of kidney stones    h/o  . Kidney stones   . MI (myocardial infarction) [facility]) 2013   1 stent placed-sees Dr Estrella Myrtle) last seen in nov 2018    Surgical History: Past Surgical History:  Procedure Laterality Date  . COLONOSCOPY  2013  . CORONARY ANGIOPLASTY WITH STENT PLACEMENT  2013   [place]  . ESOPHAGOGASTRODUODENOSCOPY (EGD) WITH PROPOFOL N/A 01/05/2017   Procedure: ESOPHAGOGASTRODUODENOSCOPY (EGD) WITH PROPOFOL With Dilation;  Surgeon: Lin Landsman, MD;  Location: [facility] ENDOSCOPY;  Service: Gastroenterology;  Laterality: N/A;  . ESOPHAGOGASTRODUODENOSCOPY (EGD) WITH PROPOFOL N/A 07/03/2019   Procedure: ESOPHAGOGASTRODUODENOSCOPY (EGD) WITH PROPOFOL;  Surgeon: Lin Landsman, MD;  [facility] ENDOSCOPY;  Service: Gastroenterology;  Laterality: N/A;  . ESOPHAGOGASTRODUODENOSCOPY (EGD) WITH PROPOFOL N/A 11/04/2019   Procedure: ESOPHAGOGASTRODUODENOSCOPY (EGD) WITH PROPOFOL;  Surgeon: Lin Landsman, MD;  Location: [facility] ENDOSCOPY;  Service:  Gastroenterology;  Laterality: N/A;  . HERNIA REPAIR    . LITHOTRIPSY  2014  . UMBILICAL HERNIA REPAIR N/A 02/20/2017   Primary repair 2 cm umbilical hernia;  Surgeon: Robert Bellow, MD;  Location: [facility] ORS;  Service: General;  Laterality: N/A;    Home Medications:  Allergies as of 11/05/2019   No Known Allergies     Medication List       Accurate as of November 05, 2019 10:37 AM. If you have any questions, ask your nurse or doctor.        allopurinol 300 MG tablet Commonly known as: ZYLOPRIM Take 1 tablet (300 mg total) by mouth at bedtime.   amLODipine 5 MG tablet Commonly known as: NORVASC Take 1 tablet (5 mg total) by mouth daily.   aspirin 81 MG chewable tablet Chew 81 mg by mouth at bedtime.   atorvastatin 40 MG tablet Commonly known as: LIPITOR TAKE ONE TABLET BY MOUTH ONCE DAILY   benazepril 20 MG tablet Commonly known as: LOTENSIN Take 1 tablet (20 mg total) by mouth at bedtime.   fluticasone 220 MCG/ACT inhaler Commonly known as: FLOVENT HFA Take 2 puffs and swallow twice daily. **RINSE MOUTH AFTER EACH USE** **DON'T EAT or drink 30 MINS AFTER**   nitroGLYCERIN 0.4 MG SL tablet Commonly known as: NITROSTAT Place 0.4 mg under the tongue every 5 (five) minutes as needed for chest pain.   omeprazole 40 MG capsule Commonly known as: PRILOSEC Take 1 capsule (40 mg total) by mouth daily before breakfast.   triamcinolone ointment 0.5 % Commonly known as: KENALOG Apply 1 application topically 2 (two) times daily.  Allergies: No Known Allergies  Family History: Family History  Problem Relation Age of Onset  . Dementia Mother 97  . Lung disease Father 68       collapsed lung  . Healthy Sister   . Healthy Brother   . Heart Problems Brother   . Healthy Son   . Healthy Sister   . Colon cancer Neg Hx   . Prostate cancer Neg Hx   . Heart disease Neg Hx     Social History:  reports that he has never smoked. He has never used smokeless  tobacco. He reports that he does not drink alcohol and does not use drugs.   Physical Exam: BP (!) 146/67   Pulse 63   Ht 5\' 9"  (1.753 m)   Wt 205 lb (93 kg)   BMI 30.27 kg/m   Constitutional:  Alert and oriented, No acute distress. HEENT: [place] AT, moist mucus membranes.  Trachea midline, no masses. Cardiovascular: No clubbing, cyanosis, or edema. Respiratory: Normal respiratory effort, no increased work of breathing. GI: Abdomen is soft, nontender, nondistended, no abdominal masses GU: Phallus without lesions, meatus normal.  Testes descended bilaterally without masses or tenderness, spermatic cord/epididymis palpably normal bilaterally.  Prostate 35 g, smooth without nodules Skin: No rashes, bruises or suspicious lesions. Neurologic: Grossly intact, no focal deficits, moving all 4 extremities. Psychiatric: Normal mood and affect.    Assessment & Plan:    1.  Elevated PSA  Although PSA is a prostate cancer screening test he was informed that cancer is not the most common cause of an elevated PSA. Other potential causes including BPH and inflammation were discussed. He was informed that the only way to adequately diagnose prostate cancer would be a transrectal ultrasound and biopsy of the prostate. The procedure was discussed including potential risks of bleeding and infection/sepsis. He was also informed that a negative biopsy does not conclusively rule out the possibility that prostate cancer may be present and that continued monitoring is required. The use of newer adjunctive blood tests including PHI and 4kScore were discussed. The use of multiparametric prostate MRI was also discussed however is not typically used for initial evaluation of an elevated PSA. Continued periodic surveillance was also discussed.  We discussed statistically the chances of him having prostate cancer is approximately 20% and of that 20% an ~5% chance of having clinically significant prostate cancer  We also  discussed age-specific guidelines and based on these his PSA level would be considered normal although we have no previous results for comparison  At this point he is leaning towards a 4K score however was given a brochure on insurance and billing to see what his out-of-pocket cost would be.  If the 4K is cost prohibitive he will repeat his PSA in 6 months with Dr. Brita Melton.    Abbie Sons, [place] [address] [place],  [address] [phone]

## 2020-01-06 ENCOUNTER — Other Ambulatory Visit: Payer: Self-pay | Admitting: Family Medicine

## 2020-01-06 NOTE — Telephone Encounter (Signed)
Approved per protocol.  

## 2020-01-25 ENCOUNTER — Other Ambulatory Visit: Payer: Self-pay | Admitting: Family Medicine

## 2020-04-23 NOTE — Progress Notes (Signed)
Subjective:   Kailer Heindel Freda is a 70 y.o. male who presents for Medicare Annual/Subsequent preventive examination.  Review of Systems    N/A  Cardiac Risk Factors include: advanced age (>23men, >31 women);dyslipidemia;male gender;hypertension     Objective:    Today's Vitals   04/27/20 0939  BP: (!) 128/56  Pulse: (!) 57  Temp: 99 F (37.2 C)  TempSrc: Oral  SpO2: 97%  Weight: 196 lb 6.4 oz (89.1 kg)  Height: 5\' 9"  (1.753 m)  PainSc: 0-No pain   Body mass index is 29 kg/m.  Advanced Directives 04/27/2020 11/04/2019 07/03/2019 04/23/2019 04/19/2018 12/23/2017 02/20/2017  Does Patient Have a Medical Advance Directive? No No No No No No No  Would patient like information on creating a medical advance directive? No - Patient declined No - Patient declined No - Patient declined No - Patient declined No - Patient declined - No - Patient declined    Current Medications (verified) Outpatient Encounter Medications as of 04/27/2020  Medication Sig  . allopurinol (ZYLOPRIM) 300 MG tablet Take 1 tablet (300 mg total) by mouth at bedtime.  Marland Kitchen amLODipine (NORVASC) 5 MG tablet TAKE 1 TABLET BY MOUTH EVERY DAY  . aspirin 81 MG chewable tablet Chew 81 mg by mouth at bedtime.   Marland Kitchen atorvastatin (LIPITOR) 40 MG tablet TAKE ONE TABLET BY MOUTH ONCE DAILY  . benazepril (LOTENSIN) 20 MG tablet TAKE 1 TABLET BY MOUTH EVERYDAY AT BEDTIME  . nitroGLYCERIN (NITROSTAT) 0.4 MG SL tablet Place 0.4 mg under the tongue every 5 (five) minutes as needed for chest pain.   Marland Kitchen triamcinolone ointment (KENALOG) 0.5 % Apply 1 application topically 2 (two) times daily.  . fluticasone (FLOVENT HFA) 220 MCG/ACT inhaler Take 2 puffs and swallow twice daily. **RINSE MOUTH AFTER EACH USE** **DON'T EAT or drink 30 MINS AFTER** (Patient not taking: Reported on 04/27/2020)  . omeprazole (PRILOSEC) 40 MG capsule Take 1 capsule (40 mg total) by mouth daily before breakfast. (Patient not taking: Reported on 04/27/2020)   No  facility-administered encounter medications on file as of 04/27/2020.    Allergies (verified) Patient has no known allergies.   History: Past Medical History:  Diagnosis Date  . Abdominal hernia    umbilical  . Chronic kidney disease (CKD), active medical management without dialysis   . Chronic kidney disease, stage III (moderate) ([facility])   . Coronary artery disease   . GERD (gastroesophageal reflux disease)   . History of kidney stones    h/o  . Hyperlipidemia   . Hypertension   . Kidney stones   . MI (myocardial infarction) [facility]) 2013   1 stent placed-sees Dr Estrella Myrtle) last seen in nov 2018   Past Surgical History:  Procedure Laterality Date  . COLONOSCOPY  2013  . CORONARY ANGIOPLASTY WITH STENT PLACEMENT  2013   [place]  . ESOPHAGOGASTRODUODENOSCOPY (EGD) WITH PROPOFOL N/A 01/05/2017   Procedure: ESOPHAGOGASTRODUODENOSCOPY (EGD) WITH PROPOFOL With Dilation;  Surgeon: Lin Landsman, MD;  Location: [facility] ENDOSCOPY;  Service: Gastroenterology;  Laterality: N/A;  . ESOPHAGOGASTRODUODENOSCOPY (EGD) WITH PROPOFOL N/A 07/03/2019   Procedure: ESOPHAGOGASTRODUODENOSCOPY (EGD) WITH PROPOFOL;  Surgeon: Lin Landsman, MD;  Location: [facility] ENDOSCOPY;  Service: Gastroenterology;  Laterality: N/A;  . ESOPHAGOGASTRODUODENOSCOPY (EGD) WITH PROPOFOL N/A 11/04/2019   Procedure: ESOPHAGOGASTRODUODENOSCOPY (EGD) WITH PROPOFOL;  Surgeon: Lin Landsman, MD;  Location: [facility] ENDOSCOPY;  Service: Gastroenterology;  Laterality: N/A;  . HERNIA REPAIR    . LITHOTRIPSY  2014  . UMBILICAL HERNIA REPAIR N/A 02/20/2017  Primary repair 2 cm umbilical hernia;  Surgeon: Robert Bellow, MD;  Location: [facility] ORS;  Service: General;  Laterality: N/A;   Family History  Problem Relation Age of Onset  . Dementia Mother 69  . Lung disease Father 43       collapsed lung  . Healthy Sister   . Healthy Brother   . Heart Problems Brother   . Healthy Son   . Healthy Sister   . Colon  cancer Neg Hx   . Prostate cancer Neg Hx   . Heart disease Neg Hx    Social History   Socioeconomic History  . Marital status: Married    Spouse name: [name]  . Number of children: 1  . Years of education: 37  . Highest education level: High school graduate  Occupational History  . Occupation: Self Employed    Comment: [employer]: semi-retired  Tobacco Use  . Smoking status: Never Smoker  . Smokeless tobacco: Never Used  Vaping Use  . Vaping Use: Never used  Substance and Sexual Activity  . Alcohol use: No  . Drug use: No  . Sexual activity: Yes    Partners: Female  Other Topics Concern  . Not on file  Social History Narrative  . Not on file   Social Determinants of Health   Financial Resource Strain: Low Risk   . Difficulty of Paying Living Expenses: Not hard at all  Food Insecurity: No Food Insecurity  . Worried About Charity fundraiser in the Last Year: Never true  . Ran Out of Food in the Last Year: Never true  Transportation Needs: No Transportation Needs  . Lack of Transportation (Medical): No  . Lack of Transportation (Non-Medical): No  Physical Activity: Insufficiently Active  . Days of Exercise per Week: 2 days  . Minutes of Exercise per Session: 60 min  Stress: No Stress Concern Present  . Feeling of Stress : Not at all  Social Connections: Moderately Isolated  . Frequency of Communication with Friends and Family: More than three times a week  . Frequency of Social Gatherings with Friends and Family: Three times a week  . Attends Religious Services: Never  . Active Member of Clubs or Organizations: No  . Attends Archivist Meetings: Never  . Marital Status: Married    Tobacco Counseling Counseling given: Not Answered   Clinical Intake:  Pre-visit preparation completed: Yes  Pain : No/denies pain Pain Score: 0-No pain     Nutritional Status: BMI 25 -29 Overweight Nutritional Risks: None Diabetes:  No  How often do you need to have someone help you when you read instructions, pamphlets, or other written materials from your doctor or pharmacy?: 1 - Never  Diabetic? No  Interpreter Needed?: No  Information entered by :: [facility], LPN   Activities of Daily Living In your present state of health, do you have any difficulty performing the following activities: 04/27/2020 10/29/2019  Hearing? Y Y  Comment Does not wear hearing aids. -  Vision? N N  Difficulty concentrating or making decisions? N N  Walking or climbing stairs? N N  Dressing or bathing? N N  Doing errands, shopping? N N  Preparing Food and eating ? N -  Using the Toilet? N -  In the past six months, have you accidently leaked urine? N -  Do you have problems with loss of bowel control? N -  Managing your Medications? N -  Managing your Finances? N -  Housekeeping or managing your Housekeeping? N -  Some recent data might be hidden    Patient Care Team: Virginia Crews, MD as PCP - General (Family Medicine) End, Harrell Gave, MD as PCP - Cardiology (Cardiology)  Indicate any recent Medical Services you may have received from other than [facility] providers in the past year (date may be approximate).     Assessment:   This is a routine wellness examination for [place].  Hearing/Vision screen No exam data present  Dietary issues and exercise activities discussed: Current Exercise Habits: Home exercise routine, Type of exercise: walking, Time (Minutes): 60, Frequency (Times/Week): 2, Weekly Exercise (Minutes/Week): 120, Intensity: Moderate, Exercise limited by: None identified  Goals    . Exercise 3x per week (30 min per time)     Recommend to start fast walking three times a week for at least 30 minutes at a time.       Depression Screen PHQ 2/9 Scores 04/27/2020 10/29/2019 04/23/2019 10/26/2018 04/19/2018 04/19/2018 03/21/2017  PHQ - 2 Score 0 0 0 0 0 0 0  PHQ- 9 Score - 0 - 0 0 - -    Fall Risk Fall Risk   04/27/2020 10/29/2019 10/29/2019 04/23/2019 04/19/2018  Falls in the past year? 0 0 0 0 0  Number falls in past yr: 0 0 0 0 -  Injury with Fall? 0 0 0 0 -    FALL RISK PREVENTION PERTAINING TO THE HOME:  Any stairs in or around the home? Yes  If so, are there any without handrails? No  Home free of loose throw rugs in walkways, pet beds, electrical cords, etc? Yes  Adequate lighting in your home to reduce risk of falls? Yes   ASSISTIVE DEVICES UTILIZED TO PREVENT FALLS:  Life alert? No  Use of a cane, walker or w/c? No  Grab bars in the bathroom? Yes  Shower chair or bench in shower? Yes  Elevated toilet seat or a handicapped toilet? No   TIMED UP AND GO:  Was the test performed? Yes .  Length of time to ambulate 10 feet: 8 sec.   Gait steady and fast without use of assistive device  Cognitive Function: Normal cognitive status assessed by direct observation by this Nurse Health Advisor. No abnormalities found.       6CIT Screen 04/19/2018  What Year? 0 points  What month? 0 points  What time? 0 points  Count back from 20 0 points  Months in reverse 0 points  Repeat phrase 0 points  Total Score 0    Immunizations Immunization History  Administered Date(s) Administered  . Fluad Quad(high Dose 65+) 10/26/2018  . Influenza, High Dose Seasonal PF 11/10/2016, 12/19/2017  . Moderna Sars-Covid-2 Vaccination 04/05/2019, 05/01/2019, 01/05/2020  . Pneumococcal Conjugate-13 11/10/2016  . Pneumococcal Polysaccharide-23 04/19/2018    TDAP status: Due, Education has been provided regarding the importance of this vaccine. Advised may receive this vaccine at local pharmacy or Health Dept. Aware to provide a copy of the vaccination record if obtained from local pharmacy or Health Dept. Verbalized acceptance and understanding.  Flu Vaccine status: Due, Education has been provided regarding the importance of this vaccine. Advised may receive this vaccine at local pharmacy or Health Dept.  Aware to provide a copy of the vaccination record if obtained from local pharmacy or Health Dept. Verbalized acceptance and understanding.  Pneumococcal vaccine status: Up to date  Covid-19 vaccine status: Completed vaccines  Qualifies for Shingles Vaccine?  Yes   Zostavax completed No   Shingrix Completed?: No.    Education has been provided regarding the importance of this vaccine. Patient has been advised to call insurance company to determine out of pocket expense if they have not yet received this vaccine. Advised may also receive vaccine at local pharmacy or Health Dept. Verbalized acceptance and understanding.  Screening Tests Health Maintenance  Topic Date Due  . INFLUENZA VACCINE  10/06/2019  . Fecal DNA (Cologuard)  04/06/2020  . TETANUS/TDAP  04/27/2021 (Originally 10/22/1969)  . COVID-19 Vaccine (4 - Booster for Moderna series) 07/04/2020  . Hepatitis C Screening  Completed  . PNA vac Low Risk Adult  Completed    Health Maintenance  Health Maintenance Due  Topic Date Due  . INFLUENZA VACCINE  10/06/2019  . Fecal DNA (Cologuard)  04/06/2020    Colorectal cancer screening: Cologuard due. Ordered today.   Lung Cancer Screening: (Low Dose CT Chest recommended if Age 61-80 years, 30 pack-year currently smoking OR have quit w/in 15years.) does not qualify.   Additional Screening:  Hepatitis C Screening: Up to date  Vision Screening: Recommended annual ophthalmology exams for early detection of glaucoma and other disorders of the eye. Is the patient up to date with their annual eye exam?  Yes  Who is the provider or what is the name of the office in which the patient attends annual eye exams? [facility] If pt is not established with a provider, would they like to be referred to a provider to establish care? No .   Dental Screening: Recommended annual dental exams for proper oral hygiene  Community Resource Referral / Chronic Care Management: CRR required this  visit?  No   CCM required this visit?  No      Plan:     I have personally reviewed and noted the following in the patient's chart:   . Medical and social history . Use of alcohol, tobacco or illicit drugs  . Current medications and supplements . Functional ability and status . Nutritional status . Physical activity . Advanced directives . List of other physicians . Hospitalizations, surgeries, and ER visits in previous 12 months . Vitals . Screenings to include cognitive, depression, and falls . Referrals and appointments  In addition, I have reviewed and discussed with patient certain preventive protocols, quality metrics, and best practice recommendations. A written personalized care plan for preventive services as well as general preventive health recommendations were provided to patient.     Terez Montee [place], [place]   08/01/7822   Nurse Notes: Pt to discuss receiving the flu shot with PCP today.

## 2020-04-27 ENCOUNTER — Ambulatory Visit (INDEPENDENT_AMBULATORY_CARE_PROVIDER_SITE_OTHER): Payer: PPO | Admitting: Family Medicine

## 2020-04-27 ENCOUNTER — Ambulatory Visit (INDEPENDENT_AMBULATORY_CARE_PROVIDER_SITE_OTHER): Payer: PPO

## 2020-04-27 ENCOUNTER — Encounter: Payer: Self-pay | Admitting: Family Medicine

## 2020-04-27 ENCOUNTER — Other Ambulatory Visit: Payer: Self-pay

## 2020-04-27 VITALS — BP 128/56 | HR 57 | Temp 99.0°F | Resp 16 | Ht 69.0 in | Wt 196.0 lb

## 2020-04-27 VITALS — BP 128/56 | HR 57 | Temp 99.0°F | Ht 69.0 in | Wt 196.4 lb

## 2020-04-27 DIAGNOSIS — R972 Elevated prostate specific antigen [PSA]: Secondary | ICD-10-CM

## 2020-04-27 DIAGNOSIS — E782 Mixed hyperlipidemia: Secondary | ICD-10-CM

## 2020-04-27 DIAGNOSIS — N1831 Chronic kidney disease, stage 3a: Secondary | ICD-10-CM

## 2020-04-27 DIAGNOSIS — Z1211 Encounter for screening for malignant neoplasm of colon: Secondary | ICD-10-CM | POA: Diagnosis not present

## 2020-04-27 DIAGNOSIS — E663 Overweight: Secondary | ICD-10-CM | POA: Diagnosis not present

## 2020-04-27 DIAGNOSIS — R7303 Prediabetes: Secondary | ICD-10-CM | POA: Diagnosis not present

## 2020-04-27 DIAGNOSIS — I10 Essential (primary) hypertension: Secondary | ICD-10-CM

## 2020-04-27 DIAGNOSIS — Z0000 Encounter for general adult medical examination without abnormal findings: Secondary | ICD-10-CM

## 2020-04-27 NOTE — Patient Instructions (Signed)
Kevin Melton , Thank you for taking time to come for your Medicare Wellness Visit. I appreciate your ongoing commitment to your health goals. Please review the following plan we discussed and let me know if I can assist you in the future.   Screening recommendations/referrals: Colonoscopy: Cologuard due. Ordered today.  Recommended yearly ophthalmology/optometry visit for glaucoma screening and checkup Recommended yearly dental visit for hygiene and checkup  Vaccinations: Influenza vaccine: Currently due, will discuss with PCP at today's apt.  Pneumococcal vaccine: Completed series Tdap vaccine: Currently due, declined receiving.  Shingles vaccine: Shingrix discussed. Please contact your pharmacy for coverage information.     Advanced directives: Advance directive discussed with you today. Even though you declined this today please call our office should you change your mind and we can give you the proper paperwork for you to fill out.  Conditions/risks identified: Continue to increase exercising to 3 days a week for 30 minutes or more at a time.   Next appointment: 10:20 AM today with Dr Brita Romp   Preventive Care 69 Years and Older, Male Preventive care refers to lifestyle choices and visits with your health care provider that can promote health and wellness. What does preventive care include?  A yearly physical exam. This is also called an annual well check.  Dental exams once or twice a year.  Routine eye exams. Ask your health care provider how often you should have your eyes checked.  Personal lifestyle choices, including:  Daily care of your teeth and gums.  Regular physical activity.  Eating a healthy diet.  Avoiding tobacco and drug use.  Limiting alcohol use.  Practicing safe sex.  Taking low doses of aspirin every day.  Taking vitamin and mineral supplements as recommended by your health care provider. What happens during an annual well check? The services  and screenings done by your health care provider during your annual well check will depend on your age, overall health, lifestyle risk factors, and family history of disease. Counseling  Your health care provider may ask you questions about your:  Alcohol use.  Tobacco use.  Drug use.  Emotional well-being.  Home and relationship well-being.  Sexual activity.  Eating habits.  History of falls.  Memory and ability to understand (cognition).  Work and work Statistician. Screening  You may have the following tests or measurements:  Height, weight, and BMI.  Blood pressure.  Lipid and cholesterol levels. These may be checked every 5 years, or more frequently if you are over 49 years old.  Skin check.  Lung cancer screening. You may have this screening every year starting at age 32 if you have a 30-pack-year history of smoking and currently smoke or have quit within the past 15 years.  Fecal occult blood test (FOBT) of the stool. You may have this test every year starting at age 3.  Flexible sigmoidoscopy or colonoscopy. You may have a sigmoidoscopy every 5 years or a colonoscopy every 10 years starting at age 42.  Prostate cancer screening. Recommendations will vary depending on your family history and other risks.  Hepatitis C blood test.  Hepatitis B blood test.  Sexually transmitted disease (STD) testing.  Diabetes screening. This is done by checking your blood sugar (glucose) after you have not eaten for a while (fasting). You may have this done every 1-3 years.  Abdominal aortic aneurysm (AAA) screening. You may need this if you are a current or former smoker.  Osteoporosis. You may be screened starting at age  70 if you are at high risk. Talk with your health care provider about your test results, treatment options, and if necessary, the need for more tests. Vaccines  Your health care provider may recommend certain vaccines, such as:  Influenza vaccine. This  is recommended every year.  Tetanus, diphtheria, and acellular pertussis (Tdap, Td) vaccine. You may need a Td booster every 10 years.  Zoster vaccine. You may need this after age 35.  Pneumococcal 13-valent conjugate (PCV13) vaccine. One dose is recommended after age 64.  Pneumococcal polysaccharide (PPSV23) vaccine. One dose is recommended after age 32. Talk to your health care provider about which screenings and vaccines you need and how often you need them. This information is not intended to replace advice given to you by your health care provider. Make sure you discuss any questions you have with your health care provider. Document Released: 03/20/2015 Document Revised: 11/11/2015 Document Reviewed: 12/23/2014 Elsevier Interactive Patient Education  2017 [place] Prevention in the Home Falls can cause injuries. They can happen to people of all ages. There are many things you can do to make your home safe and to help prevent falls. What can I do on the outside of my home?  Regularly fix the edges of walkways and driveways and fix any cracks.  Remove anything that might make you trip as you walk through a door, such as a raised step or threshold.  Trim any bushes or trees on the path to your home.  Use bright outdoor lighting.  Clear any walking paths of anything that might make someone trip, such as rocks or tools.  Regularly check to see if handrails are loose or broken. Make sure that both sides of any steps have handrails.  Any raised decks and porches should have guardrails on the edges.  Have any leaves, snow, or ice cleared regularly.  Use sand or salt on walking paths during winter.  Clean up any spills in your garage right away. This includes oil or grease spills. What can I do in the bathroom?  Use night lights.  Install grab bars by the toilet and in the tub and shower. Do not use towel bars as grab bars.  Use non-skid mats or decals in the tub or  shower.  If you need to sit down in the shower, use a plastic, non-slip stool.  Keep the floor dry. Clean up any water that spills on the floor as soon as it happens.  Remove soap buildup in the tub or shower regularly.  Attach bath mats securely with double-sided non-slip rug tape.  Do not have throw rugs and other things on the floor that can make you trip. What can I do in the bedroom?  Use night lights.  Make sure that you have a light by your bed that is easy to reach.  Do not use any sheets or blankets that are too big for your bed. They should not hang down onto the floor.  Have a firm chair that has side arms. You can use this for support while you get dressed.  Do not have throw rugs and other things on the floor that can make you trip. What can I do in the kitchen?  Clean up any spills right away.  Avoid walking on wet floors.  Keep items that you use a lot in easy-to-reach places.  If you need to reach something above you, use a strong step stool that has a grab bar.  Keep  electrical cords out of the way.  Do not use floor polish or wax that makes floors slippery. If you must use wax, use non-skid floor wax.  Do not have throw rugs and other things on the floor that can make you trip. What can I do with my stairs?  Do not leave any items on the stairs.  Make sure that there are handrails on both sides of the stairs and use them. Fix handrails that are broken or loose. Make sure that handrails are as long as the stairways.  Check any carpeting to make sure that it is firmly attached to the stairs. Fix any carpet that is loose or worn.  Avoid having throw rugs at the top or bottom of the stairs. If you do have throw rugs, attach them to the floor with carpet tape.  Make sure that you have a light switch at the top of the stairs and the bottom of the stairs. If you do not have them, ask someone to add them for you. What else can I do to help prevent  falls?  Wear shoes that:  Do not have high heels.  Have rubber bottoms.  Are comfortable and fit you well.  Are closed at the toe. Do not wear sandals.  If you use a stepladder:  Make sure that it is fully opened. Do not climb a closed stepladder.  Make sure that both sides of the stepladder are locked into place.  Ask someone to hold it for you, if possible.  Clearly mark and make sure that you can see:  Any grab bars or handrails.  First and last steps.  Where the edge of each step is.  Use tools that help you move around (mobility aids) if they are needed. These include:  Canes.  Walkers.  Scooters.  Crutches.  Turn on the lights when you go into a dark area. Replace any light bulbs as soon as they burn out.  Set up your furniture so you have a clear path. Avoid moving your furniture around.  If any of your floors are uneven, fix them.  If there are any pets around you, be aware of where they are.  Review your medicines with your doctor. Some medicines can make you feel dizzy. This can increase your chance of falling. Ask your doctor what other things that you can do to help prevent falls. This information is not intended to replace advice given to you by your health care provider. Make sure you discuss any questions you have with your health care provider. Document Released: 12/18/2008 Document Revised: 07/30/2015 Document Reviewed: 03/28/2014 Elsevier Interactive Patient Education  2017 [employer].

## 2020-04-27 NOTE — Assessment & Plan Note (Signed)
Noted on recent labs Continue low carb diet Recheck A1c

## 2020-04-27 NOTE — Assessment & Plan Note (Signed)
Well controlled on last check - reviewed today Continue Statin

## 2020-04-27 NOTE — Assessment & Plan Note (Signed)
Saw urology Recheck PSA

## 2020-04-27 NOTE — Assessment & Plan Note (Signed)
Discussed importance of healthy weight management Discussed diet and exercise  

## 2020-04-27 NOTE — Assessment & Plan Note (Signed)
Well controlled Continue current medications Recheck metabolic panel F/u in 6 months  

## 2020-04-27 NOTE — Progress Notes (Signed)
Complete physical exam   Patient: Kevin Melton   DOB: [birth year]   70 y.o. Male  MRN: [id number] Visit Date: 04/27/2020  Today's healthcare provider: Lavon Paganini, MD   Chief Complaint  Patient presents with  . Annual Exam   Subjective    Kevin Melton is a 70 y.o. male who presents today for a complete physical exam.   He reports consuming a general diet. Home exercise routine includes walking. He generally feels well. He reports sleeping well. He does not have additional problems to discuss today.  HPI   Past Medical History:  Diagnosis Date  . Abdominal hernia    umbilical  . Chronic kidney disease (CKD), active medical management without dialysis   . Chronic kidney disease, stage III (moderate) (HCC)   . Coronary artery disease   . GERD (gastroesophageal reflux disease)   . History of kidney stones    h/o  . Hyperlipidemia   . Hypertension   . Kidney stones   . MI (myocardial infarction) [facility]) 2013   1 stent placed-sees Dr Estrella Myrtle) last seen in nov 2018   Past Surgical History:  Procedure Laterality Date  . COLONOSCOPY  2013  . CORONARY ANGIOPLASTY WITH STENT PLACEMENT  2013   [place]  . ESOPHAGOGASTRODUODENOSCOPY (EGD) WITH PROPOFOL N/A 01/05/2017   Procedure: ESOPHAGOGASTRODUODENOSCOPY (EGD) WITH PROPOFOL With Dilation;  Surgeon: Lin Landsman, MD;  Location: [facility] ENDOSCOPY;  Service: Gastroenterology;  Laterality: N/A;  . ESOPHAGOGASTRODUODENOSCOPY (EGD) WITH PROPOFOL N/A 07/03/2019   Procedure: ESOPHAGOGASTRODUODENOSCOPY (EGD) WITH PROPOFOL;  Surgeon: Lin Landsman, MD;  Location: [facility] ENDOSCOPY;  Service: Gastroenterology;  Laterality: N/A;  . ESOPHAGOGASTRODUODENOSCOPY (EGD) WITH PROPOFOL N/A 11/04/2019   Procedure: ESOPHAGOGASTRODUODENOSCOPY (EGD) WITH PROPOFOL;  Surgeon: Lin Landsman, MD;  Location: [facility] ENDOSCOPY;  Service: Gastroenterology;  Laterality: N/A;  . HERNIA REPAIR    . LITHOTRIPSY  2014  .  UMBILICAL HERNIA REPAIR N/A 02/20/2017   Primary repair 2 cm umbilical hernia;  Surgeon: Robert Bellow, MD;  Location: [facility] ORS;  Service: General;  Laterality: N/A;   Social History   Socioeconomic History  . Marital status: Married    Spouse name: [name]  . Number of children: 1  . Years of education: 54  . Highest education level: High school graduate  Occupational History  . Occupation: Self Employed    Comment: [employer]: semi-retired  Tobacco Use  . Smoking status: Never Smoker  . Smokeless tobacco: Never Used  Vaping Use  . Vaping Use: Never used  Substance and Sexual Activity  . Alcohol use: No  . Drug use: No  . Sexual activity: Yes    Partners: Female  Other Topics Concern  . Not on file  Social History Narrative  . Not on file   Social Determinants of Health   Financial Resource Strain: Low Risk   . Difficulty of Paying Living Expenses: Not hard at all  Food Insecurity: No Food Insecurity  . Worried About Charity fundraiser in the Last Year: Never true  . Ran Out of Food in the Last Year: Never true  Transportation Needs: No Transportation Needs  . Lack of Transportation (Medical): No  . Lack of Transportation (Non-Medical): No  Physical Activity: Insufficiently Active  . Days of Exercise per Week: 2 days  . Minutes of Exercise per Session: 60 min  Stress: No Stress Concern Present  . Feeling of Stress : Not at all  Social Connections: Moderately Isolated  . Frequency of Communication with Friends and Family: More than three times a week  . Frequency of Social Gatherings with Friends and Family: Three times a week  . Attends Religious Services: Never  . Active Member of Clubs or Organizations: No  . Attends Archivist Meetings: Never  . Marital Status: Married  Human resources officer Violence: Not At Risk  . Fear of Current or Ex-Partner: No  . Emotionally Abused: No  . Physically Abused: No  . Sexually  Abused: No   Family Status  Relation Name Status  . Mother  Deceased  . Father  Deceased  . Sister  Alive  . Brother  Alive  . Son  Alive  . Sister  Alive  . Neg Hx  (Not Specified)   Family History  Problem Relation Age of Onset  . Dementia Mother 14  . Lung disease Father 54       collapsed lung  . Healthy Sister   . Healthy Brother   . Heart Problems Brother   . Healthy Son   . Healthy Sister   . Colon cancer Neg Hx   . Prostate cancer Neg Hx   . Heart disease Neg Hx    No Known Allergies  Patient Care Team: Virginia Crews, MD as PCP - General (Family Medicine) End, Harrell Gave, MD as PCP - Cardiology (Cardiology)   Medications: Outpatient Medications Prior to Visit  Medication Sig  . allopurinol (ZYLOPRIM) 300 MG tablet Take 1 tablet (300 mg total) by mouth at bedtime.  Marland Kitchen amLODipine (NORVASC) 5 MG tablet TAKE 1 TABLET BY MOUTH EVERY DAY  . aspirin 81 MG chewable tablet Chew 81 mg by mouth at bedtime.   Marland Kitchen atorvastatin (LIPITOR) 40 MG tablet TAKE ONE TABLET BY MOUTH ONCE DAILY  . benazepril (LOTENSIN) 20 MG tablet TAKE 1 TABLET BY MOUTH EVERYDAY AT BEDTIME  . nitroGLYCERIN (NITROSTAT) 0.4 MG SL tablet Place 0.4 mg under the tongue every 5 (five) minutes as needed for chest pain.   Marland Kitchen triamcinolone ointment (KENALOG) 0.5 % Apply 1 application topically 2 (two) times daily.  . fluticasone (FLOVENT HFA) 220 MCG/ACT inhaler Take 2 puffs and swallow twice daily. **RINSE MOUTH AFTER EACH USE** **DON'T EAT or drink 30 MINS AFTER** (Patient not taking: No sig reported)  . omeprazole (PRILOSEC) 40 MG capsule Take 1 capsule (40 mg total) by mouth daily before breakfast. (Patient not taking: Reported on 04/27/2020)   No facility-administered medications prior to visit.    Review of Systems  All other systems reviewed and are negative.   Last CBC Lab Results  Component Value Date   WBC 7.6 02/12/2019   HGB 14.9 02/12/2019   HCT 42.9 02/12/2019   MCV 88 02/12/2019    MCH 30.4 02/12/2019   RDW 13.1 02/12/2019   PLT 245 80/99/8338   Last metabolic panel Lab Results  Component Value Date   GLUCOSE 98 10/29/2019   NA 142 10/29/2019   K 4.6 10/29/2019   CL 105 10/29/2019   CO2 23 10/29/2019   BUN 21 10/29/2019   CREATININE 1.18 10/29/2019   GFRNONAA 63 10/29/2019   GFRAA 73 10/29/2019   CALCIUM 9.1 10/29/2019   PHOS 3.1 12/13/2016   PROT 7.1 10/29/2019   ALBUMIN 4.3 10/29/2019   LABGLOB 2.8 10/29/2019   AGRATIO 1.5 10/29/2019   BILITOT 0.5 10/29/2019   ALKPHOS 78 10/29/2019   AST 13 10/29/2019   ALT 15 10/29/2019   Last lipids  Lab Results  Component Value Date   CHOL 117 10/29/2019   HDL 36 (L) 10/29/2019   LDLCALC 60 10/29/2019   TRIG 116 10/29/2019   CHOLHDL 3.3 10/29/2019   Last hemoglobin A1c Lab Results  Component Value Date   HGBA1C 6.4 (H) 10/29/2019   Last thyroid functions No results found for: TSH, T3TOTAL, T4TOTAL, THYROIDAB    Objective    BP (!) 128/56 (BP Location: Left Arm, Patient Position: Sitting, Cuff Size: Normal)   Pulse (!) 57   Temp 99 F (37.2 C) (Oral)   Resp 16   Ht 5\' 9"  (1.753 m)   Wt 196 lb (88.9 kg)   SpO2 97%   BMI 28.94 kg/m  BP Readings from Last 3 Encounters:  04/27/20 (!) 128/56  04/27/20 (!) 128/56  11/05/19 (!) 146/67   Wt Readings from Last 3 Encounters:  04/27/20 196 lb (88.9 kg)  04/27/20 196 lb 6.4 oz (89.1 kg)  11/05/19 205 lb (93 kg)      Physical Exam Vitals reviewed.  Constitutional:      General: He is not in acute distress.    Appearance: Normal appearance. He is well-developed. He is not diaphoretic.  HENT:     Head: Normocephalic and atraumatic.     Right Ear: Tympanic membrane, ear canal and external ear normal.     Left Ear: Tympanic membrane, ear canal and external ear normal.  Eyes:     General: No scleral icterus.    Conjunctiva/sclera: Conjunctivae normal.     Pupils: Pupils are equal, round, and reactive to light.  Neck:     Thyroid: No  thyromegaly.  Cardiovascular:     Rate and Rhythm: Normal rate and regular rhythm.     Pulses: Normal pulses.     Heart sounds: Normal heart sounds. No murmur heard.   Pulmonary:     Effort: Pulmonary effort is normal. No respiratory distress.     Breath sounds: Normal breath sounds. No wheezing or rales.  Abdominal:     General: There is no distension.     Palpations: Abdomen is soft.     Tenderness: There is no abdominal tenderness.  Musculoskeletal:        General: No deformity.     Cervical back: Neck supple.     Right lower leg: No edema.     Left lower leg: No edema.  Lymphadenopathy:     Cervical: No cervical adenopathy.  Skin:    General: Skin is warm and dry.     Findings: No rash.  Neurological:     Mental Status: He is alert and oriented to person, place, and time. Mental status is at baseline.     Gait: Gait normal.  Psychiatric:        Mood and Affect: Mood normal.        Behavior: Behavior normal.        Thought Content: Thought content normal.       Last depression screening scores PHQ 2/9 Scores 04/27/2020 10/29/2019 04/23/2019  PHQ - 2 Score 0 0 0  PHQ- 9 Score - 0 -   Last fall risk screening Fall Risk  04/27/2020  Falls in the past year? 0  Number falls in past yr: 0  Injury with Fall? 0   Last Audit-C alcohol use screening Alcohol Use Disorder Test (AUDIT) 04/27/2020  1. How often do you have a drink containing alcohol? 0  2. How many drinks containing alcohol do you have on  a typical day when you are drinking? 0  3. How often do you have six or more drinks on one occasion? 0  AUDIT-C Score 0  Alcohol Brief Interventions/Follow-up AUDIT Score <7 follow-up not indicated   A score of 3 or more in women, and 4 or more in men indicates increased risk for alcohol abuse, EXCEPT if all of the points are from question 1   No results found for any visits on 04/27/20.  Assessment & Plan    Routine Health Maintenance and Physical Exam  Exercise  Activities and Dietary recommendations Goals    . Exercise 3x per week (30 min per time)     Recommend to start fast walking three times a week for at least 30 minutes at a time.        Immunization History  Administered Date(s) Administered  . Fluad Quad(high Dose 65+) 10/26/2018  . Influenza, High Dose Seasonal PF 11/10/2016, 12/19/2017  . Moderna Sars-Covid-2 Vaccination 04/05/2019, 05/01/2019, 01/05/2020  . Pneumococcal Conjugate-13 11/10/2016  . Pneumococcal Polysaccharide-23 04/19/2018    Health Maintenance  Topic Date Due  . Fecal DNA (Cologuard)  04/06/2020  . INFLUENZA VACCINE  06/04/2020 (Originally 10/06/2019)  . TETANUS/TDAP  04/27/2021 (Originally 10/22/1969)  . COVID-19 Vaccine (4 - Booster for Moderna series) 07/04/2020  . Hepatitis C Screening  Completed  . PNA vac Low Risk Adult  Completed    Discussed health benefits of physical activity, and encouraged him to engage in regular exercise appropriate for his age and condition.  Problem List Items Addressed This Visit      Cardiovascular and Mediastinum   Essential hypertension with goal blood pressure less than 130/80    Well controlled Continue current medications Recheck metabolic panel F/u in 6 months       Relevant Orders   Basic Metabolic Panel (BMET)     Genitourinary   Chronic kidney disease, stage III (moderate) (HCC)    Continue to monitor Cr Avoid nephrotoxins      Relevant Orders   Basic Metabolic Panel (BMET)     Other   Mixed hyperlipidemia    Well controlled on last check - reviewed today Continue Statin      Prediabetes    Noted on recent labs Continue low carb diet Recheck A1c      Relevant Orders   Hemoglobin A1c   Elevated PSA    Saw urology Recheck PSA      Relevant Orders   PSA Total (Reflex To Free)   Overweight    Discussed importance of healthy weight management Discussed diet and exercise        Other Visit Diagnoses    Encounter for annual physical  exam    -  Primary   Relevant Orders   PSA Total (Reflex To Free)   Basic Metabolic Panel (BMET)   Hemoglobin A1c       Return in about 6 months (around 10/25/2020) for chronic disease f/u.     I, Lavon Paganini, MD, have reviewed all documentation for this visit. The documentation on 04/27/20 for the exam, diagnosis, procedures, and orders are all accurate and complete.   Jeyli Zwicker, Dionne Bucy, MD, MPH [facility]

## 2020-04-27 NOTE — Patient Instructions (Signed)
Preventive Care 65 Years and Older, Male Preventive care refers to lifestyle choices and visits with your health care provider that can promote health and wellness. This includes:  A yearly physical exam. This is also called an annual wellness visit.  Regular dental and eye exams.  Immunizations.  Screening for certain conditions.  Healthy lifestyle choices, such as: ? Eating a healthy diet. ? Getting regular exercise. ? Not using drugs or products that contain nicotine and tobacco. ? Limiting alcohol use. What can I expect for my preventive care visit? Physical exam Your health care provider will check your:  Height and weight. These may be used to calculate your BMI (body mass index). BMI is a measurement that tells if you are at a healthy weight.  Heart rate and blood pressure.  Body temperature.  Skin for abnormal spots. Counseling Your health care provider may ask you questions about your:  Past medical problems.  Family's medical history.  Alcohol, tobacco, and drug use.  Emotional well-being.  Home life and relationship well-being.  Sexual activity.  Diet, exercise, and sleep habits.  History of falls.  Memory and ability to understand (cognition).  Work and work environment.  Access to firearms. What immunizations do I need? Vaccines are usually given at various ages, according to a schedule. Your health care provider will recommend vaccines for you based on your age, medical history, and lifestyle or other factors, such as travel or where you work.   What tests do I need? Blood tests  Lipid and cholesterol levels. These may be checked every 5 years, or more often depending on your overall health.  Hepatitis C test.  Hepatitis B test. Screening  Lung cancer screening. You may have this screening every year starting at age 55 if you have a 30-pack-year history of smoking and currently smoke or have quit within the past 15 years.  Colorectal  cancer screening. ? All adults should have this screening starting at age 50 and continuing until age 75. ? Your health care provider may recommend screening at age 45 if you are at increased risk. ? You will have tests every 1-10 years, depending on your results and the type of screening test.  Prostate cancer screening. Recommendations will vary depending on your family history and other risks.  Genital exam to check for testicular cancer or hernias.  Diabetes screening. ? This is done by checking your blood sugar (glucose) after you have not eaten for a while (fasting). ? You may have this done every 1-3 years.  Abdominal aortic aneurysm (AAA) screening. You may need this if you are a current or former smoker.  STD (sexually transmitted disease) testing, if you are at risk. Follow these instructions at home: Eating and drinking  Eat a diet that includes fresh fruits and vegetables, whole grains, lean protein, and low-fat dairy products. Limit your intake of foods with high amounts of sugar, saturated fats, and salt.  Take vitamin and mineral supplements as recommended by your health care provider.  Do not drink alcohol if your health care provider tells you not to drink.  If you drink alcohol: ? Limit how much you have to 0-2 drinks a day. ? Be aware of how much alcohol is in your drink. In the [place] one drink equals one 12 oz bottle of beer (355 mL), one 5 oz glass of wine (148 mL), or one 1 oz glass of hard liquor (44 mL).   Lifestyle  Take daily care of your teeth   and gums. Brush your teeth every morning and night with fluoride toothpaste. Floss one time each day.  Stay active. Exercise for at least 30 minutes 5 or more days each week.  Do not use any products that contain nicotine or tobacco, such as cigarettes, e-cigarettes, and chewing tobacco. If you need help quitting, ask your health care provider.  Do not use drugs.  If you are sexually active, practice safe sex.  Use a condom or other form of protection to prevent STIs (sexually transmitted infections).  Talk with your health care provider about taking a low-dose aspirin or statin.  Find healthy ways to cope with stress, such as: ? Meditation, yoga, or listening to music. ? Journaling. ? Talking to a trusted person. ? Spending time with friends and family. Safety  Always wear your seat belt while driving or riding in a vehicle.  Do not drive: ? If you have been drinking alcohol. Do not ride with someone who has been drinking. ? When you are tired or distracted. ? While texting.  Wear a helmet and other protective equipment during sports activities.  If you have firearms in your house, make sure you follow all gun safety procedures. What's next?  Visit your health care provider once a year for an annual wellness visit.  Ask your health care provider how often you should have your eyes and teeth checked.  Stay up to date on all vaccines. This information is not intended to replace advice given to you by your health care provider. Make sure you discuss any questions you have with your health care provider. Document Revised: 11/20/2018 Document Reviewed: 02/15/2018 Elsevier Patient Education  2021 Elsevier Inc.  

## 2020-04-27 NOTE — Assessment & Plan Note (Signed)
Continue to monitor Cr Avoid nephrotoxins

## 2020-04-28 ENCOUNTER — Other Ambulatory Visit: Payer: Self-pay | Admitting: Family Medicine

## 2020-04-28 LAB — FPSA% REFLEX
% FREE PSA: 32.4 %
PSA, FREE: 1.36 ng/mL

## 2020-04-28 LAB — HEMOGLOBIN A1C
Est. average glucose Bld gHb Est-mCnc: 120 mg/dL
Hgb A1c MFr Bld: 5.8 % — ABNORMAL HIGH (ref 4.8–5.6)

## 2020-04-28 LAB — BASIC METABOLIC PANEL
BUN/Creatinine Ratio: 18 (ref 10–24)
BUN: 20 mg/dL (ref 8–27)
CO2: 20 mmol/L (ref 20–29)
Calcium: 9.3 mg/dL (ref 8.6–10.2)
Chloride: 104 mmol/L (ref 96–106)
Creatinine, Ser: 1.13 mg/dL (ref 0.76–1.27)
GFR calc Af Amer: 77 mL/min/{1.73_m2} (ref >59)
GFR calc non Af Amer: 66 mL/min/{1.73_m2} (ref >59)
Glucose: 96 mg/dL (ref 65–99)
Potassium: 4.5 mmol/L (ref 3.5–5.2)
Sodium: 138 mmol/L (ref 134–144)

## 2020-04-28 LAB — PSA TOTAL (REFLEX TO FREE): Prostate Specific Ag, Serum: 4.2 ng/mL — ABNORMAL HIGH (ref 0.0–4.0)

## 2020-04-30 ENCOUNTER — Ambulatory Visit: Payer: PPO | Admitting: Family Medicine

## 2020-05-01 ENCOUNTER — Other Ambulatory Visit: Payer: Self-pay | Admitting: Family Medicine

## 2020-05-10 ENCOUNTER — Encounter: Payer: Self-pay | Admitting: Urology

## 2020-05-11 DIAGNOSIS — Z1211 Encounter for screening for malignant neoplasm of colon: Secondary | ICD-10-CM | POA: Diagnosis not present

## 2020-05-19 LAB — COLOGUARD
COLOGUARD: NEGATIVE
Cologuard: NEGATIVE

## 2020-05-25 ENCOUNTER — Other Ambulatory Visit: Payer: Self-pay

## 2020-05-25 ENCOUNTER — Encounter: Payer: Self-pay | Admitting: Family Medicine

## 2020-05-25 ENCOUNTER — Ambulatory Visit (INDEPENDENT_AMBULATORY_CARE_PROVIDER_SITE_OTHER): Payer: PPO | Admitting: Family Medicine

## 2020-05-25 VITALS — BP 122/66 | HR 54 | Temp 98.0°F | Resp 16 | Wt 193.5 lb

## 2020-05-25 DIAGNOSIS — D229 Melanocytic nevi, unspecified: Secondary | ICD-10-CM | POA: Diagnosis not present

## 2020-05-25 DIAGNOSIS — L821 Other seborrheic keratosis: Secondary | ICD-10-CM

## 2020-05-25 NOTE — Progress Notes (Signed)
Established patient visit   Patient: Kevin Melton   DOB: [birth year]   70 y.o. Male  MRN: [id number] Visit Date: 05/25/2020  Today's healthcare provider: Lavon Paganini, MD   Chief Complaint  Patient presents with  . Rash   Subjective    HPI  Patient here today C/O mole on his back. Patient's wife reports that mole has been there for several years. She reports that the mole now has some black spots on the mole.   Patient Active Problem List   Diagnosis Date Noted  . Overweight 04/27/2020  . Elevated PSA 11/05/2019  . BPH without obstruction/lower urinary tract symptoms 11/05/2019  . Prediabetes 10/29/2019  . Dyshidrotic eczema 04/23/2019  . Eosinophilic esophagitis 17/40/8144  . Seborrheic keratoses 09/18/2017  . Chronic kidney disease, stage III (moderate) ([place]) 03/21/2017  . Coronary artery disease involving native coronary artery of native heart without angina pectoris 01/12/2017  . H/O acute myocardial infarction 11/10/2016  . Dysphagia 11/10/2016  . Umbilical hernia 81/85/6314  . Essential hypertension with goal blood pressure less than 130/80 09/04/2014  . Mixed hyperlipidemia 09/04/2014  . Gout 11/05/2013   Social History   Tobacco Use  . Smoking status: Never Smoker  . Smokeless tobacco: Never Used  Vaping Use  . Vaping Use: Never used  Substance Use Topics  . Alcohol use: No  . Drug use: No   No Known Allergies     Medications: Outpatient Medications Prior to Visit  Medication Sig  . allopurinol (ZYLOPRIM) 300 MG tablet TAKE 1 TABLET BY MOUTH EVERYDAY AT BEDTIME  . amLODipine (NORVASC) 5 MG tablet TAKE 1 TABLET BY MOUTH EVERY DAY  . aspirin 81 MG chewable tablet Chew 81 mg by mouth at bedtime.   Marland Kitchen atorvastatin (LIPITOR) 40 MG tablet TAKE ONE TABLET BY MOUTH ONCE DAILY  . benazepril (LOTENSIN) 20 MG tablet TAKE 1 TABLET BY MOUTH EVERYDAY AT BEDTIME  . nitroGLYCERIN (NITROSTAT) 0.4 MG SL tablet Place 0.4 mg under the tongue every 5  (five) minutes as needed for chest pain.   Marland Kitchen triamcinolone ointment (KENALOG) 0.5 % Apply 1 application topically 2 (two) times daily.  . fluticasone (FLOVENT HFA) 220 MCG/ACT inhaler Take 2 puffs and swallow twice daily. **RINSE MOUTH AFTER EACH USE** **DON'T EAT or drink 30 MINS AFTER** (Patient not taking: No sig reported)  . [DISCONTINUED] omeprazole (PRILOSEC) 40 MG capsule Take 1 capsule (40 mg total) by mouth daily before breakfast. (Patient not taking: Reported on 04/27/2020)   No facility-administered medications prior to visit.    Review of Systems  Constitutional: Negative for appetite change and fever.  Respiratory: Negative for chest tightness and shortness of breath.   Cardiovascular: Negative for chest pain and palpitations.  Skin: Positive for color change.    Last CBC Lab Results  Component Value Date   WBC 7.6 02/12/2019   HGB 14.9 02/12/2019   HCT 42.9 02/12/2019   MCV 88 02/12/2019   MCH 30.4 02/12/2019   RDW 13.1 02/12/2019   PLT 245 02/12/2019        Objective    BP 122/66 (BP Location: Left Arm, Patient Position: Sitting, Cuff Size: Normal)   Pulse (!) 54   Temp 98 F (36.7 C) (Oral)   Resp 16   Wt 193 lb 8 oz (87.8 kg)   SpO2 98%   BMI 28.57 kg/m  BP Readings from Last 3 Encounters:  05/25/20 122/66  04/27/20 (!) 128/56  04/27/20 (!) 128/56   [facility]  Readings from Last 3 Encounters:  05/25/20 193 lb 8 oz (87.8 kg)  04/27/20 196 lb (88.9 kg)  04/27/20 196 lb 6.4 oz (89.1 kg)      Physical Exam Constitutional:      General: He is not in acute distress.    Appearance: Normal appearance.  HENT:     Head: Normocephalic.  Cardiovascular:     Rate and Rhythm: Normal rate.  Pulmonary:     Effort: Pulmonary effort is normal. No respiratory distress.  Skin:    Comments: Multiple SKs and cherry angiomas over back.  One nevus is asymmetric with irregular borders and different colors within it.  Neurological:     Mental Status: He is alert.         No results found for any visits on 05/25/20.  Assessment & Plan     1. Atypical mole -Very small lesion on right sided thoracic back with irregular borders, irregular coloring, and asymmetry -I think that this needs to be looked at and possibly biopsy by dermatology, so referral was placed today - Ambulatory referral to Dermatology  2. Seborrheic keratosis -Reassured patient and his wife that the lesions that they are concerned about that are large on his back are SKs and benign in nature   No follow-ups on file.      I, Lavon Paganini, MD, have reviewed all documentation for this visit. The documentation on 05/25/20 for the exam, diagnosis, procedures, and orders are all accurate and complete.   Kazuto Sevey, Dionne Bucy, MD, MPH [facility]

## 2020-05-25 NOTE — Patient Instructions (Signed)
Seborrheic Keratosis A seborrheic keratosis is a common, noncancerous (benign) skin growth. These growths are velvety, waxy, rough, tan, brown, or black spots that appear on the skin. These skin growths can be flat or raised, and scaly. What are the causes? The cause of this condition is not known. What increases the risk? You are more likely to develop this condition if you:  Have a family history of seborrheic keratosis.  Are 50 or older.  Are pregnant.  Have had estrogen replacement therapy. What are the signs or symptoms? Symptoms of this condition include growths on the face, chest, shoulders, back, or other areas. These growths:  Are usually painless, but may become irritated and itchy.  Can be yellow, brown, black, or other colors.  Are slightly raised or have a flat surface.  Are sometimes rough or wart-like in texture.  Are often velvety or waxy on the surface.  Are round or oval-shaped.  Often occur in groups, but may occur as a single growth.   How is this diagnosed? This condition is diagnosed with a medical history and physical exam.  A sample of the growth may be tested (skin biopsy).  You may need to see a skin specialist (dermatologist). How is this treated? Treatment is not usually needed for this condition, unless the growths are irritated or bleed often.  You may also choose to have the growths removed if you do not like their appearance. ? Most commonly, these growths are treated with a procedure in which liquid nitrogen is applied to "freeze" off the growth (cryosurgery). ? They may also be burned off with electricity (electrocautery) or removed by scraping (curettage). Follow these instructions at home:  Watch your growth for any changes.  Keep all follow-up visits as told by your health care provider. This is important.  Do not scratch or pick at the growth or growths. This can cause them to become irritated or infected. Contact a health care  provider if:  You suddenly have many new growths.  Your growth bleeds, itches, or hurts.  Your growth suddenly becomes larger or changes color. Summary  A seborrheic keratosis is a common, noncancerous (benign) skin growth.  Treatment is not usually needed for this condition, unless the growths are irritated or bleed often.  Watch your growth for any changes.  Contact a health care provider if you suddenly have many new growths or your growth suddenly becomes larger or changes color.  Keep all follow-up visits as told by your health care provider. This is important. This information is not intended to replace advice given to you by your health care provider. Make sure you discuss any questions you have with your health care provider. Document Revised: 07/06/2017 Document Reviewed: 07/06/2017 Elsevier Patient Education  2021 Elsevier Inc.  

## 2020-07-17 ENCOUNTER — Encounter: Payer: Self-pay | Admitting: Family Medicine

## 2020-07-17 NOTE — Telephone Encounter (Signed)
See if patient wants referral for COVID treatment. He is in 5 day window

## 2020-07-23 ENCOUNTER — Other Ambulatory Visit: Payer: Self-pay | Admitting: Family Medicine

## 2020-07-29 ENCOUNTER — Other Ambulatory Visit: Payer: Self-pay | Admitting: Family Medicine

## 2020-07-29 NOTE — Telephone Encounter (Signed)
Requested Prescriptions  Pending Prescriptions Disp Refills  . atorvastatin (LIPITOR) 40 MG tablet [Pharmacy Med Name: ATORVASTATIN 40 MG TABLET] 90 tablet 2    Sig: TAKE ONE TABLET BY MOUTH ONCE DAILY     Cardiovascular:  Antilipid - Statins Failed - 07/29/2020  1:36 AM      Failed - HDL in normal range and within 360 days    HDL  Date Value Ref Range Status  10/29/2019 36 (L) >39 mg/dL Final         Passed - Total Cholesterol in normal range and within 360 days    Cholesterol, Total  Date Value Ref Range Status  10/29/2019 117 100 - 199 mg/dL Final         Passed - LDL in normal range and within 360 days    LDL Cholesterol (Calc)  Date Value Ref Range Status  11/10/2016 90 mg/dL (calc) Final    Comment:    Reference range: <100 . Desirable range <100 mg/dL for primary prevention;   <70 mg/dL for patients with CHD or diabetic patients  with > or = 2 CHD risk factors. Marland Kitchen LDL-C is now calculated using the Martin-Hopkins  calculation, which is a validated novel method providing  better accuracy than the Friedewald equation in the  estimation of LDL-C.  Cresenciano Genre et al. Annamaria Helling. 2706;237(62): 2061-2068  (http://education.QuestDiagnostics.com/faq/FAQ164)    LDL Chol Calc (NIH)  Date Value Ref Range Status  10/29/2019 60 0 - 99 mg/dL Final         Passed - Triglycerides in normal range and within 360 days    Triglycerides  Date Value Ref Range Status  10/29/2019 116 0 - 149 mg/dL Final         Passed - Patient is not pregnant      Passed - Valid encounter within last 12 months    Recent Outpatient Visits          2 months ago Atypical mole   [facility], Dionne Bucy, MD   3 months ago Encounter for annual physical exam   [facility], Dionne Bucy, MD   9 months ago Annual physical exam   [facility], Dionne Bucy, MD   1 year ago Essential hypertension with goal blood pressure less than 130/80    [facility], Dionne Bucy, MD   1 year ago Pain in lateral portion of left knee   [place], Dionne Bucy, MD      Future Appointments            In 2 months Stoioff, Ronda Fairly, MD [place]   In 3 months Bacigalupo, Dionne Bucy, MD [facility] [place]   In 9 months Bacigalupo, Dionne Bucy, MD [facility] [place]

## 2020-08-04 ENCOUNTER — Other Ambulatory Visit: Payer: Self-pay | Admitting: Family Medicine

## 2020-08-10 ENCOUNTER — Other Ambulatory Visit: Payer: Self-pay | Admitting: *Deleted

## 2020-08-10 DIAGNOSIS — R972 Elevated prostate specific antigen [PSA]: Secondary | ICD-10-CM

## 2020-09-24 ENCOUNTER — Telehealth: Payer: Self-pay | Admitting: *Deleted

## 2020-09-24 NOTE — Chronic Care Management (AMB) (Signed)
  Chronic Care Management   Note  09/24/2020 Name: Kevin Melton MRN: [id number] DOB: 1950-03-09  Kevin Melton is a 70 y.o. year old male who is a primary care patient of Brita Romp, Dionne Bucy, MD. I reached out to Brooks Sailors Ketron by phone today in response to a referral sent by Kevin Melton's PCP Brita Romp, Dionne Bucy, MD     Kevin Melton was given information about Chronic Care Management services today including:  CCM service includes personalized support from designated clinical staff supervised by his physician, including individualized plan of care and coordination with other care providers 24/7 contact phone numbers for assistance for urgent and routine care needs. Service will only be billed when office clinical staff spend 20 minutes or more in a month to coordinate care. Only one practitioner may furnish and bill the service in a calendar month. The patient may stop CCM services at any time (effective at the end of the month) by phone call to the office staff. The patient will be responsible for cost sharing (co-pay) of up to 20% of the service fee (after annual deductible is met).  Patient agreed to services and verbal consent obtained.   Follow up plan: Telephone appointment with care management team member scheduled for: 10/13/2020  Julian Hy, [place], [place] [facility]  Direct Dial: [phone]

## 2020-10-06 ENCOUNTER — Other Ambulatory Visit: Payer: PPO

## 2020-10-06 ENCOUNTER — Other Ambulatory Visit: Payer: Self-pay

## 2020-10-06 DIAGNOSIS — R972 Elevated prostate specific antigen [PSA]: Secondary | ICD-10-CM | POA: Diagnosis not present

## 2020-10-07 LAB — PSA: Prostate Specific Ag, Serum: 6.9 ng/mL — ABNORMAL HIGH (ref 0.0–4.0)

## 2020-10-12 ENCOUNTER — Encounter: Payer: Self-pay | Admitting: Urology

## 2020-10-12 ENCOUNTER — Other Ambulatory Visit: Payer: Self-pay

## 2020-10-12 ENCOUNTER — Ambulatory Visit: Payer: PPO | Admitting: Urology

## 2020-10-12 VITALS — BP 145/66 | HR 64 | Ht 69.0 in | Wt 195.0 lb

## 2020-10-12 DIAGNOSIS — R972 Elevated prostate specific antigen [PSA]: Secondary | ICD-10-CM | POA: Diagnosis not present

## 2020-10-12 NOTE — Progress Notes (Signed)
10/12/2020 1:23 PM   Kevin Melton [id number]  Referring provider: Virginia Crews, [place],  [place] [address]  Chief Complaint  Patient presents with   Elevated PSA    Urologic history: 1.  Elevated PSA Referred 11/05/2019 for PSA 4.7 Benign DRE Elected surveillance   HPI: 70 y.o. male presents for follow-up of an elevated PSA.  Follow-up PSA 04/27/2020 was 4.2 PSA 10/06/20 increased 6.9 No change in voiding pattern Denies dysuria, gross hematuria Denies flank, abdominal or pelvic pain   PMH: Past Medical History:  Diagnosis Date   Abdominal hernia    umbilical   Chronic kidney disease (CKD), active medical management without dialysis    Chronic kidney disease, stage III (moderate) (HCC)    Coronary artery disease    GERD (gastroesophageal reflux disease)    History of kidney stones    h/o   Hyperlipidemia    Hypertension    Kidney stones    MI (myocardial infarction) ([place]) 2013   1 stent placed-sees Dr End(cardiologist) last seen in nov 2018    Surgical History: Past Surgical History:  Procedure Laterality Date   COLONOSCOPY  2013   CORONARY ANGIOPLASTY WITH STENT PLACEMENT  2013   [place]   ESOPHAGOGASTRODUODENOSCOPY (EGD) WITH PROPOFOL N/A 01/05/2017   Procedure: ESOPHAGOGASTRODUODENOSCOPY (EGD) WITH PROPOFOL With Dilation;  Surgeon: Lin Landsman, MD;  Location: [place];  Service: Gastroenterology;  Laterality: N/A;   ESOPHAGOGASTRODUODENOSCOPY (EGD) WITH PROPOFOL N/A 07/03/2019   Procedure: ESOPHAGOGASTRODUODENOSCOPY (EGD) WITH PROPOFOL;  Surgeon: Lin Landsman, MD;  Location: [facility] ENDOSCOPY;  Service: Gastroenterology;  Laterality: N/A;   ESOPHAGOGASTRODUODENOSCOPY (EGD) WITH PROPOFOL N/A 11/04/2019   Procedure: ESOPHAGOGASTRODUODENOSCOPY (EGD) WITH PROPOFOL;  Surgeon: Lin Landsman, MD;  Location: [facility] ENDOSCOPY;  Service: Gastroenterology;  Laterality: N/A;   HERNIA  REPAIR     LITHOTRIPSY  [id number]   UMBILICAL HERNIA REPAIR N/A 02/20/2017   Primary repair 2 cm umbilical hernia;  Surgeon: Robert Bellow, MD;  Location: [facility] ORS;  Service: General;  Laterality: N/A;    Home Medications:  Allergies as of 10/12/2020   No Known Allergies      Medication List        Accurate as of October 12, 2020  1:23 PM. If you have any questions, ask your nurse or doctor.          allopurinol 300 MG tablet Commonly known as: ZYLOPRIM TAKE 1 TABLET BY MOUTH EVERYDAY AT BEDTIME   amLODipine 5 MG tablet Commonly known as: NORVASC TAKE 1 TABLET BY MOUTH EVERY DAY   aspirin 81 MG chewable tablet Chew 81 mg by mouth at bedtime.   atorvastatin 40 MG tablet Commonly known as: LIPITOR TAKE ONE TABLET BY MOUTH ONCE DAILY   benazepril 20 MG tablet Commonly known as: LOTENSIN TAKE 1 TABLET BY MOUTH EVERYDAY AT BEDTIME   fluticasone 220 MCG/ACT inhaler Commonly known as: FLOVENT HFA Take 2 puffs and swallow twice daily. **RINSE MOUTH AFTER EACH USE** **DON'T EAT or drink 30 MINS AFTER**   nitroGLYCERIN 0.4 MG SL tablet Commonly known as: NITROSTAT Place 0.4 mg under the tongue every 5 (five) minutes as needed for chest pain.   triamcinolone ointment 0.5 % Commonly known as: KENALOG Apply 1 application topically 2 (two) times daily.        Allergies: No Known Allergies  Family History: Family History  Problem Relation Age of Onset   Dementia Mother 73   Lung disease Father  40       collapsed lung   Healthy Sister    Healthy Brother    Heart Problems Brother    Healthy Son    Healthy Sister    Colon cancer Neg Hx    Prostate cancer Neg Hx    Heart disease Neg Hx     Social History:  reports that he has never smoked. He has never used smokeless tobacco. He reports that he does not drink alcohol and does not use drugs.   Physical Exam: BP (!) 145/66   Pulse 64   Ht '5\' 9"'$  (1.753 m)   Wt 195 lb (88.5 kg)   BMI 28.80 kg/m    Constitutional:  Alert and oriented, No acute distress. HEENT: [place] AT, moist mucus membranes.  Trachea midline, no masses. Cardiovascular: No clubbing, cyanosis, or edema. Respiratory: Normal respiratory effort, no increased work of breathing. GU: Prostate 35 g, smooth without nodules Skin: No rashes, bruises or suspicious lesions. Neurologic: Grossly intact, no focal deficits, moving all 4 extremities. Psychiatric: Normal mood and affect.   Assessment & Plan:    1.  Elevated PSA DRE stable and benign He inquired about transient increases/decreases in PSA levels and we discussed this is typically secondary to inflammation which does not cause symptoms We again reviewed options including continued surveillance, 4Kscore, prostate MRI and prostate biopsy He would like to continue surveillance and will schedule a lab visit for a repeat PSA in 4 months.  If PSA not back to baseline he will think over these options   Abbie Sons, MD  [facility] [address] [place], [place] [address] [phone]

## 2020-10-13 ENCOUNTER — Telehealth: Payer: Self-pay

## 2020-10-13 ENCOUNTER — Telehealth: Payer: PPO

## 2020-10-13 NOTE — Telephone Encounter (Signed)
  Care Management   Follow Up Note   10/13/2020 Name: Kevin Melton Krist MRN: [id number] DOB: 27-Mar-1950   Primary Care Provider: Virginia Crews, MD Reason for referral : Chronic Care Management   An unsuccessful telephone outreach was attempted today. The patient was referred to the case management team for assistance with care management and care coordination.    Follow Up Plan:  A HIPAA compliant voice message was left today requesting a return call.   Cristy [facility] Care Management [facility] [phone]

## 2020-10-14 ENCOUNTER — Ambulatory Visit: Payer: PPO | Admitting: Internal Medicine

## 2020-10-14 ENCOUNTER — Encounter: Payer: Self-pay | Admitting: Internal Medicine

## 2020-10-14 ENCOUNTER — Other Ambulatory Visit: Payer: Self-pay

## 2020-10-14 VITALS — BP 130/68 | HR 49 | Ht 69.0 in | Wt 200.0 lb

## 2020-10-14 DIAGNOSIS — R001 Bradycardia, unspecified: Secondary | ICD-10-CM | POA: Diagnosis not present

## 2020-10-14 DIAGNOSIS — I4510 Unspecified right bundle-branch block: Secondary | ICD-10-CM | POA: Insufficient documentation

## 2020-10-14 DIAGNOSIS — I2510 Atherosclerotic heart disease of native coronary artery without angina pectoris: Secondary | ICD-10-CM

## 2020-10-14 MED ORDER — NITROGLYCERIN 0.4 MG SL SUBL: 0.4000 mg | SUBLINGUAL_TABLET | SUBLINGUAL | 3 refills | Status: DC | PRN

## 2020-10-14 NOTE — Patient Instructions (Signed)
Medication Instructions:   Your physician recommends that you continue on your current medications as directed. Please refer to the Current Medication list given to you today.  We have sent a refill of your Nitroglycerin today.  *If you need a refill on your cardiac medications before your next appointment, please call your pharmacy*   Lab Work:  None ordered  Testing/Procedures:  Your physician has requested that you have an echocardiogram. Echocardiography is a painless test that uses sound waves to create images of your heart. It provides your doctor with information about the size and shape of your heart and how well your heart's chambers and valves are working. This procedure takes approximately one hour. There are no restrictions for this procedure.   Follow-Up: At [facility], you and your health needs are our priority.  As part of our continuing mission to provide you with exceptional heart care, we have created designated Provider Care Teams.  These Care Teams include your primary Cardiologist (physician) and Advanced Practice Providers (APPs -  Physician Assistants and Nurse Practitioners) who all work together to provide you with the care you need, when you need it.  We recommend signing up for the patient portal called "MyChart".  Sign up information is provided on this After Visit Summary.  MyChart is used to connect with patients for Virtual Visits (Telemedicine).  Patients are able to view lab/test results, encounter notes, upcoming appointments, etc.  Non-urgent messages can be sent to your provider as well.   To learn more about what you can do with MyChart, go to [email or web address].    Your next appointment:   1 year(s)  The format for your next appointment:   In Person  Provider:   You may see Nelva Bush, MD or one of the following Advanced Practice Providers on your designated Care Team:   Murray Hodgkins, NP Christell Faith, PA-C Marrianne Mood,  PA-C [place]ence [place], [place]

## 2020-10-14 NOTE — Progress Notes (Signed)
Follow-up Outpatient Visit Date: 10/14/2020  Primary Care Provider: Virginia Crews, MD [address] [place] [address]  Chief Complaint: Follow-up CAD  HPI:  Kevin Melton is a 70 y.o. male with history of coronary artery disease s/p BMS to RCA in 2013 in the setting of inferior MI, hypertension, chronic kidney disease stage III, and gout, who presents for follow-up of coronary artery disease.  He was last seen in our office in 02/2019 by Laurann Montana, NP, at which time he was feeling well.  No medication changes or additional testing were pursued.  Today, Mr. Chacko reports feeling well.  He denies chest pain, shortness of breath, palpitations, and edema.  He has noticed some brief orthostatic lightheadedness in the past but none recently.  He had lost some weight with diet and exercise but has not been exercising regularly recently and has gained some weight back.  He wishes to start exercising regularly again.  He has not had any chest pain or "cold sweats" reminiscent of his MI in 2013.  --------------------------------------------------------------------------------------------------  Cardiovascular History & Procedures: Cardiovascular Problems: Coronary artery disease status post PCI to RCA in the setting of inferior MI (2013)   Risk Factors: Known coronary artery disease, hypertension, male gender, and age greater than 76   Cath/PCI: LHC/PCI (2013, [place]): Details unknown.  Per report, bare-metal stent placed to RCA.   CV Surgery: None   EP Procedures and Devices: None   Non-Invasive Evaluation(s): Pharmacologic MPI (11/05/13): Small reversible apical anteroseptal defect suggestive of ischemia.  Normal wall motion with LVEF of 60%.  Recent CV Pertinent Labs: Lab Results  Component Value Date   CHOL 117 10/29/2019   HDL 36 (L) 10/29/2019   LDLCALC 60 10/29/2019   LDLCALC 90 11/10/2016   TRIG 116 10/29/2019   CHOLHDL 3.3 10/29/2019    CHOLHDL 3.6 01/31/2017   K 4.5 04/27/2020   BUN 20 04/27/2020   CREATININE 1.13 04/27/2020   CREATININE 1.36 (H) 12/13/2016    Past medical and surgical history were reviewed and updated in EPIC.  Current Meds  Medication Sig   allopurinol (ZYLOPRIM) 300 MG tablet TAKE 1 TABLET BY MOUTH EVERYDAY AT BEDTIME   amLODipine (NORVASC) 5 MG tablet TAKE 1 TABLET BY MOUTH EVERY DAY   aspirin 81 MG chewable tablet Chew 81 mg by mouth at bedtime.    atorvastatin (LIPITOR) 40 MG tablet TAKE ONE TABLET BY MOUTH ONCE DAILY   benazepril (LOTENSIN) 20 MG tablet TAKE 1 TABLET BY MOUTH EVERYDAY AT BEDTIME   nitroGLYCERIN (NITROSTAT) 0.4 MG SL tablet Place 0.4 mg under the tongue every 5 (five) minutes as needed for chest pain.     Allergies: Patient has no known allergies.  Social History   Tobacco Use   Smoking status: Never   Smokeless tobacco: Never  Vaping Use   Vaping Use: Never used  Substance Use Topics   Alcohol use: No   Drug use: No    Family History  Problem Relation Age of Onset   Dementia Mother 27   Lung disease Father 20       collapsed lung   Healthy Sister    Healthy Brother    Heart Problems Brother    Healthy Son    Healthy Sister    Colon cancer Neg Hx    Prostate cancer Neg Hx    Heart disease Neg Hx     Review of Systems: A 12-system review of systems was performed and was negative  except as noted in the HPI.  --------------------------------------------------------------------------------------------------  Physical Exam: BP 130/68 (BP Location: Left Arm, Patient Position: Sitting, Cuff Size: Normal)   Pulse (!) 49   Ht '5\' 9"'$  (1.753 m)   Wt 200 lb (90.7 kg)   SpO2 98%   BMI 29.53 kg/m   General:  NAD. Neck: No JVD or HJR. Lungs: Clear to auscultation bilaterally without wheezes or crackles. Heart: Regular rate and rhythm without murmurs, rubs, or gallops. Abdomen: Soft, nontender, nondistended. Extremities: No lower extremity edema.  EKG:   Sinus bradycardia with RBBB.  No significant change from prior tracing on 03/05/2019.  Lab Results  Component Value Date   WBC 7.6 02/12/2019   HGB 14.9 02/12/2019   HCT 42.9 02/12/2019   MCV 88 02/12/2019   PLT 245 02/12/2019    Lab Results  Component Value Date   NA 138 04/27/2020   K 4.5 04/27/2020   CL 104 04/27/2020   CO2 20 04/27/2020   BUN 20 04/27/2020   CREATININE 1.13 04/27/2020   GLUCOSE 96 04/27/2020   ALT 15 10/29/2019    Lab Results  Component Value Date   CHOL 117 10/29/2019   HDL 36 (L) 10/29/2019   LDLCALC 60 10/29/2019   TRIG 116 10/29/2019   CHOLHDL 3.3 10/29/2019    --------------------------------------------------------------------------------------------------  ASSESSMENT AND PLAN: Coronary artery disease, right bundle branch block, and bradycardia: Mr. Skeans does not report any anginal symptoms and would like to begin exercising again.  I think that is reasonable, though it is notable that his EKG has changed over the last few years, again showing a right bundle branch block today which was first observed at his last visit with [place] in 02/2019.  Given history of RCA disease, I have recommended that we obtain an echocardiogram.  If no significant structural abnormality is identified, I would recommend a functional study to ensure that he is not developing recurrent ischemia, particularly since his RCA disease was treated with a bare-metal stent 9 years ago.  In the meantime, we will continue his current medications for secondary prevention including low-dose aspirin and atorvastatin.  Hypertension: Blood pressure reasonable today.  Continue current doses of amlodipine and benazepril.  Hyperlipidemia: LDL and triglycerides well controlled on last check almost a year ago.  Mr. Bodle is scheduled for annual labs with his PCP later this month.  I will defer repeat labs to her with plans to continue atorvastatin 40 mg daily for target LDL less than  70.  Follow-up: Return to clinic in 1 year.  Nelva Bush, MD 10/14/2020 8:23 AM

## 2020-10-22 ENCOUNTER — Telehealth: Payer: Self-pay

## 2020-10-22 NOTE — Telephone Encounter (Signed)
  Care Management   Follow Up Note   10/22/2020 Name: Kevin Melton MRN: [id number] DOB: [birth year]   Primary Care Provider: Virginia Crews, MD Reason for referral : Chronic Care Management   An unsuccessful telephone outreach was attempted today. The patient was referred to the case management team for assistance with care management and care coordination.    Follow Up Plan:  A member of the care management team will attempt to reach [name] within the next two weeks.   [facility] Care Management [facility] [phone]

## 2020-10-27 ENCOUNTER — Other Ambulatory Visit: Payer: Self-pay | Admitting: Family Medicine

## 2020-10-29 ENCOUNTER — Encounter: Payer: Self-pay | Admitting: Family Medicine

## 2020-10-29 ENCOUNTER — Ambulatory Visit (INDEPENDENT_AMBULATORY_CARE_PROVIDER_SITE_OTHER): Payer: PPO | Admitting: Family Medicine

## 2020-10-29 ENCOUNTER — Other Ambulatory Visit: Payer: Self-pay

## 2020-10-29 VITALS — BP 144/70 | HR 49 | Temp 98.2°F | Ht 69.0 in | Wt 202.9 lb

## 2020-10-29 DIAGNOSIS — E782 Mixed hyperlipidemia: Secondary | ICD-10-CM | POA: Diagnosis not present

## 2020-10-29 DIAGNOSIS — E663 Overweight: Secondary | ICD-10-CM | POA: Diagnosis not present

## 2020-10-29 DIAGNOSIS — R7303 Prediabetes: Secondary | ICD-10-CM | POA: Diagnosis not present

## 2020-10-29 DIAGNOSIS — I2510 Atherosclerotic heart disease of native coronary artery without angina pectoris: Secondary | ICD-10-CM | POA: Diagnosis not present

## 2020-10-29 DIAGNOSIS — M25511 Pain in right shoulder: Secondary | ICD-10-CM

## 2020-10-29 DIAGNOSIS — I10 Essential (primary) hypertension: Secondary | ICD-10-CM | POA: Diagnosis not present

## 2020-10-29 DIAGNOSIS — N182 Chronic kidney disease, stage 2 (mild): Secondary | ICD-10-CM

## 2020-10-29 MED ORDER — BENAZEPRIL HCL 40 MG PO TABS: 40.0000 mg | ORAL_TABLET | Freq: Every day | ORAL | 1 refills | Status: DC

## 2020-10-29 NOTE — Assessment & Plan Note (Signed)
-   Chronic, previously stable, currently asymptomatic - Recheck A1c

## 2020-10-29 NOTE — Assessment & Plan Note (Signed)
-   Chronic, previously stable - Recheck CMP

## 2020-10-29 NOTE — Assessment & Plan Note (Addendum)
-   Chronic, sub-optimal control with systolic > [id number] in office today - Encouraged regular home BP monitoring - Discussed diet and exercise - Increase Benazepril to 40 mg daily; continue Amlodipine - Recheck CMP - Continue following with cardiology

## 2020-10-29 NOTE — Assessment & Plan Note (Signed)
-   Likely early R shoulder bursitis, unremarkable physical exam - Pt. declines ortho referral & declines steroid injections at this time - Recommended using tylenol & ibuprofen (sparingly due to CKD) prn for pain relief - Recommended pt. to continue water aerobics - Will continue to monitor - Instructed pt. to return to clinic if problem worsens or persists

## 2020-10-29 NOTE — Assessment & Plan Note (Signed)
-   Chronic, previously stable - Recheck lipids, CMP - Continue atorvastatin - Continue following with cardiology

## 2020-10-29 NOTE — Progress Notes (Signed)
Established patient visit   Patient: Kevin Melton   DOB: [id number]   70 y.o. Male  MRN: [id number] Visit Date: 10/29/2020  Today's healthcare provider: Lavon Paganini, MD   Chief Complaint  Patient presents with   Follow-up   Hypertension   Subjective    Hypertension - Currently taking Amlodipine, Benazepril - Denies chest pain, SOB, leg swelling, headaches, vision changes  Hyperlipidemia - Currently on atorvastatin, denies myalgias  Coronary Artery Disease - Followed by cardiology - Denies chest pain, SOB, leg swelling  Prediabetes - Previously stable, denies polyuria, denies polydipsia  Chronic Kidney Disease - Previously stable, stopped seeing nephrology 2 years ago   R shoulder pain  - Pt. reports dull, intermittent pain in R shoulder for the past month - Worsens while he's sleeping on R shoulder - Denies known injuries, denies functional changes      Medications: Outpatient Medications Prior to Visit  Medication Sig   allopurinol (ZYLOPRIM) 300 MG tablet TAKE 1 TABLET BY MOUTH EVERYDAY AT BEDTIME   amLODipine (NORVASC) 5 MG tablet TAKE 1 TABLET BY MOUTH EVERY DAY   aspirin 81 MG chewable tablet Chew 81 mg by mouth at bedtime.    atorvastatin (LIPITOR) 40 MG tablet TAKE ONE TABLET BY MOUTH ONCE DAILY   nitroGLYCERIN (NITROSTAT) 0.4 MG SL tablet Place 1 tablet (0.4 mg total) under the tongue every 5 (five) minutes as needed for chest pain.   [DISCONTINUED] benazepril (LOTENSIN) 20 MG tablet TAKE 1 TABLET BY MOUTH EVERYDAY AT BEDTIME   No facility-administered medications prior to visit.    Review of Systems  Constitutional:  Negative for activity change, chills, fatigue and fever.  HENT: Negative.    Eyes: Negative.   Respiratory: Negative.  Negative for chest tightness and shortness of breath.   Cardiovascular: Negative.  Negative for chest pain and leg swelling.  Gastrointestinal: Negative.   Endocrine: Negative.   Genitourinary:  Negative.   Musculoskeletal:  Positive for arthralgias. Negative for joint swelling.  Skin: Negative.   Neurological: Negative.       Objective    BP (!) 144/70 (BP Location: Right Arm, Patient Position: Sitting, Cuff Size: Normal)   Pulse (!) 49   Temp 98.2 F (36.8 C) (Oral)   Ht '5\' 9"'$  (1.753 m)   Wt 202 lb 14.4 oz (92 kg)   SpO2 98%   BMI 29.96 kg/m     Physical Exam Constitutional:      General: He is not in acute distress.    Appearance: Normal appearance.  HENT:     Head: Normocephalic and atraumatic.     Right Ear: External ear normal.     Left Ear: External ear normal.  Eyes:     Conjunctiva/sclera: Conjunctivae normal.  Cardiovascular:     Rate and Rhythm: Normal rate and regular rhythm.     Pulses: Normal pulses.     Heart sounds: Normal heart sounds.  Pulmonary:     Effort: Pulmonary effort is normal.     Breath sounds: Normal breath sounds.  Abdominal:     General: Abdomen is flat. Bowel sounds are normal.     Palpations: Abdomen is soft.  Musculoskeletal:        General: No swelling, tenderness, deformity or signs of injury.     Right shoulder: Normal. No swelling, deformity, effusion, tenderness, bony tenderness or crepitus. Normal range of motion. Normal strength. Normal pulse.     Left shoulder: Normal.  Skin:  General: Skin is warm and dry.  Neurological:     Mental Status: He is alert.  Psychiatric:        Behavior: Behavior normal.        Thought Content: Thought content normal.     No results found for any visits on 10/29/20.  Assessment & Plan     Problem List Items Addressed This Visit       Cardiovascular and Mediastinum   Essential hypertension with goal blood pressure less than 130/80 - Primary    - Chronic, sub-optimal control with systolic > [id number] in office today - Encouraged regular home BP monitoring - Discussed diet and exercise - Increase Benazepril to 40 mg daily; continue Amlodipine - Continue following with  cardiology      Relevant Medications   benazepril (LOTENSIN) 40 MG tablet   Other Relevant Orders   Comprehensive metabolic panel   Coronary artery disease involving native coronary artery of native heart without angina pectoris    - Chronic and stable - Continue following with cardiology - Continue medications as prescribed - Recheck lipids & A1c      Relevant Medications   benazepril (LOTENSIN) 40 MG tablet     Genitourinary   CKD (chronic kidney disease) stage 2, GFR 60-89 ml/min    - Chronic, previously stable - Recheck CMP      Relevant Orders   Comprehensive metabolic panel     Other   Mixed hyperlipidemia    - Chronic, previously stable - Recheck lipids, CMP - Continue atorvastatin - Continue following with cardiology      Relevant Medications   benazepril (LOTENSIN) 40 MG tablet   Other Relevant Orders   Lipid panel   Comprehensive metabolic panel   Prediabetes    - Chronic, previously stable, currently asymptomatic - Recheck A1c      Relevant Orders   Hemoglobin A1c   Overweight    - Discussed diet & exercise - Discussed importance of healthy weight management - Recheck lipids, A1c      Acute pain of right shoulder    - Likely early R shoulder bursitis, unremarkable physical exam - Pt. declines ortho referral & declines steroid injections at this time - Recommended using tylenol & ibuprofen (sparingly due to CKD) prn for pain relief - Recommended pt. to continue water aerobics - Will continue to monitor - Instructed pt. to return to clinic if problem worsens or persists        Return in about 2 months (around 12/29/2020) for BP f/u.      Percell Locus, MS3    Patient seen along with MS3 student Percell Locus. I personally evaluated this patient along with the student, and verified all aspects of the history, physical exam, and medical decision making as documented by the student. I agree with the student's documentation and have made  all necessary edits.  Nate Perri, Dionne Bucy, MD, MPH [facility]

## 2020-10-29 NOTE — Assessment & Plan Note (Signed)
-   Discussed diet & exercise - Discussed importance of healthy weight management - Recheck lipids, A1c

## 2020-10-29 NOTE — Assessment & Plan Note (Addendum)
-   Chronic and stable - Continue following with cardiology - Continue medications as prescribed - Recheck lipids & A1c

## 2020-10-30 LAB — COMPREHENSIVE METABOLIC PANEL
ALT: 18 IU/L (ref 0–44)
AST: 18 IU/L (ref 0–40)
Albumin/Globulin Ratio: 1.6 (ref 1.2–2.2)
Albumin: 4.2 g/dL (ref 3.8–4.8)
Alkaline Phosphatase: 68 IU/L (ref 44–121)
BUN/Creatinine Ratio: 14 (ref 10–24)
BUN: 15 mg/dL (ref 8–27)
Bilirubin Total: 0.4 mg/dL (ref 0.0–1.2)
CO2: 21 mmol/L (ref 20–29)
Calcium: 9 mg/dL (ref 8.6–10.2)
Chloride: 106 mmol/L (ref 96–106)
Creatinine, Ser: 1.11 mg/dL (ref 0.76–1.27)
Globulin, Total: 2.6 g/dL (ref 1.5–4.5)
Glucose: 105 mg/dL — ABNORMAL HIGH (ref 65–99)
Potassium: 4.8 mmol/L (ref 3.5–5.2)
Sodium: 142 mmol/L (ref 134–144)
Total Protein: 6.8 g/dL (ref 6.0–8.5)
eGFR: 72 mL/min/{1.73_m2} (ref >59)

## 2020-10-30 LAB — LIPID PANEL
Chol/HDL Ratio: 2.8 ratio (ref 0.0–5.0)
Cholesterol, Total: 109 mg/dL (ref 100–199)
HDL: 39 mg/dL — ABNORMAL LOW (ref >39)
LDL Chol Calc (NIH): 52 mg/dL (ref 0–99)
Triglycerides: 94 mg/dL (ref 0–149)
VLDL Cholesterol Cal: 18 mg/dL (ref 5–40)

## 2020-10-30 LAB — HEMOGLOBIN A1C
Est. average glucose Bld gHb Est-mCnc: 131 mg/dL
Hgb A1c MFr Bld: 6.2 % — ABNORMAL HIGH (ref 4.8–5.6)

## 2020-11-02 ENCOUNTER — Ambulatory Visit (INDEPENDENT_AMBULATORY_CARE_PROVIDER_SITE_OTHER): Payer: PPO

## 2020-11-02 DIAGNOSIS — I2510 Atherosclerotic heart disease of native coronary artery without angina pectoris: Secondary | ICD-10-CM | POA: Diagnosis not present

## 2020-11-02 NOTE — Chronic Care Management (AMB) (Signed)
  Chronic Care Management   CCM RN Visit Note  11/02/2020 Name: Kevin Melton MRN: [id number] DOB: 11-16-1950  Subjective: [name] is a 70 y.o. year old male who is a primary care patient of Bacigalupo, Dionne Bucy, MD. The care management team was consulted for assistance with disease management and care coordination needs.  His primary care provider will be notified of our unsuccessful attempts to establish contact. The care management team will gladly outreach at any time in the future if he is interested in receiving assistance.   PLAN The care management team will gladly follow up with Mr. Geers after the primary care provider has a conversation with him regarding recommendation for care management engagement and subsequent re-referral for care management services.     [facility] Care Management [facility] [phone]

## 2020-11-13 DIAGNOSIS — L821 Other seborrheic keratosis: Secondary | ICD-10-CM | POA: Diagnosis not present

## 2020-11-13 DIAGNOSIS — D485 Neoplasm of uncertain behavior of skin: Secondary | ICD-10-CM | POA: Diagnosis not present

## 2020-11-13 DIAGNOSIS — D2271 Melanocytic nevi of right lower limb, including hip: Secondary | ICD-10-CM | POA: Diagnosis not present

## 2020-11-13 DIAGNOSIS — D2261 Melanocytic nevi of right upper limb, including shoulder: Secondary | ICD-10-CM | POA: Diagnosis not present

## 2020-11-13 DIAGNOSIS — D2262 Melanocytic nevi of left upper limb, including shoulder: Secondary | ICD-10-CM | POA: Diagnosis not present

## 2020-11-13 DIAGNOSIS — D225 Melanocytic nevi of trunk: Secondary | ICD-10-CM | POA: Diagnosis not present

## 2020-11-13 DIAGNOSIS — C4361 Malignant melanoma of right upper limb, including shoulder: Secondary | ICD-10-CM | POA: Diagnosis not present

## 2020-11-13 DIAGNOSIS — D2272 Melanocytic nevi of left lower limb, including hip: Secondary | ICD-10-CM | POA: Diagnosis not present

## 2020-11-19 ENCOUNTER — Other Ambulatory Visit: Payer: Self-pay

## 2020-11-19 ENCOUNTER — Ambulatory Visit (INDEPENDENT_AMBULATORY_CARE_PROVIDER_SITE_OTHER): Payer: PPO

## 2020-11-19 DIAGNOSIS — I2510 Atherosclerotic heart disease of native coronary artery without angina pectoris: Secondary | ICD-10-CM | POA: Diagnosis not present

## 2020-11-19 DIAGNOSIS — I4510 Unspecified right bundle-branch block: Secondary | ICD-10-CM | POA: Diagnosis not present

## 2020-11-19 DIAGNOSIS — R001 Bradycardia, unspecified: Secondary | ICD-10-CM | POA: Diagnosis not present

## 2020-11-19 LAB — ECHOCARDIOGRAM COMPLETE
AR max vel: 2.93 cm2
AV Area VTI: 3.18 cm2
AV Area mean vel: 2.97 cm2
AV Mean grad: 6 mmHg
AV Peak grad: 10.9 mmHg
Ao pk vel: 1.65 m/s
Area-P 1/2: 2.66 cm2
Calc EF: 68.5 %
P 1/2 time: 1586 msec
S' Lateral: 2.6 cm
Single Plane A2C EF: 67 %
Single Plane A4C EF: 68.8 %

## 2020-11-23 ENCOUNTER — Telehealth: Payer: Self-pay | Admitting: *Deleted

## 2020-11-23 DIAGNOSIS — R9431 Abnormal electrocardiogram [ECG] [EKG]: Secondary | ICD-10-CM

## 2020-11-23 DIAGNOSIS — I252 Old myocardial infarction: Secondary | ICD-10-CM

## 2020-11-23 DIAGNOSIS — D2261 Melanocytic nevi of right upper limb, including shoulder: Secondary | ICD-10-CM | POA: Diagnosis not present

## 2020-11-23 DIAGNOSIS — C4361 Malignant melanoma of right upper limb, including shoulder: Secondary | ICD-10-CM | POA: Diagnosis not present

## 2020-11-23 NOTE — Telephone Encounter (Signed)
Please order

## 2020-11-23 NOTE — Telephone Encounter (Signed)
-----   Message from Nelva Bush, MD sent at 11/19/2020  3:02 PM EDT ----- Please let Kevin Melton know that his echocardiogram shows that his heart is contracting well.  There is mild leakage of the mitral and aortic valves, which we will continue to follow.  Given his history of prior heart attack, abnormal EKG, and desire to begin exercising, I recommend that we obtain an exercise myocardial perfusion stress test at his convenience.

## 2020-11-23 NOTE — Telephone Encounter (Signed)
Spoke with pt.  Notified of echo results and Dr. Darnelle Bos recc.  Pt agreeable to proceed with Exercise Myocardial Perfusion Stress Test.  Discussed with pt instructions below.  Notified pt he will receive a call from scheduling to set up date/time. Pt voiced understanding and has no further questions at this time.   [place]  Your caregiver has ordered a Stress Test with nuclear imaging. The purpose of this test is to evaluate the blood supply to your heart muscle. This procedure is referred to as a "Non-Invasive Stress Test." This is because other than having an IV started in your vein, nothing is inserted or "invades" your body. Cardiac stress tests are done to find areas of poor blood flow to the heart by determining the extent of coronary artery disease (CAD). Some patients exercise on a treadmill, which naturally increases the blood flow to your heart, while others who are  unable to walk on a treadmill due to physical limitations have a pharmacologic/chemical stress agent called Lexiscan . This medicine will mimic walking on a treadmill by temporarily increasing your coronary blood flow.   Please note: these test may take anywhere between 2-4 hours to complete  PLEASE REPORT TO [facility] AT THE FIRST DESK WILL DIRECT YOU WHERE TO GO  Date of Procedure:_____________________________________  Arrival Time for Procedure:______________________________  Instructions regarding medication:   ____ : Hold diabetes medication morning of procedure  ____:  Hold betablocker(s) night before procedure and morning of procedure  ____:  Hold other medications as follows:   PLEASE NOTIFY THE OFFICE AT LEAST 24 HOURS IN ADVANCE IF YOU ARE UNABLE TO KEEP YOUR APPOINTMENT.  [phone] AND  PLEASE NOTIFY NUCLEAR MEDICINE AT [facility] AT LEAST 24 HOURS IN ADVANCE IF YOU ARE UNABLE TO KEEP YOUR APPOINTMENT. [phone]  How to prepare for your Myoview test:  Do not eat  or drink after midnight No caffeine for 24 hours prior to test No smoking 24 hours prior to test. Your medication may be taken with water.  If your doctor stopped a medication because of this test, do not take that medication. Please wear a short sleeve shirt. No perfume, cologne or lotion. Wear comfortable walking shoes.

## 2020-11-23 NOTE — Telephone Encounter (Signed)
Apologies. Orders now placed.

## 2020-12-01 ENCOUNTER — Encounter
Admission: RE | Admit: 2020-12-01 | Discharge: 2020-12-01 | Disposition: A | Discharge status: Home / Self Care | Payer: PPO | Source: Ambulatory Visit | Attending: Internal Medicine | Admitting: Internal Medicine

## 2020-12-01 ENCOUNTER — Other Ambulatory Visit: Payer: Self-pay

## 2020-12-01 DIAGNOSIS — I252 Old myocardial infarction: Secondary | ICD-10-CM | POA: Insufficient documentation

## 2020-12-01 DIAGNOSIS — R9431 Abnormal electrocardiogram [ECG] [EKG]: Secondary | ICD-10-CM | POA: Diagnosis not present

## 2020-12-01 MED ORDER — TECHNETIUM TC 99M TETROFOSMIN IV KIT
10.4600 | PACK | Freq: Once | INTRAVENOUS | Status: AC | PRN
  Administered 2020-12-01: 10.46 via INTRAVENOUS

## 2020-12-01 MED ORDER — REGADENOSON 0.4 MG/5ML IV SOLN
0.4000 mg | Freq: Once | INTRAVENOUS | Status: AC
  Administered 2020-12-01: 0.4 mg via INTRAVENOUS

## 2020-12-01 MED ORDER — TECHNETIUM TC 99M TETROFOSMIN IV KIT
30.6700 | PACK | Freq: Once | INTRAVENOUS | Status: AC | PRN
  Administered 2020-12-01: 30.67 via INTRAVENOUS

## 2020-12-02 LAB — NM MYOCAR MULTI W/SPECT W/WALL MOTION / EF
LV dias vol: 82 mL (ref 62–150)
LV sys vol: 42 mL
Nuc Stress EF: 49 %
Peak HR: 68 {beats}/min
Percent HR: 45 %
Rest HR: 49 {beats}/min
Rest Nuclear Isotope Dose: 10.5 mCi
SDS: 1
SRS: 1
SSS: 0
ST Depression (mm): 0 mm
Stress Nuclear Isotope Dose: 30.7 mCi
TID: 1.29

## 2020-12-03 ENCOUNTER — Telehealth: Payer: Self-pay | Admitting: Internal Medicine

## 2020-12-03 DIAGNOSIS — K7689 Other specified diseases of liver: Secondary | ICD-10-CM

## 2020-12-03 NOTE — Telephone Encounter (Signed)
Nelva Bush, MD  12/02/2020 10:35 AM EDT     Please let Kevin Melton know that his stress test looks reassuring.  There is a subtle abnormality that may reflect scar related to his prior heart attack in 2013.  However, there is no evidence of a new blockage affecting the blood flow to his heart muscle.  Incidental note was made of a possible cyst in the liver.  I suggest that we obtain an abdominal ultrasound to confirm that it is a cyst.  I think it is fine for him to begin an exercise regimen.

## 2020-12-03 NOTE — Telephone Encounter (Signed)
I spoke with the patient regarding his stress test results and Dr. Darnelle Bos recommendations to obtain an abdominal ultrasound to look more specifically at a possible liver cyst noted on the images from his stress test.  The patient voices understanding and is agreeable.  He advised he is semi retired, so no real scheduling issues aside from a few other doctor's appointments.   I advised the patient we will call to schedule this test and give a call back with the details.  He was agreeable.    I called and spoke with the ultrasound department at [facility] to confirm the order for the abdominal ultrasound.  Per ultrsound- would schedule at a US abdomen right upper quadrant as this will give a full picture of the liver and is a less expensive test for the patient vs a complete abdominal US.  Transferred to scheduling: 1st available date is- Monday 12/14/20 at 8:45 am (arrive at 8:30 am) [facility]- NPO after midnight the night prior.  I have sent the details of this to the patient through his mychart and he has been notified by phone as well.  Message sent to precert.

## 2020-12-14 ENCOUNTER — Other Ambulatory Visit: Payer: Self-pay

## 2020-12-14 ENCOUNTER — Ambulatory Visit
Admission: RE | Admit: 2020-12-14 | Discharge: 2020-12-14 | Disposition: A | Discharge status: Home / Self Care | Payer: PPO | Source: Ambulatory Visit | Attending: Internal Medicine | Admitting: Internal Medicine

## 2020-12-14 DIAGNOSIS — K7689 Other specified diseases of liver: Secondary | ICD-10-CM | POA: Diagnosis not present

## 2020-12-29 ENCOUNTER — Ambulatory Visit: Payer: PPO | Admitting: Family Medicine

## 2021-01-23 ENCOUNTER — Other Ambulatory Visit: Payer: Self-pay | Admitting: Family Medicine

## 2021-01-23 NOTE — Telephone Encounter (Signed)
Requested medication (s) are due for refill today: yes  Requested medication (s) are on the active medication list: yes  Last refill:  10/27/20 #90  Future visit scheduled: yes  Notes to clinic:  pt canceled  BP recheck appt 12/29/20- please assess   Requested Prescriptions  Pending Prescriptions Disp Refills   amLODipine (NORVASC) 5 MG tablet [Pharmacy Med Name: AMLODIPINE BESYLATE 5 MG TAB] 90 tablet 0    Sig: TAKE 1 TABLET BY MOUTH EVERY DAY     Cardiovascular:  Calcium Channel Blockers Failed - 01/23/2021 10:01 AM      Failed - Last BP in normal range    BP Readings from Last 1 Encounters:  10/29/20 (!) 144/70          Passed - Valid encounter within last 6 months    Recent Outpatient Visits           2 months ago Essential hypertension with goal blood pressure less than 130/80   [facility], Dionne Bucy, MD   8 months ago Atypical mole   [facility], Dionne Bucy, MD   9 months ago Encounter for annual physical exam   [facility], Dionne Bucy, MD   1 year ago Annual physical exam   [facility], Dionne Bucy, MD   1 year ago Essential hypertension with goal blood pressure less than 130/80   [facility], Dionne Bucy, MD       Future Appointments             In 3 weeks Bacigalupo, Dionne Bucy, MD [facility], [place]   In 3 months Bacigalupo, Dionne Bucy, MD [facility], [place]

## 2021-02-12 ENCOUNTER — Other Ambulatory Visit: Payer: Self-pay

## 2021-02-12 DIAGNOSIS — R972 Elevated prostate specific antigen [PSA]: Secondary | ICD-10-CM

## 2021-02-15 ENCOUNTER — Other Ambulatory Visit: Payer: PPO

## 2021-02-15 ENCOUNTER — Other Ambulatory Visit: Payer: Self-pay

## 2021-02-15 DIAGNOSIS — R972 Elevated prostate specific antigen [PSA]: Secondary | ICD-10-CM | POA: Diagnosis not present

## 2021-02-16 ENCOUNTER — Telehealth: Payer: Self-pay | Admitting: Family Medicine

## 2021-02-16 ENCOUNTER — Encounter: Payer: Self-pay | Admitting: Family Medicine

## 2021-02-16 LAB — PSA: Prostate Specific Ag, Serum: 5.1 ng/mL — ABNORMAL HIGH (ref 0.0–4.0)

## 2021-02-16 NOTE — Telephone Encounter (Signed)
-----   Message from Abbie Sons, MD sent at 02/16/2021  2:44 PM EST ----- Repeat PSA is better at 5.1 but still elevated above baseline.  Recommend scheduling a prostate MRI.  If he is in agreement let me know and I will place the order

## 2021-02-16 NOTE — Telephone Encounter (Signed)
Patient notified and is willing to do the Prostate MRI. Instructions have been sent to his Mychart

## 2021-02-19 ENCOUNTER — Encounter: Payer: Self-pay | Admitting: Family Medicine

## 2021-02-19 ENCOUNTER — Other Ambulatory Visit: Payer: Self-pay

## 2021-02-19 ENCOUNTER — Ambulatory Visit (INDEPENDENT_AMBULATORY_CARE_PROVIDER_SITE_OTHER): Payer: PPO | Admitting: Family Medicine

## 2021-02-19 VITALS — BP 138/70 | HR 51 | Temp 98.4°F | Resp 16 | Ht 69.0 in | Wt 212.0 lb

## 2021-02-19 DIAGNOSIS — E782 Mixed hyperlipidemia: Secondary | ICD-10-CM

## 2021-02-19 DIAGNOSIS — Z23 Encounter for immunization: Secondary | ICD-10-CM | POA: Diagnosis not present

## 2021-02-19 DIAGNOSIS — K7689 Other specified diseases of liver: Secondary | ICD-10-CM | POA: Diagnosis not present

## 2021-02-19 DIAGNOSIS — I10 Essential (primary) hypertension: Secondary | ICD-10-CM

## 2021-02-19 MED ORDER — AMLODIPINE BESYLATE 10 MG PO TABS: 10.0000 mg | ORAL_TABLET | Freq: Every day | ORAL | 1 refills | Status: DC

## 2021-02-19 NOTE — Progress Notes (Signed)
Established patient visit   Patient: Kevin Melton   DOB: [birth year]   70 y.o. Male  MRN: [id number] Visit Date: 02/19/2021  Today's healthcare provider: Lavon Paganini, MD   Chief Complaint  Patient presents with   Follow-up    HTN and Cholesterol   Subjective    HPI HPI     Follow-up    Additional comments: HTN and Cholesterol      Last edited by Doristine Devoid, CMA on 02/19/2021 10:15 AM.      Hypertension, follow-up  BP Readings from Last 3 Encounters:  02/19/21 138/70  10/29/20 (!) 144/70  10/14/20 130/68   Wt Readings from Last 3 Encounters:  02/19/21 212 lb (96.2 kg)  10/29/20 202 lb 14.4 oz (92 kg)  10/14/20 200 lb (90.7 kg)     He was last seen for hypertension 3 months ago.  BP at that visit was 144/70. Management since that visit includes Encouraged regular home BP monitoring - Discussed diet and exercise - Increase Benazepril to 40 mg daily; continue Amlodipine.  He reports excellent compliance with treatment. He is not having side effects.  He is following a Diabetic diet. He is not exercising.  Outside blood pressures are not being checked regularly. Symptoms: No chest pain No chest pressure  No palpitations No syncope  No dyspnea No orthopnea  No paroxysmal nocturnal dyspnea No lower extremity edema   Pertinent labs: Lab Results  Component Value Date   CHOL 109 10/29/2020   HDL 39 (L) 10/29/2020   LDLCALC 52 10/29/2020   TRIG 94 10/29/2020   CHOLHDL 2.8 10/29/2020   Lab Results  Component Value Date   NA 142 10/29/2020   K 4.8 10/29/2020   CREATININE 1.11 10/29/2020   EGFR 72 10/29/2020   GLUCOSE 105 (H) 10/29/2020     The ASCVD Risk score (Arnett DK, et al., 2019) failed to calculate for the following reasons:   The patient has a prior MI or stroke diagnosis   ---------------------------------------------------------------------------------------------------  Lipid/Cholesterol, Follow-up  Last lipid  panel Other pertinent labs  Lab Results  Component Value Date   CHOL 109 10/29/2020   HDL 39 (L) 10/29/2020   LDLCALC 52 10/29/2020   TRIG 94 10/29/2020   CHOLHDL 2.8 10/29/2020   Lab Results  Component Value Date   ALT 18 10/29/2020   AST 18 10/29/2020   PLT 245 02/12/2019     He was last seen for this 3 months ago.  Management since that visit includes Continue atorvastatin.  He reports excellent compliance with treatment. He is not having side effects.   Symptoms: No chest pain No chest pressure/discomfort  No dyspnea No lower extremity edema  No numbness or tingling of extremity No orthopnea  No palpitations No paroxysmal nocturnal dyspnea  No speech difficulty No syncope    The ASCVD Risk score (Arnett DK, et al., 2019) failed to calculate for the following reasons:   The patient has a prior MI or stroke diagnosis  ---------------------------------------------------------------------------------------------------    Medications: Outpatient Medications Prior to Visit  Medication Sig   allopurinol (ZYLOPRIM) 300 MG tablet TAKE 1 TABLET BY MOUTH EVERYDAY AT BEDTIME   aspirin 81 MG chewable tablet Chew 81 mg by mouth at bedtime.    atorvastatin (LIPITOR) 40 MG tablet TAKE ONE TABLET BY MOUTH ONCE DAILY   benazepril (LOTENSIN) 40 MG tablet Take 1 tablet (40 mg total) by mouth daily.   nitroGLYCERIN (NITROSTAT) 0.4 MG SL tablet Place  1 tablet (0.4 mg total) under the tongue every 5 (five) minutes as needed for chest pain.   [DISCONTINUED] amLODipine (NORVASC) 5 MG tablet TAKE 1 TABLET BY MOUTH EVERY DAY   No facility-administered medications prior to visit.    Review of Systems per HPI     Objective    BP 138/70 (BP Location: Right Arm, Patient Position: Sitting, Cuff Size: Large) Comment: manual   Pulse (!) 51    Temp 98.4 F (36.9 C) (Oral)    Resp 16    Ht _0  (1.753 m)    Wt 212 lb (96.2 kg)    SpO2 96%    BMI 31.31 kg/m  {Show previous vital signs  (optional):23777}  Physical Exam Vitals reviewed.  Constitutional:      General: He is not in acute distress.    Appearance: Normal appearance. He is not diaphoretic.  HENT:     Head: Normocephalic and atraumatic.  Eyes:     General: No scleral icterus.    Conjunctiva/sclera: Conjunctivae normal.  Cardiovascular:     Rate and Rhythm: Normal rate and regular rhythm.     Pulses: Normal pulses.     Heart sounds: Normal heart sounds. No murmur heard. Pulmonary:     Effort: Pulmonary effort is normal. No respiratory distress.     Breath sounds: Normal breath sounds. No wheezing or rhonchi.  Abdominal:     General: There is no distension.     Palpations: Abdomen is soft.     Tenderness: There is no abdominal tenderness.  Musculoskeletal:     Cervical back: Neck supple.     Right lower leg: No edema.     Left lower leg: No edema.  Lymphadenopathy:     Cervical: No cervical adenopathy.  Skin:    General: Skin is warm and dry.     Capillary Refill: Capillary refill takes less than 2 seconds.     Findings: No rash.  Neurological:     Mental Status: He is alert and oriented to person, place, and time.     Cranial Nerves: No cranial nerve deficit.  Psychiatric:        Mood and Affect: Mood normal.        Behavior: Behavior normal.      No results found for any visits on 02/19/21.  Assessment & Plan     Problem List Items Addressed This Visit       Cardiovascular and Mediastinum   Essential hypertension with goal blood pressure less than 130/80 - Primary    Improving, but still above goal Continue benazepril at current dose  Increase amlodipine to 51m daily Recheck labs at next visit      Relevant Medications   amLODipine (NORVASC) 10 MG tablet     Digestive   Liver cyst    Less than 4cm in size and asymptomatic Does not need repeat imaging, but may feel better having repeat [place].        Other   Mixed hyperlipidemia    Previously well  controlled Continue statin Repeat FLP and CMP at CPE      Relevant Medications   amLODipine (NORVASC) 10 MG tablet   Other Visit Diagnoses     Need for influenza vaccination       Relevant Orders   Flu Vaccine QUAD High Dose(Fluad) (Completed)        Return in about 2 months (around 04/22/2021) for CPE, as scheduled.  I, Lavon Paganini, MD, have reviewed all documentation for this visit. The documentation on 02/19/21 for the exam, diagnosis, procedures, and orders are all accurate and complete.   Lavanda Nevels, Dionne Bucy, MD, MPH [facility]

## 2021-02-19 NOTE — Assessment & Plan Note (Signed)
Improving, but still above goal Continue benazepril at current dose  Increase amlodipine to 10mg  daily Recheck labs at next visit

## 2021-02-19 NOTE — Assessment & Plan Note (Signed)
Less than 4cm in size and asymptomatic Does not need repeat imaging, but may feel better having repeat US next year.

## 2021-02-19 NOTE — Assessment & Plan Note (Signed)
Previously well controlled Continue statin Repeat FLP and CMP at CPE   

## 2021-03-30 DIAGNOSIS — D2262 Melanocytic nevi of left upper limb, including shoulder: Secondary | ICD-10-CM | POA: Diagnosis not present

## 2021-03-30 DIAGNOSIS — D225 Melanocytic nevi of trunk: Secondary | ICD-10-CM | POA: Diagnosis not present

## 2021-03-30 DIAGNOSIS — Z85820 Personal history of malignant melanoma of skin: Secondary | ICD-10-CM | POA: Diagnosis not present

## 2021-03-30 DIAGNOSIS — D2261 Melanocytic nevi of right upper limb, including shoulder: Secondary | ICD-10-CM | POA: Diagnosis not present

## 2021-03-30 DIAGNOSIS — D2272 Melanocytic nevi of left lower limb, including hip: Secondary | ICD-10-CM | POA: Diagnosis not present

## 2021-04-18 ENCOUNTER — Other Ambulatory Visit: Payer: Self-pay | Admitting: Urology

## 2021-04-18 DIAGNOSIS — R972 Elevated prostate specific antigen [PSA]: Secondary | ICD-10-CM

## 2021-04-29 ENCOUNTER — Encounter: Payer: Self-pay | Admitting: Urology

## 2021-05-03 ENCOUNTER — Encounter: Payer: Self-pay | Admitting: Family Medicine

## 2021-05-03 ENCOUNTER — Ambulatory Visit (INDEPENDENT_AMBULATORY_CARE_PROVIDER_SITE_OTHER): Payer: PPO

## 2021-05-03 ENCOUNTER — Other Ambulatory Visit: Payer: Self-pay

## 2021-05-03 ENCOUNTER — Ambulatory Visit (INDEPENDENT_AMBULATORY_CARE_PROVIDER_SITE_OTHER): Payer: PPO | Admitting: Family Medicine

## 2021-05-03 VITALS — BP 128/68 | HR 59 | Temp 98.1°F | Resp 16 | Ht 69.0 in | Wt 207.0 lb

## 2021-05-03 VITALS — BP 156/72 | HR 59 | Temp 98.1°F | Ht 69.0 in | Wt 207.0 lb

## 2021-05-03 DIAGNOSIS — E663 Overweight: Secondary | ICD-10-CM | POA: Diagnosis not present

## 2021-05-03 DIAGNOSIS — I10 Essential (primary) hypertension: Secondary | ICD-10-CM

## 2021-05-03 DIAGNOSIS — K7689 Other specified diseases of liver: Secondary | ICD-10-CM

## 2021-05-03 DIAGNOSIS — N182 Chronic kidney disease, stage 2 (mild): Secondary | ICD-10-CM | POA: Diagnosis not present

## 2021-05-03 DIAGNOSIS — R7303 Prediabetes: Secondary | ICD-10-CM | POA: Diagnosis not present

## 2021-05-03 DIAGNOSIS — Z0000 Encounter for general adult medical examination without abnormal findings: Secondary | ICD-10-CM | POA: Diagnosis not present

## 2021-05-03 DIAGNOSIS — E782 Mixed hyperlipidemia: Secondary | ICD-10-CM

## 2021-05-03 NOTE — Assessment & Plan Note (Signed)
Recommend low carb diet °Recheck A1c  °

## 2021-05-03 NOTE — Assessment & Plan Note (Signed)
<  4cm in size and asymptomatic Repeat US in 10/23 and then no surveillance if not growing

## 2021-05-03 NOTE — Assessment & Plan Note (Signed)
Discussed importance of healthy weight management Discussed diet and exercise  

## 2021-05-03 NOTE — Assessment & Plan Note (Signed)
Well controlled Continue current medications Recheck metabolic panel F/u in 6 months  

## 2021-05-03 NOTE — Patient Instructions (Signed)
Kevin Melton , Thank you for taking time to come for your Medicare Wellness Visit. I appreciate your ongoing commitment to your health goals. Please review the following plan we discussed and let me know if I can assist you in the future.   Screening recommendations/referrals: Colonoscopy: Cologuard 05/27/20 Recommended yearly ophthalmology/optometry visit for glaucoma screening and checkup Recommended yearly dental visit for hygiene and checkup  Vaccinations: Influenza vaccine: 02/19/21 Pneumococcal vaccine: 04/19/18 Tdap vaccine: n/d Shingles vaccine: n/d   Covid-19: 04/05/19, 05/01/19, 01/05/20  Advanced directives: no  Conditions/risks identified: none  Next appointment: Follow up in one year for your annual wellness visit- 05/04/22 @ 9:40am in person  Preventive Care 60 Years and Older, Male Preventive care refers to lifestyle choices and visits with your health care provider that can promote health and wellness. What does preventive care include? A yearly physical exam. This is also called an annual well check. Dental exams once or twice a year. Routine eye exams. Ask your health care provider how often you should have your eyes checked. Personal lifestyle choices, including: Daily care of your teeth and gums. Regular physical activity. Eating a healthy diet. Avoiding tobacco and drug use. Limiting alcohol use. Practicing safe sex. Taking low doses of aspirin every day. Taking vitamin and mineral supplements as recommended by your health care provider. What happens during an annual well check? The services and screenings done by your health care provider during your annual well check will depend on your age, overall health, lifestyle risk factors, and family history of disease. Counseling  Your health care provider may ask you questions about your: Alcohol use. Tobacco use. Drug use. Emotional well-being. Home and relationship well-being. Sexual activity. Eating  habits. History of falls. Memory and ability to understand (cognition). Work and work Statistician. Screening  You may have the following tests or measurements: Height, weight, and BMI. Blood pressure. Lipid and cholesterol levels. These may be checked every 5 years, or more frequently if you are over 56 years old. Skin check. Lung cancer screening. You may have this screening every year starting at age 52 if you have a 30-pack-year history of smoking and currently smoke or have quit within the past 15 years. Fecal occult blood test (FOBT) of the stool. You may have this test every year starting at age 69. Flexible sigmoidoscopy or colonoscopy. You may have a sigmoidoscopy every 5 years or a colonoscopy every 10 years starting at age 76. Prostate cancer screening. Recommendations will vary depending on your family history and other risks. Hepatitis C blood test. Hepatitis B blood test. Sexually transmitted disease (STD) testing. Diabetes screening. This is done by checking your blood sugar (glucose) after you have not eaten for a while (fasting). You may have this done every 1-3 years. Abdominal aortic aneurysm (AAA) screening. You may need this if you are a current or former smoker. Osteoporosis. You may be screened starting at age 73 if you are at high risk. Talk with your health care provider about your test results, treatment options, and if necessary, the need for more tests. Vaccines  Your health care provider may recommend certain vaccines, such as: Influenza vaccine. This is recommended every year. Tetanus, diphtheria, and acellular pertussis (Tdap, Td) vaccine. You may need a Td booster every 10 years. Zoster vaccine. You may need this after age 45. Pneumococcal 13-valent conjugate (PCV13) vaccine. One dose is recommended after age 21. Pneumococcal polysaccharide (PPSV23) vaccine. One dose is recommended after age 46. Talk to your health care  provider about which screenings and  vaccines you need and how often you need them. This information is not intended to replace advice given to you by your health care provider. Make sure you discuss any questions you have with your health care provider. Document Released: 03/20/2015 Document Revised: 11/11/2015 Document Reviewed: 12/23/2014 Elsevier Interactive Patient Education  2017 [employer] Prevention in the Home Falls can cause injuries. They can happen to people of all ages. There are many things you can do to make your home safe and to help prevent falls. What can I do on the outside of my home? Regularly fix the edges of walkways and driveways and fix any cracks. Remove anything that might make you trip as you walk through a door, such as a raised step or threshold. Trim any bushes or trees on the path to your home. Use bright outdoor lighting. Clear any walking paths of anything that might make someone trip, such as rocks or tools. Regularly check to see if handrails are loose or broken. Make sure that both sides of any steps have handrails. Any raised decks and porches should have guardrails on the edges. Have any leaves, snow, or ice cleared regularly. Use sand or salt on walking paths during winter. Clean up any spills in your garage right away. This includes oil or grease spills. What can I do in the bathroom? Use night lights. Install grab bars by the toilet and in the tub and shower. Do not use towel bars as grab bars. Use non-skid mats or decals in the tub or shower. If you need to sit down in the shower, use a plastic, non-slip stool. Keep the floor dry. Clean up any water that spills on the floor as soon as it happens. Remove soap buildup in the tub or shower regularly. Attach bath mats securely with double-sided non-slip rug tape. Do not have throw rugs and other things on the floor that can make you trip. What can I do in the bedroom? Use night lights. Make sure that you have a light by your  bed that is easy to reach. Do not use any sheets or blankets that are too big for your bed. They should not hang down onto the floor. Have a firm chair that has side arms. You can use this for support while you get dressed. Do not have throw rugs and other things on the floor that can make you trip. What can I do in the kitchen? Clean up any spills right away. Avoid walking on wet floors. Keep items that you use a lot in easy-to-reach places. If you need to reach something above you, use a strong step stool that has a grab bar. Keep electrical cords out of the way. Do not use floor polish or wax that makes floors slippery. If you must use wax, use non-skid floor wax. Do not have throw rugs and other things on the floor that can make you trip. What can I do with my stairs? Do not leave any items on the stairs. Make sure that there are handrails on both sides of the stairs and use them. Fix handrails that are broken or loose. Make sure that handrails are as long as the stairways. Check any carpeting to make sure that it is firmly attached to the stairs. Fix any carpet that is loose or worn. Avoid having throw rugs at the top or bottom of the stairs. If you do have throw rugs, attach them to the  floor with carpet tape. Make sure that you have a light switch at the top of the stairs and the bottom of the stairs. If you do not have them, ask someone to add them for you. What else can I do to help prevent falls? Wear shoes that: Do not have high heels. Have rubber bottoms. Are comfortable and fit you well. Are closed at the toe. Do not wear sandals. If you use a stepladder: Make sure that it is fully opened. Do not climb a closed stepladder. Make sure that both sides of the stepladder are locked into place. Ask someone to hold it for you, if possible. Clearly mark and make sure that you can see: Any grab bars or handrails. First and last steps. Where the edge of each step is. Use tools that  help you move around (mobility aids) if they are needed. These include: Canes. Walkers. Scooters. Crutches. Turn on the lights when you go into a dark area. Replace any light bulbs as soon as they burn out. Set up your furniture so you have a clear path. Avoid moving your furniture around. If any of your floors are uneven, fix them. If there are any pets around you, be aware of where they are. Review your medicines with your doctor. Some medicines can make you feel dizzy. This can increase your chance of falling. Ask your doctor what other things that you can do to help prevent falls. This information is not intended to replace advice given to you by your health care provider. Make sure you discuss any questions you have with your health care provider. Document Released: 12/18/2008 Document Revised: 07/30/2015 Document Reviewed: 03/28/2014 Elsevier Interactive Patient Education  2017 [employer].

## 2021-05-03 NOTE — Progress Notes (Signed)
Subjective:   Kevin Melton is a 71 y.o. male who presents for Medicare Annual/Subsequent preventive examination.  Review of Systems           Objective:    Today's Vitals   05/03/21 0944  BP: (!) 156/72  Pulse: (!) 59  Temp: 98.1 F (36.7 C)  TempSrc: Oral  SpO2: 96%  Weight: 207 lb (93.9 kg)  Height: 5\' 9"  (1.753 m)   Body mass index is 30.57 kg/m.  Advanced Directives 04/27/2020 11/04/2019 07/03/2019 04/23/2019 04/19/2018 12/23/2017 02/20/2017  Does Patient Have a Medical Advance Directive? No No No No No No No  Would patient like information on creating a medical advance directive? No - Patient declined No - Patient declined No - Patient declined No - Patient declined No - Patient declined - No - Patient declined    Current Medications (verified) Outpatient Encounter Medications as of 05/03/2021  Medication Sig   allopurinol (ZYLOPRIM) 300 MG tablet TAKE 1 TABLET BY MOUTH EVERYDAY AT BEDTIME   amLODipine (NORVASC) 10 MG tablet Take 1 tablet (10 mg total) by mouth daily.   aspirin 81 MG chewable tablet Chew 81 mg by mouth at bedtime.    atorvastatin (LIPITOR) 40 MG tablet TAKE ONE TABLET BY MOUTH ONCE DAILY   benazepril (LOTENSIN) 40 MG tablet Take 1 tablet (40 mg total) by mouth daily.   nitroGLYCERIN (NITROSTAT) 0.4 MG SL tablet Place 1 tablet (0.4 mg total) under the tongue every 5 (five) minutes as needed for chest pain.   No facility-administered encounter medications on file as of 05/03/2021.    Allergies (verified) Patient has no known allergies.   History: Past Medical History:  Diagnosis Date   Abdominal hernia    umbilical   Chronic kidney disease (CKD), active medical management without dialysis    Chronic kidney disease, stage III (moderate) (HCC)    Coronary artery disease    GERD (gastroesophageal reflux disease)    History of kidney stones    h/o   Hyperlipidemia    Hypertension    Kidney stones    MI (myocardial infarction) ([place]) 2013    1 stent placed-sees Dr End(cardiologist) last seen in nov 2018   Past Surgical History:  Procedure Laterality Date   COLONOSCOPY  2013   CORONARY ANGIOPLASTY WITH STENT PLACEMENT  2013   [place]   ESOPHAGOGASTRODUODENOSCOPY (EGD) WITH PROPOFOL N/A 01/05/2017   Procedure: ESOPHAGOGASTRODUODENOSCOPY (EGD) WITH PROPOFOL With Dilation;  Surgeon: Lin Landsman, MD;  Location: [place];  Service: Gastroenterology;  Laterality: N/A;   ESOPHAGOGASTRODUODENOSCOPY (EGD) WITH PROPOFOL N/A 07/03/2019   Procedure: ESOPHAGOGASTRODUODENOSCOPY (EGD) WITH PROPOFOL;  Surgeon: Lin Landsman, MD;  Location: [facility] ENDOSCOPY;  Service: Gastroenterology;  Laterality: N/A;   ESOPHAGOGASTRODUODENOSCOPY (EGD) WITH PROPOFOL N/A 11/04/2019   Procedure: ESOPHAGOGASTRODUODENOSCOPY (EGD) WITH PROPOFOL;  Surgeon: Lin Landsman, MD;  Location: [facility] ENDOSCOPY;  Service: Gastroenterology;  Laterality: N/A;   HERNIA REPAIR     LITHOTRIPSY  [id number]   UMBILICAL HERNIA REPAIR N/A 02/20/2017   Primary repair 2 cm umbilical hernia;  Surgeon: Robert Bellow, MD;  Location: [facility];  Service: General;  Laterality: N/A;   Family History  Problem Relation Age of Onset   Dementia Mother 61   Lung disease Father 69       collapsed lung   Healthy Sister    Healthy Brother    Heart Problems Brother    Healthy Son    Healthy Sister    Colon cancer Neg  Hx    Prostate cancer Neg Hx    Heart disease Neg Hx    Social History   Socioeconomic History   Marital status: Married    Spouse name: [name]   Number of children: 1   Years of education: 12   Highest education level: High school graduate  Occupational History   Occupation: Self Employed    Comment: [employer]: semi-retired  Tobacco Use   Smoking status: Never   Smokeless tobacco: Never  Vaping Use   Vaping Use: Never used  Substance and Sexual Activity   Alcohol use: No   Drug use: No   Sexual activity:  Yes    Partners: Female  Other Topics Concern   Not on file  Social History Narrative   Not on file   Social Determinants of Health   Financial Resource Strain: Not on file  Food Insecurity: Not on file  Transportation Needs: Not on file  Physical Activity: Not on file  Stress: Not on file  Social Connections: Not on file    Tobacco Counseling Counseling given: Not Answered   Clinical Intake:  Pre-visit preparation completed: Yes  Pain : No/denies pain     Nutritional Risks: None Diabetes: No  How often do you need to have someone help you when you read instructions, pamphlets, or other written materials from your doctor or pharmacy?: 1 - Never  Diabetic?no  Interpreter Needed?: No  Information entered by :: Kirke Shaggy, LPN   Activities of Daily Living In your present state of health, do you have any difficulty performing the following activities: 02/19/2021 10/29/2020  Hearing? N N  Vision? N N  Difficulty concentrating or making decisions? N N  Walking or climbing stairs? N N  Dressing or bathing? N N  Doing errands, shopping? N N  Some recent data might be hidden    Patient Care Team: Virginia Crews, MD as PCP - General (Family Medicine) End, Harrell Gave, MD as PCP - Cardiology (Cardiology)  Indicate any recent Medical Services you may have received from other than [facility] providers in the past year (date may be approximate).     Assessment:   This is a routine wellness examination for [name].  Hearing/Vision screen No results found.  Dietary issues and exercise activities discussed:     Goals Addressed   None    Depression Screen PHQ 2/9 Scores 02/19/2021 10/29/2020 05/25/2020 04/27/2020 10/29/2019 04/23/2019 10/26/2018  PHQ - 2 Score 0 0 0 0 0 0 0  PHQ- 9 Score - 0 0 - 0 - 0    Fall Risk Fall Risk  02/19/2021 10/29/2020 04/27/2020 10/29/2019 10/29/2019  Falls in the past year? 0 0 0 0 0  Number falls in past yr: 0 0 0 0 0  Injury with  Fall? 0 0 0 0 0  Risk for fall due to : - No Fall Risks - - -    FALL RISK PREVENTION PERTAINING TO THE HOME:  Any stairs in or around the home? No  If so, are there any without handrails? No  Home free of loose throw rugs in walkways, pet beds, electrical cords, etc? Yes  Adequate lighting in your home to reduce risk of falls? Yes   ASSISTIVE DEVICES UTILIZED TO PREVENT FALLS:  Life alert? No  Use of a cane, walker or w/c? No  Grab bars in the bathroom? Yes  Shower chair or bench in shower? Yes  Elevated toilet  seat or a handicapped toilet? No   TIMED UP AND GO:  Was the test performed? Yes .  Length of time to ambulate 10 feet: 4 sec.   Gait steady and fast without use of assistive device  Cognitive Function:     6CIT Screen 04/19/2018  What Year? 0 points  What month? 0 points  What time? 0 points  Count back from 20 0 points  Months in reverse 0 points  Repeat phrase 0 points  Total Score 0    Immunizations Immunization History  Administered Date(s) Administered   Fluad Quad(high Dose 65+) 10/26/2018, 02/19/2021   Influenza, High Dose Seasonal PF 11/10/2016, 12/19/2017   Moderna Sars-Covid-2 Vaccination 04/05/2019, 05/01/2019, 01/05/2020   Pneumococcal Conjugate-13 11/10/2016   Pneumococcal Polysaccharide-23 04/19/2018    TDAP status: Due, Education has been provided regarding the importance of this vaccine. Advised may receive this vaccine at local pharmacy or Health Dept. Aware to provide a copy of the vaccination record if obtained from local pharmacy or Health Dept. Verbalized acceptance and understanding.  Flu Vaccine status: Up to date  Pneumococcal vaccine status: Up to date  Covid-19 vaccine status: Completed vaccines  Qualifies for Shingles Vaccine? No   Zostavax completed No   Shingrix Completed?: No.    Education has been provided regarding the importance of this vaccine. Patient has been advised to call insurance company to determine out of  pocket expense if they have not yet received this vaccine. Advised may also receive vaccine at local pharmacy or Health Dept. Verbalized acceptance and understanding.  Screening Tests Health Maintenance  Topic Date Due   TETANUS/TDAP  Never done   Zoster Vaccines- Shingrix (1 of 2) Never done   COVID-19 Vaccine (4 - Booster for Moderna series) 03/01/2020   Fecal DNA (Cologuard)  05/12/2023   Pneumonia Vaccine 28+ Years old  Completed   INFLUENZA VACCINE  Completed   Hepatitis C Screening  Completed   HPV VACCINES  Aged Out    Health Maintenance  Health Maintenance Due  Topic Date Due   TETANUS/TDAP  Never done   Zoster Vaccines- Shingrix (1 of 2) Never done   COVID-19 Vaccine (4 - Booster for Moderna series) 03/01/2020    Colorectal cancer screening: Type of screening: Cologuard. Completed 05/27/20. Repeat every 3 years  Lung Cancer Screening: (Low Dose CT Chest recommended if Age 57-80 years, 30 pack-year currently smoking OR have quit w/in 15years.) does not qualify.    Additional Screening:  Hepatitis C Screening: does qualify; Completed 11/10/16  Vision Screening: Recommended annual ophthalmology exams for early detection of glaucoma and other disorders of the eye. Is the patient up to date with their annual eye exam?  Yes  Who is the provider or what is the name of the office in which the patient attends annual eye exams? [facility] If pt is not established with a provider, would they like to be referred to a provider to establish care? No .   Dental Screening: Recommended annual dental exams for proper oral hygiene  Community Resource Referral / Chronic Care Management: CRR required this visit?  No   CCM required this visit?  No      Plan:     I have personally reviewed and noted the following in the patients chart:   Medical and social history Use of alcohol, tobacco or illicit drugs  Current medications and supplements including opioid prescriptions.  Patient is not currently taking opioid prescriptions. Functional ability and status Nutritional  status Physical activity Advanced directives List of other physicians Hospitalizations, surgeries, and ER visits in previous 12 months Vitals Screenings to include cognitive, depression, and falls Referrals and appointments  In addition, I have reviewed and discussed with patient certain preventive protocols, quality metrics, and best practice recommendations. A written personalized care plan for preventive services as well as general preventive health recommendations were provided to patient.     Dionisio David, LPN   [id number]   Nurse Notes: none

## 2021-05-03 NOTE — Assessment & Plan Note (Signed)
Previously well controlled Continue statin Repeat FLP and CMP annually 

## 2021-05-03 NOTE — Assessment & Plan Note (Signed)
Chronic and stable Recheck metabolic panel Avoid nephrotoxic meds  

## 2021-05-03 NOTE — Progress Notes (Signed)
I,Sulibeya S Dimas,acting as a Education administrator for Lavon Paganini, MD.,have documented all relevant documentation on the behalf of Lavon Paganini, MD,as directed by  Lavon Paganini, MD while in the presence of Lavon Paganini, MD.   Complete physical exam   Patient: Kevin Melton   DOB: [birth year]   71 y.o. Male  MRN: [id number] Visit Date: 05/03/2021  Today's healthcare provider: Lavon Paganini, MD   Chief Complaint  Patient presents with   Annual Exam   Subjective    Kevin Melton is a 71 y.o. male who presents today for a complete physical exam.  He reports consuming a general diet. Home exercise routine includes stretching. He generally feels well. He reports sleeping well. He does not have additional problems to discuss today.  HPI    Past Medical History:  Diagnosis Date   Abdominal hernia    umbilical   Chronic kidney disease (CKD), active medical management without dialysis    Chronic kidney disease, stage III (moderate) (HCC)    Coronary artery disease    GERD (gastroesophageal reflux disease)    History of kidney stones    h/o   Hyperlipidemia    Hypertension    Kidney stones    MI (myocardial infarction) ([place]) 2013   1 stent placed-sees Dr End(cardiologist) last seen in nov 2018   Past Surgical History:  Procedure Laterality Date   COLONOSCOPY  2013   CORONARY ANGIOPLASTY WITH STENT PLACEMENT  2013   [place]   ESOPHAGOGASTRODUODENOSCOPY (EGD) WITH PROPOFOL N/A 01/05/2017   Procedure: ESOPHAGOGASTRODUODENOSCOPY (EGD) WITH PROPOFOL With Dilation;  Surgeon: Lin Landsman, MD;  Location: [place];  Service: Gastroenterology;  Laterality: N/A;   ESOPHAGOGASTRODUODENOSCOPY (EGD) WITH PROPOFOL N/A 07/03/2019   Procedure: ESOPHAGOGASTRODUODENOSCOPY (EGD) WITH PROPOFOL;  Surgeon: Lin Landsman, MD;  Location: [facility] ENDOSCOPY;  Service: Gastroenterology;  Laterality: N/A;   ESOPHAGOGASTRODUODENOSCOPY (EGD) WITH PROPOFOL N/A  11/04/2019   Procedure: ESOPHAGOGASTRODUODENOSCOPY (EGD) WITH PROPOFOL;  Surgeon: Lin Landsman, MD;  Location: [facility] ENDOSCOPY;  Service: Gastroenterology;  Laterality: N/A;   HERNIA REPAIR     LITHOTRIPSY  [id number]   UMBILICAL HERNIA REPAIR N/A 02/20/2017   Primary repair 2 cm umbilical hernia;  Surgeon: Robert Bellow, MD;  Location: [facility] ORS;  Service: General;  Laterality: N/A;   Social History   Socioeconomic History   Marital status: Married    Spouse name: [name]   Number of children: 1   Years of education: 12   Highest education level: High school graduate  Occupational History   Occupation: Self Employed    Comment: [employer]: semi-retired  Tobacco Use   Smoking status: Never   Smokeless tobacco: Never  Vaping Use   Vaping Use: Never used  Substance and Sexual Activity   Alcohol use: No   Drug use: No   Sexual activity: Yes    Partners: Female  Other Topics Concern   Not on file  Social History Narrative   Not on file   Social Determinants of Health   Financial Resource Strain: Low Risk    Difficulty of Paying Living Expenses: Not hard at all  Food Insecurity: No Food Insecurity   Worried About Charity fundraiser in the Last Year: Never true   [place] in the Last Year: Never true  Transportation Needs: No Transportation Needs   Lack of Transportation (Medical): No   Lack of Transportation (Non-Medical): No  Physical Activity: Insufficiently  Active   Days of Exercise per Week: 2 days   Minutes of Exercise per Session: 60 min  Stress: No Stress Concern Present   Feeling of Stress : Not at all  Social Connections: Moderately Isolated   Frequency of Communication with Friends and Family: More than three times a week   Frequency of Social Gatherings with Friends and Family: Never   Attends Religious Services: Never   Marine scientist or Organizations: No   Attends Music therapist: Never    Marital Status: Married  Human resources officer Violence: Not At Risk   Fear of Current or Ex-Partner: No   Emotionally Abused: No   Physically Abused: No   Sexually Abused: No   Family Status  Relation Name Status   Mother  Deceased   Father  Deceased   Sister  Alive   Brother  Alive   Son  Alive   Sister  Alive   Neg Hx  (Not Specified)   Family History  Problem Relation Age of Onset   Dementia Mother 67   Lung disease Father 19       collapsed lung   Healthy Sister    Healthy Brother    Heart Problems Brother    Healthy Son    Healthy Sister    Colon cancer Neg Hx    Prostate cancer Neg Hx    Heart disease Neg Hx    No Known Allergies  Patient Care Team: Virginia Crews, MD as PCP - General (Family Medicine) End, Harrell Gave, MD as PCP - Cardiology (Cardiology)   Medications: Outpatient Medications Prior to Visit  Medication Sig   allopurinol (ZYLOPRIM) 300 MG tablet TAKE 1 TABLET BY MOUTH EVERYDAY AT BEDTIME   amLODipine (NORVASC) 10 MG tablet Take 1 tablet (10 mg total) by mouth daily.   aspirin 81 MG chewable tablet Chew 81 mg by mouth at bedtime.    atorvastatin (LIPITOR) 40 MG tablet TAKE ONE TABLET BY MOUTH ONCE DAILY   benazepril (LOTENSIN) 40 MG tablet Take 1 tablet (40 mg total) by mouth daily.   nitroGLYCERIN (NITROSTAT) 0.4 MG SL tablet Place 1 tablet (0.4 mg total) under the tongue every 5 (five) minutes as needed for chest pain.   No facility-administered medications prior to visit.    Review of Systems  All other systems reviewed and are negative.  Last CBC Lab Results  Component Value Date   WBC 7.6 02/12/2019   HGB 14.9 02/12/2019   HCT 42.9 02/12/2019   MCV 88 02/12/2019   MCH 30.4 02/12/2019   RDW 13.1 02/12/2019   PLT 245 46/50/3546   Last metabolic panel Lab Results  Component Value Date   GLUCOSE 105 (H) 10/29/2020   NA 142 10/29/2020   K 4.8 10/29/2020   CL 106 10/29/2020   CO2 21 10/29/2020   BUN 15 10/29/2020    CREATININE 1.11 10/29/2020   EGFR 72 10/29/2020   CALCIUM 9.0 10/29/2020   PHOS 3.1 12/13/2016   PROT 6.8 10/29/2020   ALBUMIN 4.2 10/29/2020   LABGLOB 2.6 10/29/2020   AGRATIO 1.6 10/29/2020   BILITOT 0.4 10/29/2020   ALKPHOS 68 10/29/2020   AST 18 10/29/2020   ALT 18 10/29/2020   Last lipids Lab Results  Component Value Date   CHOL 109 10/29/2020   HDL 39 (L) 10/29/2020   LDLCALC 52 10/29/2020   TRIG 94 10/29/2020   CHOLHDL 2.8 10/29/2020   Last hemoglobin A1c Lab Results  Component Value  Date   HGBA1C 6.2 (H) 10/29/2020       Objective    BP 128/68 (BP Location: Left Arm, Cuff Size: Large)    Pulse (!) 59    Temp 98.1 F (36.7 C) (Oral)    Resp 16    Ht '5\' 9"'  (1.753 m)    Wt 207 lb (93.9 kg)    SpO2 96%    BMI 30.57 kg/m  BP Readings from Last 3 Encounters:  05/03/21 128/68  05/03/21 (!) 156/72  02/19/21 138/70   Wt Readings from Last 3 Encounters:  05/03/21 207 lb (93.9 kg)  05/03/21 207 lb (93.9 kg)  02/19/21 212 lb (96.2 kg)       Physical Exam Vitals reviewed.  Constitutional:      General: He is not in acute distress.    Appearance: Normal appearance. He is not diaphoretic.  HENT:     Head: Normocephalic and atraumatic.  Eyes:     General: No scleral icterus.    Conjunctiva/sclera: Conjunctivae normal.  Cardiovascular:     Rate and Rhythm: Normal rate and regular rhythm.     Pulses: Normal pulses.     Heart sounds: Normal heart sounds. No murmur heard. Pulmonary:     Effort: Pulmonary effort is normal. No respiratory distress.     Breath sounds: Normal breath sounds. No wheezing or rhonchi.  Abdominal:     General: There is no distension.     Palpations: Abdomen is soft.     Tenderness: There is no abdominal tenderness.  Musculoskeletal:     Cervical back: Neck supple.     Right lower leg: No edema.     Left lower leg: No edema.  Lymphadenopathy:     Cervical: No cervical adenopathy.  Skin:    General: Skin is warm and dry.      Capillary Refill: Capillary refill takes less than 2 seconds.     Findings: No rash.  Neurological:     Mental Status: He is alert and oriented to person, place, and time.     Cranial Nerves: No cranial nerve deficit.  Psychiatric:        Mood and Affect: Mood normal.        Behavior: Behavior normal.      Last depression screening scores PHQ 2/9 Scores 05/03/2021 02/19/2021 10/29/2020  PHQ - 2 Score 0 0 0  PHQ- 9 Score - - 0   Last fall risk screening Fall Risk  05/03/2021  Falls in the past year? 0  Number falls in past yr: 0  Injury with Fall? 0  Risk for fall due to : No Fall Risks  Follow up Falls evaluation completed   Last Audit-C alcohol use screening Alcohol Use Disorder Test (AUDIT) 05/03/2021  1. How often do you have a drink containing alcohol? 0  2. How many drinks containing alcohol do you have on a typical day when you are drinking? 0  3. How often do you have six or more drinks on one occasion? 0  AUDIT-C Score 0  Alcohol Brief Interventions/Follow-up -   A score of 3 or more in women, and 4 or more in men indicates increased risk for alcohol abuse, EXCEPT if all of the points are from question 1   No results found for any visits on 05/03/21.  Assessment & Plan    Routine Health Maintenance and Physical Exam  Exercise Activities and Dietary recommendations  Goals      DIET - EAT  MORE FRUITS AND VEGETABLES     Exercise 3x per week (30 min per time)     Recommend to start fast walking three times a week for at least 30 minutes at a time.         Immunization History  Administered Date(s) Administered   Fluad Quad(high Dose 65+) 10/26/2018, 02/19/2021   Influenza, High Dose Seasonal PF 11/10/2016, 12/19/2017   Moderna Sars-Covid-2 Vaccination 04/05/2019, 05/01/2019, 01/05/2020   Pneumococcal Conjugate-13 11/10/2016   Pneumococcal Polysaccharide-23 04/19/2018    Health Maintenance  Topic Date Due   TETANUS/TDAP  Never done   Zoster Vaccines-  Shingrix (1 of 2) Never done   COVID-19 Vaccine (4 - Booster for Moderna series) 03/01/2020   Fecal DNA (Cologuard)  05/12/2023   Pneumonia Vaccine 47+ Years old  Completed   INFLUENZA VACCINE  Completed   Hepatitis C Screening  Completed   HPV VACCINES  Aged Out    Discussed health benefits of physical activity, and encouraged him to engage in regular exercise appropriate for his age and condition.  Problem List Items Addressed This Visit       Cardiovascular and Mediastinum   Essential hypertension with goal blood pressure less than 130/80    Well controlled Continue current medications Recheck metabolic panel F/u in 6 months       Relevant Orders   Basic Metabolic Panel (BMET)     Digestive   Liver cyst    <4cm in size and asymptomatic Repeat US in 10/23 and then no surveillance if not growing        Genitourinary   CKD (chronic kidney disease) stage 2, GFR 60-89 ml/min    Chronic and stable Recheck metabolic panel Avoid nephrotoxic meds       Relevant Orders   Basic Metabolic Panel (BMET)     Other   Mixed hyperlipidemia    Previously well controlled Continue statin Repeat FLP and CMP annually      Prediabetes    Recommend low carb diet Recheck A1c      Relevant Orders   Hemoglobin A1c   Overweight    Discussed importance of healthy weight management Discussed diet and exercise       Other Visit Diagnoses     Annual physical exam    -  Primary   Relevant Orders   Hemoglobin J5T   Basic Metabolic Panel (BMET)        Return in about 6 months (around 10/31/2021) for chronic disease f/u.     I, Lavon Paganini, MD, have reviewed all documentation for this visit. The documentation on 05/03/21 for the exam, diagnosis, procedures, and orders are all accurate and complete.   Elli Groesbeck, Dionne Bucy, MD, MPH [facility]

## 2021-05-04 LAB — BASIC METABOLIC PANEL
BUN/Creatinine Ratio: 18 (ref 10–24)
BUN: 21 mg/dL (ref 8–27)
CO2: 24 mmol/L (ref 20–29)
Calcium: 9.2 mg/dL (ref 8.6–10.2)
Chloride: 102 mmol/L (ref 96–106)
Creatinine, Ser: 1.18 mg/dL (ref 0.76–1.27)
Glucose: 100 mg/dL — ABNORMAL HIGH (ref 70–99)
Potassium: 4.9 mmol/L (ref 3.5–5.2)
Sodium: 141 mmol/L (ref 134–144)
eGFR: 67 mL/min/{1.73_m2} (ref >59)

## 2021-05-04 LAB — HEMOGLOBIN A1C
Est. average glucose Bld gHb Est-mCnc: 131 mg/dL
Hgb A1c MFr Bld: 6.2 % — ABNORMAL HIGH (ref 4.8–5.6)

## 2021-05-12 ENCOUNTER — Other Ambulatory Visit: Payer: Self-pay | Admitting: Family Medicine

## 2021-05-12 ENCOUNTER — Ambulatory Visit
Admission: RE | Admit: 2021-05-12 | Discharge: 2021-05-12 | Disposition: A | Discharge status: Home / Self Care | Payer: PPO | Source: Ambulatory Visit | Attending: Urology | Admitting: Urology

## 2021-05-12 ENCOUNTER — Other Ambulatory Visit: Payer: Self-pay

## 2021-05-12 DIAGNOSIS — R972 Elevated prostate specific antigen [PSA]: Secondary | ICD-10-CM | POA: Insufficient documentation

## 2021-05-12 DIAGNOSIS — N3289 Other specified disorders of bladder: Secondary | ICD-10-CM | POA: Diagnosis not present

## 2021-05-12 DIAGNOSIS — K5790 Diverticulosis of intestine, part unspecified, without perforation or abscess without bleeding: Secondary | ICD-10-CM | POA: Diagnosis not present

## 2021-05-12 MED ORDER — GADOBUTROL 1 MMOL/ML IV SOLN
9.0000 mL | Freq: Once | INTRAVENOUS | Status: AC | PRN
  Administered 2021-05-12: 9 mL via INTRAVENOUS

## 2021-05-13 NOTE — Telephone Encounter (Signed)
Requested medication (s) are due for refill today: yes ? ?Requested medication (s) are on the active medication list: yes ? ?Last refill:  10/27/20 #90/1 ? ?Future visit scheduled: yes ? ?Notes to clinic:  Unable to refill per protocol due to failed labs, no updated results. ? ? ? ?  ?Requested Prescriptions  ?Pending Prescriptions Disp Refills  ? allopurinol (ZYLOPRIM) 300 MG tablet [Pharmacy Med Name: ALLOPURINOL 300 MG TABLET] 90 tablet 1  ?  Sig: TAKE 1 TABLET BY MOUTH EVERYDAY AT BEDTIME  ?  ? Endocrinology:  Gout Agents - allopurinol Failed - 05/12/2021  1:07 PM  ?  ?  Failed - Uric Acid in normal range and within 360 days  ?  Uric Acid  ?Date Value Ref Range Status  ?04/23/2019 4.2 3.8 - 8.4 mg/dL Final  ?  Comment:  ?             Therapeutic target for gout patients: <6.0  ?  ?  ?  ?  Failed - CBC within normal limits and completed in the last 12 months  ?  WBC  ?Date Value Ref Range Status  ?02/12/2019 7.6 3.4 - 10.8 x10E3/uL Final  ?11/10/2016 9.6 3.8 - 10.8 Thousand/uL Final  ? ?RBC  ?Date Value Ref Range Status  ?02/12/2019 4.90 4.14 - 5.80 x10E6/uL Final  ?11/10/2016 5.39 4.20 - 5.80 Million/uL Final  ? ?Hemoglobin  ?Date Value Ref Range Status  ?02/12/2019 14.9 13.0 - 17.7 g/dL Final  ? ?Hematocrit  ?Date Value Ref Range Status  ?02/12/2019 42.9 37.5 - 51.0 % Final  ? ?MCHC  ?Date Value Ref Range Status  ?02/12/2019 34.7 31.5 - 35.7 g/dL Final  ?11/10/2016 34.2 32.0 - 36.0 g/dL Final  ? ?MCH  ?Date Value Ref Range Status  ?02/12/2019 30.4 26.6 - 33.0 pg Final  ?11/10/2016 30.1 27.0 - 33.0 pg Final  ? ?MCV  ?Date Value Ref Range Status  ?02/12/2019 88 79 - 97 fL Final  ? ?No results found for: PLTCOUNTKUC, LABPLAT, [place] ?RDW  ?Date Value Ref Range Status  ?02/12/2019 13.1 11.6 - 15.4 % Final  ? ?  ?  ?  Passed - Cr in normal range and within 360 days  ?  Creat  ?Date Value Ref Range Status  ?12/13/2016 1.36 (H) 0.70 - 1.25 mg/dL Final  ?  Comment:  ?  For patients >34 years of age, the reference  limit ?for Creatinine is approximately 13% higher for people ?identified as African-American. ?. ?  ? ?Creatinine, Ser  ?Date Value Ref Range Status  ?05/03/2021 1.18 0.76 - 1.27 mg/dL Final  ?  ?  ?  ?  Passed - Valid encounter within last 12 months  ?  Recent Outpatient Visits   ? ?      ? 1 week ago Annual physical exam  ? [facility] [place], Dionne Bucy, MD  ? 2 months ago Essential hypertension with goal blood pressure less than 130/80  ? [facility] [place], Dionne Bucy, MD  ? 6 months ago Essential hypertension with goal blood pressure less than 130/80  ? [facility] [place], Dionne Bucy, MD  ? 11 months ago Atypical mole  ? [facility] [place], Dionne Bucy, MD  ? 1 year ago Encounter for annual physical exam  ? [facility], Dionne Bucy, MD  ? ?  ?  ?Future Appointments   ? ?        ? In 5 months [place],  Dionne Bucy, MD [facility]  ? ?  ? ?  ?  ?  ?Signed Prescriptions Disp Refills  ? benazepril (LOTENSIN) 40 MG tablet 90 tablet 1  ?  Sig: TAKE 1 TABLET BY MOUTH EVERY DAY  ?  ? Cardiovascular:  ACE Inhibitors Passed - 05/12/2021  1:07 PM  ?  ?  Passed - Cr in normal range and within 180 days  ?  Creat  ?Date Value Ref Range Status  ?12/13/2016 1.36 (H) 0.70 - 1.25 mg/dL Final  ?  Comment:  ?  For patients >77 years of age, the reference limit ?for Creatinine is approximately 13% higher for people ?identified as African-American. ?. ?  ? ?Creatinine, Ser  ?Date Value Ref Range Status  ?05/03/2021 1.18 0.76 - 1.27 mg/dL Final  ?  ?  ?  ?  Passed - K in normal range and within 180 days  ?  Potassium  ?Date Value Ref Range Status  ?05/03/2021 4.9 3.5 - 5.2 mmol/L Final  ?  ?  ?  ?  Passed - Patient is not pregnant  ?  ?  Passed - Last BP in normal range  ?  BP Readings from Last 1 Encounters:  ?05/03/21 128/68  ?  ?  ?  ?  Passed - Valid encounter within last 6 months  ?  Recent Outpatient Visits   ? ?      ? 1  week ago Annual physical exam  ? [facility] [place], Dionne Bucy, MD  ? 2 months ago Essential hypertension with goal blood pressure less than 130/80  ? [facility] [place], Dionne Bucy, MD  ? 6 months ago Essential hypertension with goal blood pressure less than 130/80  ? [facility] [place], Dionne Bucy, MD  ? 11 months ago Atypical mole  ? [facility] [place], Dionne Bucy, MD  ? 1 year ago Encounter for annual physical exam  ? [facility], Dionne Bucy, MD  ? ?  ?  ?Future Appointments   ? ?        ? In 5 months [place], Dionne Bucy, MD [facility]  ? ?  ? ?  ?  ?  ? atorvastatin (LIPITOR) 40 MG tablet 90 tablet 1  ?  Sig: TAKE 1 TABLET BY MOUTH EVERY DAY  ?  ? Cardiovascular:  Antilipid - Statins Failed - 05/12/2021  1:07 PM  ?  ?  Failed - Lipid Panel in normal range within the last 12 months  ?  Cholesterol, Total  ?Date Value Ref Range Status  ?10/29/2020 109 100 - 199 mg/dL Final  ? ?LDL Cholesterol (Calc)  ?Date Value Ref Range Status  ?11/10/2016 90 mg/dL (calc) Final  ?  Comment:  ?  Reference range: <100 ?Marland Kitchen ?Desirable range <100 mg/dL for primary prevention;   ?<70 mg/dL for patients with CHD or diabetic patients  ?with > or = 2 CHD risk factors. ?. ?LDL-C is now calculated using the Martin-Hopkins  ?calculation, which is a validated novel method providing  ?better accuracy than the Friedewald equation in the  ?estimation of LDL-C.  ?Cresenciano Genre et al. Annamaria Helling. 3329;518(84): 2061-2068  ?(http://education.[employer].com/faq/FAQ164) ?  ? ?LDL Chol Calc (NIH)  ?Date Value Ref Range Status  ?10/29/2020 52 0 - 99 mg/dL Final  ? ?HDL  ?Date Value Ref Range Status  ?10/29/2020 39 (L) >39 mg/dL Final  ? ?Triglycerides  ?Date Value Ref Range Status  ?10/29/2020  94 0 - 149 mg/dL Final  ? ?  ?  ?  Passed - Patient is not pregnant  ?  ?  Passed - Valid encounter within last 12 months  ?  Recent Outpatient Visits    ? ?      ? 1 week ago Annual physical exam  ? [facility] [place], Dionne Bucy, MD  ? 2 months ago Essential hypertension with goal blood pressure less than 130/80  ? [facility] [place], Dionne Bucy, MD  ? 6 months ago Essential hypertension with goal blood pressure less than 130/80  ? [facility] [place], Dionne Bucy, MD  ? 11 months ago Atypical mole  ? [facility] [place], Dionne Bucy, MD  ? 1 year ago Encounter for annual physical exam  ? [facility], Dionne Bucy, MD  ? ?  ?  ?Future Appointments   ? ?        ? In 5 months Bacigalupo, Dionne Bucy, MD [facility]  ? ?  ? ?  ?  ?  ? ?

## 2021-05-13 NOTE — Telephone Encounter (Signed)
Requested Prescriptions  ?Pending Prescriptions Disp Refills  ?? allopurinol (ZYLOPRIM) 300 MG tablet [Pharmacy Med Name: ALLOPURINOL 300 MG TABLET] 90 tablet 1  ?  Sig: TAKE 1 TABLET BY MOUTH EVERYDAY AT BEDTIME  ?  ? Endocrinology:  Gout Agents - allopurinol Failed - 05/12/2021  1:07 PM  ?  ?  Failed - Uric Acid in normal range and within 360 days  ?  Uric Acid  ?Date Value Ref Range Status  ?04/23/2019 4.2 3.8 - 8.4 mg/dL Final  ?  Comment:  ?             Therapeutic target for gout patients: <6.0  ?   ?  ?  Failed - CBC within normal limits and completed in the last 12 months  ?  WBC  ?Date Value Ref Range Status  ?02/12/2019 7.6 3.4 - 10.8 x10E3/uL Final  ?11/10/2016 9.6 3.8 - 10.8 Thousand/uL Final  ? ?RBC  ?Date Value Ref Range Status  ?02/12/2019 4.90 4.14 - 5.80 x10E6/uL Final  ?11/10/2016 5.39 4.20 - 5.80 Million/uL Final  ? ?Hemoglobin  ?Date Value Ref Range Status  ?02/12/2019 14.9 13.0 - 17.7 g/dL Final  ? ?Hematocrit  ?Date Value Ref Range Status  ?02/12/2019 42.9 37.5 - 51.0 % Final  ? ?MCHC  ?Date Value Ref Range Status  ?02/12/2019 34.7 31.5 - 35.7 g/dL Final  ?11/10/2016 34.2 32.0 - 36.0 g/dL Final  ? ?MCH  ?Date Value Ref Range Status  ?02/12/2019 30.4 26.6 - 33.0 pg Final  ?11/10/2016 30.1 27.0 - 33.0 pg Final  ? ?MCV  ?Date Value Ref Range Status  ?02/12/2019 88 79 - 97 fL Final  ? ?No results found for: PLTCOUNTKUC, LABPLAT, [place] ?RDW  ?Date Value Ref Range Status  ?02/12/2019 13.1 11.6 - 15.4 % Final  ? ?  ?  ?  Passed - Cr in normal range and within 360 days  ?  Creat  ?Date Value Ref Range Status  ?12/13/2016 1.36 (H) 0.70 - 1.25 mg/dL Final  ?  Comment:  ?  For patients >28 years of age, the reference limit ?for Creatinine is approximately 13% higher for people ?identified as African-American. ?. ?  ? ?Creatinine, Ser  ?Date Value Ref Range Status  ?05/03/2021 1.18 0.76 - 1.27 mg/dL Final  ?   ?  ?  Passed - Valid encounter within last 12 months  ?  Recent Outpatient Visits   ?      ? 1  week ago Annual physical exam  ? [facility] [place], Dionne Bucy, MD  ? 2 months ago Essential hypertension with goal blood pressure less than 130/80  ? [facility] [place], Dionne Bucy, MD  ? 6 months ago Essential hypertension with goal blood pressure less than 130/80  ? [facility] [place], Dionne Bucy, MD  ? 11 months ago Atypical mole  ? [facility] [place], Dionne Bucy, MD  ? 1 year ago Encounter for annual physical exam  ? [facility], Dionne Bucy, MD  ?  ?  ?Future Appointments   ?        ? In 5 months [place], Dionne Bucy, MD [facility]  ?  ? ?  ?  ?  ?? benazepril (LOTENSIN) 40 MG tablet [Pharmacy Med Name: BENAZEPRIL HCL 40 MG TABLET] 90 tablet 1  ?  Sig: TAKE 1 TABLET BY MOUTH EVERY DAY  ?  ? Cardiovascular:  ACE Inhibitors Passed - 05/12/2021  1:07  PM  ?  ?  Passed - Cr in normal range and within 180 days  ?  Creat  ?Date Value Ref Range Status  ?12/13/2016 1.36 (H) 0.70 - 1.25 mg/dL Final  ?  Comment:  ?  For patients >9 years of age, the reference limit ?for Creatinine is approximately 13% higher for people ?identified as African-American. ?. ?  ? ?Creatinine, Ser  ?Date Value Ref Range Status  ?05/03/2021 1.18 0.76 - 1.27 mg/dL Final  ?   ?  ?  Passed - K in normal range and within 180 days  ?  Potassium  ?Date Value Ref Range Status  ?05/03/2021 4.9 3.5 - 5.2 mmol/L Final  ?   ?  ?  Passed - Patient is not pregnant  ?  ?  Passed - Last BP in normal range  ?  BP Readings from Last 1 Encounters:  ?05/03/21 128/68  ?   ?  ?  Passed - Valid encounter within last 6 months  ?  Recent Outpatient Visits   ?      ? 1 week ago Annual physical exam  ? [facility] [place], Dionne Bucy, MD  ? 2 months ago Essential hypertension with goal blood pressure less than 130/80  ? [facility], Dionne Bucy, MD  ? 6 months ago Essential hypertension with goal blood pressure less than  130/80  ? [facility] [place], Dionne Bucy, MD  ? 11 months ago Atypical mole  ? [facility] [place], Dionne Bucy, MD  ? 1 year ago Encounter for annual physical exam  ? [facility], Dionne Bucy, MD  ?  ?  ?Future Appointments   ?        ? In 5 months [place], Dionne Bucy, MD [facility]  ?  ? ?  ?  ?  ?? atorvastatin (LIPITOR) 40 MG tablet [Pharmacy Med Name: ATORVASTATIN 40 MG TABLET] 90 tablet 2  ?  Sig: TAKE 1 TABLET BY MOUTH EVERY DAY  ?  ? Cardiovascular:  Antilipid - Statins Failed - 05/12/2021  1:07 PM  ?  ?  Failed - Lipid Panel in normal range within the last 12 months  ?  Cholesterol, Total  ?Date Value Ref Range Status  ?10/29/2020 109 100 - 199 mg/dL Final  ? ?LDL Cholesterol (Calc)  ?Date Value Ref Range Status  ?11/10/2016 90 mg/dL (calc) Final  ?  Comment:  ?  Reference range: <100 ?Marland Kitchen ?Desirable range <100 mg/dL for primary prevention;   ?<70 mg/dL for patients with CHD or diabetic patients  ?with > or = 2 CHD risk factors. ?. ?LDL-C is now calculated using the Martin-Hopkins  ?calculation, which is a validated novel method providing  ?better accuracy than the Friedewald equation in the  ?estimation of LDL-C.  ?Cresenciano Genre et al. Annamaria Helling. 8938;101(75): 2061-2068  ?(http://education.[facility].com/faq/FAQ164) ?  ? ?LDL Chol Calc (NIH)  ?Date Value Ref Range Status  ?10/29/2020 52 0 - 99 mg/dL Final  ? ?HDL  ?Date Value Ref Range Status  ?10/29/2020 39 (L) >39 mg/dL Final  ? ?Triglycerides  ?Date Value Ref Range Status  ?10/29/2020 94 0 - 149 mg/dL Final  ? ?  ?  ?  Passed - Patient is not pregnant  ?  ?  Passed - Valid encounter within last 12 months  ?  Recent Outpatient Visits   ?      ? 1 week ago Annual physical exam  ?  [facility] [place], Dionne Bucy, MD  ? 2 months ago Essential hypertension with goal blood pressure less than 130/80  ? [facility] [place], Dionne Bucy, MD  ? 6 months ago  Essential hypertension with goal blood pressure less than 130/80  ? [facility] [place], Dionne Bucy, MD  ? 11 months ago Atypical mole  ? [facility] [place], Dionne Bucy, MD  ? 1 year ago Encounter for annual physical exam  ? [facility], Dionne Bucy, MD  ?  ?  ?Future Appointments   ?        ? In 5 months [place], Dionne Bucy, MD [facility]  ?  ? ?  ?  ?  ? ? ?

## 2021-05-14 ENCOUNTER — Encounter: Payer: Self-pay | Admitting: Urology

## 2021-05-17 ENCOUNTER — Other Ambulatory Visit: Payer: Self-pay

## 2021-05-17 ENCOUNTER — Ambulatory Visit (INDEPENDENT_AMBULATORY_CARE_PROVIDER_SITE_OTHER): Payer: PPO | Admitting: *Deleted

## 2021-05-17 DIAGNOSIS — R972 Elevated prostate specific antigen [PSA]: Secondary | ICD-10-CM | POA: Diagnosis not present

## 2021-05-17 DIAGNOSIS — N400 Enlarged prostate without lower urinary tract symptoms: Secondary | ICD-10-CM

## 2021-05-17 LAB — BLADDER SCAN AMB NON-IMAGING: Scan Result: 947

## 2021-05-17 NOTE — Progress Notes (Signed)
Simple Catheter Placement ? ?Due to urinary retention patient is present today for a foley cath placement.  Patient was cleaned and prepped in a sterile fashion with betadine. A 16 FR foley catheter was inserted, urine return was noted  1,25m, urine was yellow  in color.  The balloon was filled with 10cc of sterile water.  A leg bag was attached for drainage. Patient was also given a night bag to take home and was given instruction on how to change from one bag to another.  Patient was given instruction on proper catheter care.  Patient tolerated well, no complications were noted  ? ?Performed by: JGaspar Cola? ?Additional notes/ Follow up: 10 days     ?

## 2021-05-18 ENCOUNTER — Other Ambulatory Visit: Payer: Self-pay | Admitting: Family Medicine

## 2021-05-18 NOTE — Telephone Encounter (Signed)
Copied from [place] [id number]. Topic: Quick Communication - Rx Refill/Question ?>> May 18, 2021  4:21 PM Lenon Curt, Everette A wrote: ?Medication: benazepril (LOTENSIN) 40 MG tablet [[id number]]  ? ?Has the patient contacted their pharmacy? No. ?(Agent: If no, request that the patient contact the pharmacy for the refill. If patient does not wish to contact the pharmacy document the reason why and proceed with request.) ?(Agent: If yes, when and what did the pharmacy advise?) ? ?Preferred Pharmacy (with phone number or street name): [id number]?Phone: [phone]: [phone]?Hours: Not open 24 hours ? ?Has the patient been seen for an appointment in the last year OR does the patient have an upcoming appointment? Yes.   ? ?Agent: Please be advised that RX refills may take up to 3 business days. We ask that you follow-up with your pharmacy. ?

## 2021-05-19 NOTE — Telephone Encounter (Signed)
[facility] and [facility] to [place] about the refill(s) Benazepril requested. Advised it was sent on 05/13/21 #90/1 refill(s). She says the medication is available for pickup. Patient called, left detailed VM per DPR that medication is available for pickup. ?

## 2021-05-25 ENCOUNTER — Ambulatory Visit: Payer: PPO

## 2021-05-25 ENCOUNTER — Ambulatory Visit (INDEPENDENT_AMBULATORY_CARE_PROVIDER_SITE_OTHER): Payer: PPO | Admitting: *Deleted

## 2021-05-25 ENCOUNTER — Other Ambulatory Visit: Payer: Self-pay

## 2021-05-25 DIAGNOSIS — N400 Enlarged prostate without lower urinary tract symptoms: Secondary | ICD-10-CM

## 2021-05-25 LAB — BLADDER SCAN AMB NON-IMAGING: Scan Result: 453

## 2021-05-25 MED ORDER — SILODOSIN 8 MG PO CAPS: 8.0000 mg | ORAL_CAPSULE | Freq: Every day | ORAL | 3 refills | Status: DC

## 2021-05-25 NOTE — Progress Notes (Signed)
Catheter Removal ? ?Patient is present today for a catheter removal.  80m of water was drained from the balloon. A 16FR foley cath was removed from the bladder no complications were noted . Patient tolerated well. ? ?Performed by: JGaspar ColaCMA ? ?Follow up/ Additional notes: This afternoon  ?

## 2021-05-25 NOTE — Addendum Note (Signed)
Addended by: Chrystie Nose on: 05/25/2021 02:25 PM ? ? Modules accepted: Orders ? ?

## 2021-05-25 NOTE — Progress Notes (Addendum)
Simple Catheter Placement ? ?Due to urinary retention patient is present today for a foley cath placement.  Patient was cleaned and prepped in a sterile fashion with betadine. A 16 FR foley catheter was inserted, urine return was noted  479m, urine was Yellow  in color.  The balloon was filled with 10cc of sterile water.  A night bag was attached for drainage. Patient was also given a night bag to take home and was given instruction on how to change from one bag to another.  Patient was given instruction on proper catheter care.  Patient tolerated well, no complications were noted  ? ?Performed by: CElberta LeatherwoodCMA ? ?Additional notes/ Follow up:  1 week v &t  ?

## 2021-05-26 ENCOUNTER — Encounter: Payer: Self-pay | Admitting: Urology

## 2021-05-26 NOTE — Telephone Encounter (Signed)
Patient left message on VM triage stating he is now dry heaving. ?

## 2021-05-26 NOTE — Telephone Encounter (Signed)
Patient called back stating he is still having some side pain and some blood in his catheter bag. He denies any leakage around the catheter, fever, chills, clots, or any other symptoms. Pt states the catheter is draining fine. He did take Rapaflo this morning. Please advise ?

## 2021-05-27 ENCOUNTER — Telehealth: Payer: Self-pay | Admitting: Urology

## 2021-05-27 MED ORDER — OXYBUTYNIN CHLORIDE 5 MG PO TABS
5.0000 mg | ORAL_TABLET | Freq: Three times a day (TID) | ORAL | 0 refills | Status: DC | PRN
Start: 2021-05-27 — End: 2022-03-11

## 2021-05-27 NOTE — Telephone Encounter (Signed)
Regarding his MyChart message from yesterday.  Please check with patient and see how he is feeling this morning.  Please let him know that my chart is typically for nonurgent matters and I did not see the message until this morning ?

## 2021-05-27 NOTE — Telephone Encounter (Signed)
Patient states he was having bladder spasms . He is doing much better. I sent in some oxybutnin as needed.  ?

## 2021-06-01 ENCOUNTER — Ambulatory Visit: Payer: PPO

## 2021-06-01 ENCOUNTER — Ambulatory Visit (INDEPENDENT_AMBULATORY_CARE_PROVIDER_SITE_OTHER): Payer: PPO | Admitting: Family Medicine

## 2021-06-01 ENCOUNTER — Other Ambulatory Visit: Payer: Self-pay

## 2021-06-01 DIAGNOSIS — N400 Enlarged prostate without lower urinary tract symptoms: Secondary | ICD-10-CM

## 2021-06-01 LAB — BLADDER SCAN AMB NON-IMAGING: Scan Result: 1165

## 2021-06-01 NOTE — Progress Notes (Signed)
Catheter Removal ? ?Patient is present today for a catheter removal.  45m of water was drained from the balloon. A 16FR foley cath was removed from the bladder no complications were noted . Patient tolerated well. ? ?Performed by: CElberta LeatherwoodCMA ? ?Follow up/ Additional notes: Afternoon  ? ?PM PVR 1165 ? ?Simple Catheter Placement ? ?Due to urinary retention patient is present today for a foley cath placement.  Patient was cleaned and prepped in a sterile fashion with betadine. A 16 FR foley catheter was inserted, urine return was noted  13242m urine was yellow in color.  The balloon was filled with 10cc of sterile water.  A leg bag was attached for drainage. Patient was also given a night bag to take home and was given instruction on how to change from one bag to another.  Patient was given instruction on proper catheter care.  Patient tolerated well, no complications were noted  ? ?Performed by: CaElberta LeatherwoodMA ? ?Additional notes/ Follow up: return for cysto  ?

## 2021-06-17 ENCOUNTER — Ambulatory Visit: Payer: PPO | Admitting: Physician Assistant

## 2021-06-17 ENCOUNTER — Ambulatory Visit: Payer: PPO | Admitting: Urology

## 2021-06-17 ENCOUNTER — Encounter: Payer: Self-pay | Admitting: Urology

## 2021-06-17 VITALS — BP 147/65 | HR 68 | Ht 69.0 in | Wt 200.0 lb

## 2021-06-17 DIAGNOSIS — N319 Neuromuscular dysfunction of bladder, unspecified: Secondary | ICD-10-CM

## 2021-06-17 DIAGNOSIS — R339 Retention of urine, unspecified: Secondary | ICD-10-CM

## 2021-06-17 HISTORY — DX: Neuromuscular dysfunction of bladder, unspecified: N31.9

## 2021-06-17 LAB — BLADDER SCAN AMB NON-IMAGING: Scan Result: 909

## 2021-06-17 NOTE — Progress Notes (Signed)
Afternoon follow-up ? ?Patient returned to clinic this afternoon for repeat PVR. He has been able to urinate small volumes but denies lower abdominal pain. PVR 99m. ? ?Results for orders placed or performed in visit on 06/17/21  ?BLADDER SCAN AMB NON-IMAGING  ?Result Value Ref Range  ? Scan Result 909   ?  ?Voiding trial failed. Counseled patient on treatment options including chronic indwelling Foley, CIC, and urodynamics testing with possible outlet procedure. He wishes to pursue UDS; referral placed today. He elects for Foley replacement at this time, see procedure note below. He would ultimately be interested in CIC teaching if found to have a hypotonic bladder. ? ?Follow up: Return in about 4 weeks (around 07/15/2021) for Catheter exchange.  ? ?SDebroah Loop PA-C  ?06/17/21 ?3:23 PM ? ?

## 2021-06-17 NOTE — Addendum Note (Signed)
Addended by: Mickle Plumb on: 06/17/2021 04:41 PM ? ? Modules accepted: Level of Service ? ?

## 2021-06-17 NOTE — Progress Notes (Signed)
? ?  06/17/21 ? ?CC:  ?Chief Complaint  ?Patient presents with  ? Cysto  ? ? ?HPI: Noted on prostate MRI March 2023 to have bladder distention.  Was asked to come in for a PVR which was elevated at 947 cc.  Started on silodosin and Foley catheter drainage x10 days.  He has failed voiding trials x2 with follow-up residuals 450 and 1165 mL.  No sensation of bladder fullness or urge to void ? ?Blood pressure (!) 147/65, pulse 68, height '5\' 9"'$  (1.753 m), weight 200 lb (90.7 kg). ?NED. A&Ox3.   ?No respiratory distress   ?Abd soft, NT, ND ?Normal phallus with bilateral descended testicles ? ?Cystoscopy Procedure Note ? ?Patient identification was confirmed, informed consent was obtained, and patient was prepped using Betadine solution.  Lidocaine jelly was administered per urethral meatus.   ? ? ?Pre-Procedure: ?- Inspection reveals a normal caliber urethral meatus. ? ?Procedure: ?The flexible cystoscope was introduced without difficulty ?- No urethral strictures/lesions are present. ?- Moderate lateral lobe enlargement prostate  ?- Normal bladder neck ?- Mucosal evaluation suboptimal secondary to sediment.  No obvious tumor seen ? ?Retroflexion shows no abnormalities ? ? ?Post-Procedure: ?- Patient tolerated the procedure well ? ?Assessment/ Plan: ?Urinary retention most likely chronic and secondary to detrusor hypotonicity ?He desired a repeat voiding trial and will return today for bladder scan ?Discussed intermittent catheterization versus Foley catheter placement for persistent retention ?Urodynamic study was discussed ? ? ? ?Abbie Sons, MD ? ?

## 2021-06-17 NOTE — Progress Notes (Signed)
Catheter Removal ? ?Patient is present today for a catheter removal.  76m of water was drained from the balloon. A 16FR foley cath was removed from the bladder no complications were noted . Patient tolerated well. ? ?Performed by: JGaspar ColaCMA ? ? ?

## 2021-06-17 NOTE — Addendum Note (Signed)
Addended by: Mickle Plumb on: 06/17/2021 03:40 PM ? ? Modules accepted: Orders ? ?

## 2021-06-17 NOTE — Addendum Note (Signed)
Addended by: Alvera Novel on: 06/17/2021 01:56 PM ? ? Modules accepted: Orders ? ?

## 2021-07-06 DIAGNOSIS — R338 Other retention of urine: Secondary | ICD-10-CM | POA: Diagnosis not present

## 2021-07-08 ENCOUNTER — Other Ambulatory Visit: Payer: Self-pay | Admitting: Physician Assistant

## 2021-07-15 ENCOUNTER — Ambulatory Visit (INDEPENDENT_AMBULATORY_CARE_PROVIDER_SITE_OTHER): Payer: PPO | Admitting: Physician Assistant

## 2021-07-15 DIAGNOSIS — R339 Retention of urine, unspecified: Secondary | ICD-10-CM

## 2021-07-15 NOTE — Progress Notes (Signed)
Patient presented to clinic today for scheduled monthly catheter change, however he underwent urodynamics 1 week ago and his catheter was exchanged at that time. ? ?We discussed deferring catheter exchange today pending urodynamics results visit with Dr. Bernardo Heater.  Upon review of his urodynamics results, I noted that he was unable to generate a bladder contraction.  We discussed that this is not encouraging in terms of his ability to ultimately spontaneously void.  We discussed that the likely recommendation from Dr. Bernardo Heater will be chronic indwelling Foley catheter versus CIC. ? ?Patient continues to be interested in CIC if that is ultimately the recommendation.  We discussed that if he decides to move forward with this, I will bring him back to clinic for an instructional video and demonstration in clinic.  He is in agreement with this plan. ?

## 2021-07-22 ENCOUNTER — Encounter: Payer: Self-pay | Admitting: Urology

## 2021-07-22 ENCOUNTER — Ambulatory Visit: Payer: PPO | Admitting: Urology

## 2021-07-22 VITALS — BP 160/72 | HR 52 | Ht 69.0 in | Wt 200.0 lb

## 2021-07-22 DIAGNOSIS — R339 Retention of urine, unspecified: Secondary | ICD-10-CM

## 2021-07-22 MED ORDER — FINASTERIDE 5 MG PO TABS: 5.0000 mg | ORAL_TABLET | Freq: Every day | ORAL | 3 refills | Status: DC

## 2021-07-22 NOTE — Progress Notes (Signed)
07/22/2021 2:37 PM   Mccoy Testa Kilburg [birth year] [id number]  Referring provider: Virginia Crews, MD [address] [place],  [place] [address]  Chief Complaint  Patient presents with   Other    HPI: 71 y.o. male presents for follow-up of urinary retention and discussion of urodynamic results.  Cystoscopy 06/17/2021 showed moderate lateral lobe enlargement He has failed several voiding trials Urodynamic study performed in [facility] 07/06/2021 remarkable for decreased bladder sensation with for sensation occurring at 290 mL.  Normal sensation was not reported and strong desire occurred at 402 mL.  Max capacity 617 mL.  On flow study he did not generate a voluntary contraction and was unable to void.   PMH: Past Medical History:  Diagnosis Date   Abdominal hernia    umbilical   Chronic kidney disease (CKD), active medical management without dialysis    Chronic kidney disease, stage III (moderate) (HCC)    Coronary artery disease    GERD (gastroesophageal reflux disease)    History of kidney stones    h/o   Hyperlipidemia    Hypertension    Kidney stones    MI (myocardial infarction) ([place]) 2013   1 stent placed-sees Dr End(cardiologist) last seen in nov 2018    Surgical History: Past Surgical History:  Procedure Laterality Date   COLONOSCOPY  2013   CORONARY ANGIOPLASTY WITH STENT PLACEMENT  2013   [place]   ESOPHAGOGASTRODUODENOSCOPY (EGD) WITH PROPOFOL N/A 01/05/2017   Procedure: ESOPHAGOGASTRODUODENOSCOPY (EGD) WITH PROPOFOL With Dilation;  Surgeon: Lin Landsman, MD;  Location: [place];  Service: Gastroenterology;  Laterality: N/A;   ESOPHAGOGASTRODUODENOSCOPY (EGD) WITH PROPOFOL N/A 07/03/2019   Procedure: ESOPHAGOGASTRODUODENOSCOPY (EGD) WITH PROPOFOL;  Surgeon: Lin Landsman, MD;  Location: [facility] ENDOSCOPY;  Service: Gastroenterology;  Laterality: N/A;   ESOPHAGOGASTRODUODENOSCOPY (EGD) WITH PROPOFOL N/A 11/04/2019    Procedure: ESOPHAGOGASTRODUODENOSCOPY (EGD) WITH PROPOFOL;  Surgeon: Lin Landsman, MD;  Location: [facility] ENDOSCOPY;  Service: Gastroenterology;  Laterality: N/A;   HERNIA REPAIR     LITHOTRIPSY  8182   UMBILICAL HERNIA REPAIR N/A 02/20/2017   Primary repair 2 cm umbilical hernia;  Surgeon: Robert Bellow, MD;  Location: [facility];  Service: General;  Laterality: N/A;    Home Medications:  Allergies as of 07/22/2021   No Known Allergies      Medication List        Accurate as of Jul 22, 2021  2:37 PM. If you have any questions, ask your nurse or doctor.          allopurinol 300 MG tablet Commonly known as: ZYLOPRIM TAKE 1 TABLET BY MOUTH EVERYDAY AT BEDTIME   amLODipine 10 MG tablet Commonly known as: NORVASC Take 1 tablet (10 mg total) by mouth daily.   aspirin 81 MG chewable tablet Chew 81 mg by mouth at bedtime.   atorvastatin 40 MG tablet Commonly known as: LIPITOR TAKE 1 TABLET BY MOUTH EVERY DAY   benazepril 40 MG tablet Commonly known as: LOTENSIN TAKE 1 TABLET BY MOUTH EVERY DAY   nitroGLYCERIN 0.4 MG SL tablet Commonly known as: NITROSTAT Place 1 tablet (0.4 mg total) under the tongue every 5 (five) minutes as needed for chest pain.   oxybutynin 5 MG tablet Commonly known as: DITROPAN Take 1 tablet (5 mg total) by mouth every 8 (eight) hours as needed for bladder spasms.   silodosin 8 MG Caps capsule Commonly known as: RAPAFLO Take 1 capsule (8 mg total) by mouth daily with breakfast.  Allergies: No Known Allergies  Family History: Family History  Problem Relation Age of Onset   Dementia Mother 48   Lung disease Father 63       collapsed lung   Healthy Sister    Healthy Brother    Heart Problems Brother    Healthy Son    Healthy Sister    Colon cancer Neg Hx    Prostate cancer Neg Hx    Heart disease Neg Hx     Social History:  reports that he has never smoked. He has never used smokeless tobacco. He reports that he does  not drink alcohol and does not use drugs.   Physical Exam: BP (!) 160/72   Pulse (!) 52   Ht '5\' 9"'$  (1.753 m)   Wt 200 lb (90.7 kg)   BMI 29.53 kg/m   Constitutional:  Alert and oriented, No acute distress. Psychiatric: Normal mood and affect.   Assessment & Plan:    1.  Urinary retention Hyposensitive bladder and inability to generate a voluntary contraction We discussed there are no medications or procedures which will allow him to void spontaneously Options of chronic indwelling Foley (least desirable), suprapubic tube placement and intermittent catheterization were discussed He feels he would be able to perform IC and will schedule teaching He was concerned about further enlargement of his prostate making catheterization difficult.  Rx finasteride sent to pharmacy for prophylactic benefit.  May discontinue the [place], MD  [facility] [address], [place], [place] [address] [phone]

## 2021-07-29 ENCOUNTER — Ambulatory Visit: Payer: PPO | Admitting: Physician Assistant

## 2021-07-29 ENCOUNTER — Ambulatory Visit: Payer: PPO | Admitting: Urology

## 2021-07-29 VITALS — BP 150/66 | HR 57 | Ht 69.0 in | Wt 200.0 lb

## 2021-07-29 DIAGNOSIS — R339 Retention of urine, unspecified: Secondary | ICD-10-CM

## 2021-07-29 NOTE — Patient Instructions (Signed)

## 2021-07-29 NOTE — Progress Notes (Addendum)
Catheter Removal  Patient is present today for a catheter removal.  73m of water was drained from the balloon. A 16FR foley cath was removed from the bladder no complications were noted . Patient tolerated well.  Performed by: SDebroah Loop PA-C   Continuous Intermittent Catheterization  Due to BPH and acontractile bladder patient is present today for a teaching of self I & O Catheterization. Patient was given detailed verbal and printed instructions of self catheterization. Patient was cleaned and prepped in a clean fashion.  With instruction and assistance patient inserted a 14FR coude [facility] SpeediCath Standard and urine return was noted 10 ml, urine was yellow in color. Patient tolerated well, no complications were noted Patient was given a sample bag with supplies to take home.  Instructions were given for patient to cath 4 times daily.  An order was placed with [facility] for catheters to be sent to the patient's home. Patient requires coude catheters due to BPH and difficulty advancing straight catheters past his enlarged prostate.  Performed by: SDebroah Loop PA-C   Additional Notes: Return in about 1 year (around 07/30/2022) for Annual f/u with Dr. SBernardo Heater    I spent 25 minutes on the day of the encounter to include pre-visit record review, face-to-face time with the patient, and post-visit ordering of tests.

## 2021-08-04 DIAGNOSIS — R339 Retention of urine, unspecified: Secondary | ICD-10-CM | POA: Diagnosis not present

## 2021-08-05 ENCOUNTER — Ambulatory Visit: Payer: PPO | Admitting: Physician Assistant

## 2021-08-20 ENCOUNTER — Other Ambulatory Visit: Payer: Self-pay | Admitting: Urology

## 2021-08-26 ENCOUNTER — Other Ambulatory Visit: Payer: Self-pay | Admitting: Family Medicine

## 2021-09-28 DIAGNOSIS — D2262 Melanocytic nevi of left upper limb, including shoulder: Secondary | ICD-10-CM | POA: Diagnosis not present

## 2021-09-28 DIAGNOSIS — D225 Melanocytic nevi of trunk: Secondary | ICD-10-CM | POA: Diagnosis not present

## 2021-09-28 DIAGNOSIS — L821 Other seborrheic keratosis: Secondary | ICD-10-CM | POA: Diagnosis not present

## 2021-09-28 DIAGNOSIS — L538 Other specified erythematous conditions: Secondary | ICD-10-CM | POA: Diagnosis not present

## 2021-09-28 DIAGNOSIS — D0361 Melanoma in situ of right upper limb, including shoulder: Secondary | ICD-10-CM | POA: Diagnosis not present

## 2021-09-28 DIAGNOSIS — L820 Inflamed seborrheic keratosis: Secondary | ICD-10-CM | POA: Diagnosis not present

## 2021-09-28 DIAGNOSIS — D485 Neoplasm of uncertain behavior of skin: Secondary | ICD-10-CM | POA: Diagnosis not present

## 2021-09-28 DIAGNOSIS — D2261 Melanocytic nevi of right upper limb, including shoulder: Secondary | ICD-10-CM | POA: Diagnosis not present

## 2021-09-28 DIAGNOSIS — Z85820 Personal history of malignant melanoma of skin: Secondary | ICD-10-CM | POA: Diagnosis not present

## 2021-09-30 ENCOUNTER — Other Ambulatory Visit: Payer: Self-pay | Admitting: Family Medicine

## 2021-09-30 NOTE — Telephone Encounter (Signed)
Requested Prescriptions  Pending Prescriptions Disp Refills  . amLODipine (NORVASC) 10 MG tablet [Pharmacy Med Name: AMLODIPINE BESYLATE 10 MG TAB] 90 tablet 1    Sig: TAKE 1 TABLET BY MOUTH EVERY DAY     Cardiovascular: Calcium Channel Blockers 2 Failed - 09/30/2021  2:25 AM      Failed - Last BP in normal range    BP Readings from Last 1 Encounters:  07/29/21 (!) 150/66         Passed - Last Heart Rate in normal range    Pulse Readings from Last 1 Encounters:  07/29/21 (!) 57         Passed - Valid encounter within last 6 months    Recent Outpatient Visits          5 months ago Annual physical exam   [facility], Dionne Bucy, MD   7 months ago Essential hypertension with goal blood pressure less than 130/80   [facility], Dionne Bucy, MD   11 months ago Essential hypertension with goal blood pressure less than 130/80   [facility], Dionne Bucy, MD   1 year ago Atypical mole   [place], Dionne Bucy, MD   1 year ago Encounter for annual physical exam   [facility], Dionne Bucy, MD      Future Appointments            In 1 month [place], Dionne Bucy, MD [facility], [place]   [place], Clance Boll, NP [facility], [place]

## 2021-10-18 ENCOUNTER — Ambulatory Visit (INDEPENDENT_AMBULATORY_CARE_PROVIDER_SITE_OTHER): Payer: PPO | Admitting: Family Medicine

## 2021-10-18 ENCOUNTER — Encounter: Payer: Self-pay | Admitting: Family Medicine

## 2021-10-18 VITALS — BP 124/66 | HR 54 | Temp 98.2°F | Resp 16 | Wt 203.8 lb

## 2021-10-18 DIAGNOSIS — M1A09X0 Idiopathic chronic gout, multiple sites, without tophus (tophi): Secondary | ICD-10-CM

## 2021-10-18 DIAGNOSIS — K7689 Other specified diseases of liver: Secondary | ICD-10-CM

## 2021-10-18 DIAGNOSIS — I252 Old myocardial infarction: Secondary | ICD-10-CM

## 2021-10-18 DIAGNOSIS — N182 Chronic kidney disease, stage 2 (mild): Secondary | ICD-10-CM

## 2021-10-18 DIAGNOSIS — I2510 Atherosclerotic heart disease of native coronary artery without angina pectoris: Secondary | ICD-10-CM | POA: Diagnosis not present

## 2021-10-18 DIAGNOSIS — R7303 Prediabetes: Secondary | ICD-10-CM | POA: Diagnosis not present

## 2021-10-18 DIAGNOSIS — R339 Retention of urine, unspecified: Secondary | ICD-10-CM | POA: Insufficient documentation

## 2021-10-18 DIAGNOSIS — N400 Enlarged prostate without lower urinary tract symptoms: Secondary | ICD-10-CM | POA: Diagnosis not present

## 2021-10-18 DIAGNOSIS — I10 Essential (primary) hypertension: Secondary | ICD-10-CM

## 2021-10-18 NOTE — Progress Notes (Signed)
    SUBJECTIVE:   CHIEF COMPLAINT / HPI:   Hypertension, h/o MI: - Medications: benazepril, amlodipine, NTG prn, ASA '81mg'$  - Compliance: good. Has not had to use NTG. - Checking BP at home: occasionally - Denies any SOB, CP, vision changes, LE edema, medication SEs, or symptoms of hypotension  Benign Prostatic Hypertrophy, Urinary retention - Medications: finasteride - follows with Urology, last appt 5/25. Prostate MRI with no findings suspicious for cancer but did have distended bladder.  F/u bladder scan with PVR with 9106m and failed voiding trial.  - Urodynamic study 07/2021 with decreased bladder sensation, no voluntary bladder contraction and inability to void. - Now doing intermittent self-cath. - no longer on ditropan  Gout - on allopurinol. No flares in 7-8 years since starting. Typical flares in b/l feet.  H/o melanoma - following with derm, had recent skin check with +melanoma size of pin head on R forearm. Going back in a few weeks for removal of margins.   OBJECTIVE:   BP 124/66 (BP Location: Left Arm, Patient Position: Sitting, Cuff Size: Large)   Pulse (!) 54   Temp 98.2 F (36.8 C) (Oral)   Resp 16   Wt 203 lb 12.8 oz (92.4 kg)   BMI 30.10 kg/m   Gen: well appearing, in NAD Card: RRR Lungs: CTAB Ext: WWP, no edema   ASSESSMENT/PLAN:   Problem List Items Addressed This Visit       Cardiovascular and Mediastinum   Coronary artery disease involving native coronary artery of native heart without angina pectoris    Chronic, asymptomatic. Continue current regimen. Continue to follow with cardiology.      Relevant Orders   Basic Metabolic Panel (BMET)   Hemoglobin A1c   Essential hypertension with goal blood pressure less than 130/80 - Primary    At goal, no changes.       Relevant Orders   Basic Metabolic Panel (BMET)     Digestive   Liver cyst    F/u RUQ [place] today, to be completed in October.      Relevant Orders   [place]domen Limited  RUQ (LIVER/GB)     Genitourinary   BPH without obstruction/lower urinary tract symptoms   CKD (chronic kidney disease) stage 2, GFR 60-89 ml/min   Relevant Orders   Basic Metabolic Panel (BMET)   Hemoglobin A1c   Urinary retention    Doing well with intermittent self cath. Continue to follow with Urology.         Other   Gout    Chronic, no flares on allopurinol. Recheck uric acid levels.      Relevant Orders   Uric acid   H/O acute myocardial infarction   Relevant Orders   Basic Metabolic Panel (BMET)   Hemoglobin A1c   Prediabetes   Relevant Orders   Basic Metabolic Panel (BMET)   Hemoglobin A1c   F/u 6 mths.   AMyles Gip DO

## 2021-10-18 NOTE — Assessment & Plan Note (Signed)
Doing well with intermittent self cath. Continue to follow with Urology.

## 2021-10-18 NOTE — Assessment & Plan Note (Signed)
F/u RUQ [place] ordered today, to be completed in October.

## 2021-10-18 NOTE — Assessment & Plan Note (Signed)
Chronic, no flares on allopurinol. Recheck uric acid levels.

## 2021-10-18 NOTE — Assessment & Plan Note (Signed)
Chronic, asymptomatic. Continue current regimen. Continue to follow with cardiology.

## 2021-10-18 NOTE — Assessment & Plan Note (Signed)
At goal, no changes

## 2021-10-19 LAB — HEMOGLOBIN A1C
Est. average glucose Bld gHb Est-mCnc: 137 mg/dL
Hgb A1c MFr Bld: 6.4 % — ABNORMAL HIGH (ref 4.8–5.6)

## 2021-10-19 LAB — BASIC METABOLIC PANEL
BUN/Creatinine Ratio: 15 (ref 10–24)
BUN: 20 mg/dL (ref 8–27)
CO2: 22 mmol/L (ref 20–29)
Calcium: 9.2 mg/dL (ref 8.6–10.2)
Chloride: 103 mmol/L (ref 96–106)
Creatinine, Ser: 1.34 mg/dL — ABNORMAL HIGH (ref 0.76–1.27)
Glucose: 118 mg/dL — ABNORMAL HIGH (ref 70–99)
Potassium: 4.9 mmol/L (ref 3.5–5.2)
Sodium: 138 mmol/L (ref 134–144)
eGFR: 57 mL/min/{1.73_m2} — ABNORMAL LOW (ref >59)

## 2021-10-19 LAB — URIC ACID: Uric Acid: 5 mg/dL (ref 3.8–8.4)

## 2021-10-22 DIAGNOSIS — D0361 Melanoma in situ of right upper limb, including shoulder: Secondary | ICD-10-CM | POA: Diagnosis not present

## 2021-10-28 DIAGNOSIS — R339 Retention of urine, unspecified: Secondary | ICD-10-CM | POA: Diagnosis not present

## 2021-10-29 ENCOUNTER — Other Ambulatory Visit: Payer: Self-pay

## 2021-10-29 DIAGNOSIS — R972 Elevated prostate specific antigen [PSA]: Secondary | ICD-10-CM

## 2021-10-29 DIAGNOSIS — N400 Enlarged prostate without lower urinary tract symptoms: Secondary | ICD-10-CM

## 2021-11-01 ENCOUNTER — Ambulatory Visit: Payer: PPO | Admitting: Family Medicine

## 2021-11-02 NOTE — Progress Notes (Signed)
Cardiology Office Note:    Date:  11/03/2021   ID:  Kevin Melton, DOB [birth year], MRN [id number]  PCP:  Virginia Crews, MD   [facility] Cardiologist:  Nelva Bush, MD     Referring MD: Virginia Crews, MD   CC: Here for 1 year follow up  History of Present Illness:    Kevin Melton is a 71 y.o. male with a hx of the following:  HTN CAD, s/p bare-metal stent to RCA in 2013 in the setting of inferior MI  Mixed hyperlipidemia RBBB CKD, stage 3 Prediabetes Overweight Bradycardia  He was last seen in the office on October 14, 2020 by Dr. Saunders Revel and reported feeling well.  Did admit to noticing some brief orthostatic lightheadedness in the past but denied any prior to that visit.  An echocardiogram revealed LVEF 60 to 65%, mild LVH, grade 1 diastolic dysfunction with average left ventricular global longitudinal strain at -20.7%, mild mitral valve regurgitation, mild aortic valve regurgitation, all other findings normal.  Nuclear medicine stress test in 2022 looked reassuring, there was a subtle abnormality that might have reflected a scar related to his prior heart attack in 2013, there was no evidence of a new blockage, incidental finding of a possible liver cyst.  Ultrasound of the abdomen of right upper quadrant was ordered and is planned to be done on December 21, 2021.  He is here for his 1 year follow-up appointment.  Doing well from cardiac perspective.  Denies any cardiac complaints or issues.  Denies any chest pain, shortness of breath, palpitations, dizziness, lightheadedness, fatigue, orthopnea/PND, melena, hematuria, hemoptysis, or claudication.  Says he has had a history of low heart rate for a while, completely asymptomatic with this.  Continues to be very active, participates in photography, and takes care of his wife who is handicapped.  Owns a blood pressure cuff at home and occasionally checks this, says it is mainly well  controlled at doctor visits.  Says his bladder is not working and planning to see urologist.  Self catheterizes 4 times a day.  Denies any fever, chills, nausea, vomiting, or malaise. Had recent melanoma spot taken off right arm, sees a dermatologist this Friday to get the stitches out.  Denies any other questions or concerns today.  Past Medical History:  Diagnosis Date   Abdominal hernia    umbilical   Chronic kidney disease (CKD), active medical management without dialysis    Chronic kidney disease, stage III (moderate) ([place])    Coronary artery disease    a. 2013 Inf MI/PCI: BMS to RCA; b. 11/2013 MV: small reversible apical anteroseptal defect/ischemia. EF 60%; c. 11/2020 MV: small, fixed basal inf defect - scar vs artifact. No ischemia. EF 45-50% (60-65% by echo).   Diastolic dysfunction    a. 11/2020 Echo: EF 60-65%, no rwma, mild LVH. GrI DD, nl RV fxn, mild MR/AI.   GERD (gastroesophageal reflux disease)    History of kidney stones    h/o   Hyperlipidemia    Hypertension    Kidney stones    MI (myocardial infarction) [facility]) 2013   1 stent placed-sees Dr End(cardiologist) last seen in nov 2018   RBBB     Past Surgical History:  Procedure Laterality Date   COLONOSCOPY  2013   CORONARY ANGIOPLASTY WITH STENT PLACEMENT  2013   [place]   ESOPHAGOGASTRODUODENOSCOPY (EGD) WITH PROPOFOL N/A 01/05/2017   Procedure: ESOPHAGOGASTRODUODENOSCOPY (EGD) WITH PROPOFOL With Dilation;  Surgeon: Sherri Sear  Reece Levy, MD;  Location: [facility];  Service: Gastroenterology;  Laterality: N/A;   ESOPHAGOGASTRODUODENOSCOPY (EGD) WITH PROPOFOL N/A 07/03/2019   Procedure: ESOPHAGOGASTRODUODENOSCOPY (EGD) WITH PROPOFOL;  Surgeon: Lin Landsman, MD;  Location: [facility] ENDOSCOPY;  Service: Gastroenterology;  Laterality: N/A;   ESOPHAGOGASTRODUODENOSCOPY (EGD) WITH PROPOFOL N/A 11/04/2019   Procedure: ESOPHAGOGASTRODUODENOSCOPY (EGD) WITH PROPOFOL;  Surgeon: Lin Landsman, MD;  Location: [facility]  ENDOSCOPY;  Service: Gastroenterology;  Laterality: N/A;   HERNIA REPAIR     LITHOTRIPSY  [id number]   UMBILICAL HERNIA REPAIR N/A 02/20/2017   Primary repair 2 cm umbilical hernia;  Surgeon: Robert Bellow, MD;  Location: [facility] ORS;  Service: General;  Laterality: N/A;    Current Medications: Current Meds  Medication Sig   allopurinol (ZYLOPRIM) 300 MG tablet TAKE 1 TABLET BY MOUTH EVERYDAY AT BEDTIME   amLODipine (NORVASC) 10 MG tablet TAKE 1 TABLET BY MOUTH EVERY DAY   aspirin 81 MG chewable tablet Chew 81 mg by mouth at bedtime.    atorvastatin (LIPITOR) 40 MG tablet TAKE 1 TABLET BY MOUTH EVERY DAY   benazepril (LOTENSIN) 40 MG tablet TAKE 1 TABLET BY MOUTH EVERY DAY   finasteride (PROSCAR) 5 MG tablet Take 1 tablet (5 mg total) by mouth daily.   oxybutynin (DITROPAN) 5 MG tablet Take 1 tablet (5 mg total) by mouth every 8 (eight) hours as needed for bladder spasms.   nitroGLYCERIN (NITROSTAT) 0.4 MG SL tablet Place 1 tablet (0.4 mg total) under the tongue every 5 (five) minutes as needed for chest pain.     Allergies:   Patient has no known allergies.   Social History   Socioeconomic History   Marital status: Married    Spouse name: [name]   Number of children: 1   Years of education: 12   Highest education level: High school graduate  Occupational History   Occupation: Self Employed    Comment: [employer]: semi-retired  Tobacco Use   Smoking status: Never   Smokeless tobacco: Never  Vaping Use   Vaping Use: Never used  Substance and Sexual Activity   Alcohol use: No   Drug use: No   Sexual activity: Yes    Partners: Female  Other Topics Concern   Not on file  Social History Narrative   Not on file   Social Determinants of Health   Financial Resource Strain: Low Risk  (05/03/2021)   Overall Financial Resource Strain (CARDIA)    Difficulty of Paying Living Expenses: Not hard at all  Food Insecurity: No Food Insecurity (05/03/2021)    Hunger Vital Sign    Worried About Running Out of Food in the Last Year: Never true    [place] in the Last Year: Never true  Transportation Needs: No Transportation Needs (05/03/2021)   PRAPARE - Hydrologist (Medical): No    Lack of Transportation (Non-Medical): No  Physical Activity: Insufficiently Active (05/03/2021)   Exercise Vital Sign    Days of Exercise per Week: 2 days    Minutes of Exercise per Session: 60 min  Stress: No Stress Concern Present (05/03/2021)   [place]    Feeling of Stress : Not at all  Social Connections: Moderately Isolated (05/03/2021)   Social Connection and Isolation Panel [NHANES]    Frequency of Communication with Friends and Family: More than three times a week    Frequency of  Social Gatherings with Friends and Family: Never    Attends Religious Services: Never    Marine scientist or Organizations: No    Attends Music therapist: Never    Marital Status: Married     Family History: The patient's family history includes Dementia (age of onset: 77) in his mother; Healthy in his brother, sister, sister, and son; Heart Problems in his brother; Lung disease (age of onset: 69) in his father. There is no history of Colon cancer, Prostate cancer, or Heart disease.  ROS:   Review of Systems  Constitutional: Negative.   HENT: Negative.    Eyes: Negative.   Respiratory: Negative.    Cardiovascular: Negative.   Gastrointestinal: Negative.   Genitourinary: Negative.        Unable to void-self catheterizes 4 times a day.  Sees urology.  Musculoskeletal: Negative.   Skin: Negative.        See HPI.  Neurological: Negative.   Endo/Heme/Allergies: Negative.   Psychiatric/Behavioral: Negative.     Please see the history of present illness.    All other systems reviewed and are negative.  EKGs/Labs/Other Studies Reviewed:    The  following studies were reviewed today:   EKG:  EKG is ordered today.  The ekg ordered today demonstrates sinus bradycardia, 48 bpm, right bundle branch block, otherwise no acute changes.   Nuclear medicine stress test on December 01, 2020:  Abnormal, probably low risk pharmacologic myocardial perfusion stress test.   There is a small in size, moderate in severity, fixed basal inferior defect that may represent scar versus artifact.   No significant ischemia is noted.   Left ventricular systolic function appears normal by visual estimation but is mildly reduced by Siemens calculation and QGS (45-50%).   Coronary artery calcification versus old stent noted in the right coronary artery.  Aortic atheterosclerosis is noted on the attenuation corection CT.   Hypodensity in the liver most likely represents a cyst but is incompletely characterized on this study.  Consider enhanced CT, MRI, or abdominal ultrasound for further characterization, as clinically indicated.   2D echocardiogram on November 19, 2020:  1. Left ventricular ejection fraction, by estimation, is 60 to 65%. The  left ventricle has normal function. The left ventricle has no regional  wall motion abnormalities. There is mild left ventricular hypertrophy.  Left ventricular diastolic parameters  are consistent with Grade I diastolic dysfunction (impaired relaxation).  The average left ventricular global longitudinal strain is -20.7 %. The  global longitudinal strain is normal.   2. Right ventricular systolic function is normal. The right ventricular  size is normal. There is normal pulmonary artery systolic pressure. The  estimated right ventricular systolic pressure is 85.2 mmHg.   3. The mitral valve is normal in structure. Mild mitral valve  regurgitation.   4. The aortic valve is normal in structure. Aortic valve regurgitation is  mild.   5. The inferior vena cava is normal in size with greater than 50%  respiratory  variability, suggesting right atrial pressure of 3 mmHg.    Recent Labs: 10/18/2021: BUN 20; Creatinine, Ser 1.34; Potassium 4.9; Sodium 138  Recent Lipid Panel    Component Value Date/Time   CHOL 109 10/29/2020 0942   TRIG 94 10/29/2020 0942   HDL 39 (L) 10/29/2020 0942   CHOLHDL 2.8 10/29/2020 0942   CHOLHDL 3.6 01/31/2017 1049   VLDL 26 01/31/2017 1049   LDLCALC 52 10/29/2020 0942   LDLCALC 90 11/10/2016 1630  Physical Exam:    VS:  BP 138/62 (BP Location: Left Arm, Patient Position: Sitting, Cuff Size: Normal)   Pulse (!) 48   Ht '5\' 9"'  (1.753 m)   Wt 202 lb 3.2 oz (91.7 kg)   SpO2 98%   BMI 29.86 kg/m     Wt Readings from Last 3 Encounters:  11/03/21 202 lb 3.2 oz (91.7 kg)  10/18/21 203 lb 12.8 oz (92.4 kg)  07/29/21 200 lb (90.7 kg)     GEN: Well nourished, well developed in no acute distress HEENT: Normal NECK: No JVD; No carotid bruits CARDIAC: Slow rate, regular rhythm, S1/S2 noted, no murmurs, rubs, gallops; 2+ peripheral pulses throughout, strong and equal bilaterally RESPIRATORY:  Clear to auscultation without rales, wheezing or rhonchi  ABDOMEN: Soft, non-tender, non-distended, bowel sounds x4 MUSCULOSKELETAL:  No edema; No deformity  SKIN: Warm and dry, Ace wrap and gauze noted wrapped along right forearm, dressing is clean dry and intact. NEUROLOGIC:  Alert and oriented x 3 PSYCHIATRIC:  Normal affect   ASSESSMENT:    1. Coronary artery disease involving native heart without angina pectoris, unspecified vessel or lesion type   2. S/p bare metal coronary artery stent   3. Hypertension, unspecified type   4. Mixed hyperlipidemia   5. Bradycardia   6. RBBB    PLAN:    In order of problems listed above:  Coronary artery disease, status post bare-metal stent to the RCA in 2013, history of inferior MI, hyperlipidemia with LDL goal < 70 Stable with no anginal symptoms. No indication for ischemic evaluation. Continue ASA, Amlodipine, Benazepril,  and Lipitor daily, and NG PRN. Refill NG today. Last LDL was 52 on 10/29/20. He is fasting today, will obtain FLP and CMET today.  Hypertension - chronic, not at goal of SBP < 130 BP on arrival today, 138/62. Repeat BP 140/80. Discussed to monitor BP at home at least 2 hours after medications and sitting for 5-10 minutes. Will let me know his BP readings in 2 weeks via MyChart. If SBP not at goal < 130, plan to initiate low dose hydralazine PRN. Cannot initiate BB due to bradycardia. Obtain CBC, CMET, and TSH today.   Bradycardia, RBBB - chronic, stable Asymptomatic. Avoid BB agents. Continue current medication regimen. Obtain CMET, CBC, and TSH today.   Chronic kidney disease, stage 3a  sCr on 10/18/21 was 1.34, eGFR was 57. 2 years ago sCr was similar. Repeat CMET. Sees Urologist but does not see Nephrologist. Will continue to monitor.   5. Urinary retention - chronic, not progressing Sees urology and self-catheterizing 4 times daily. Last CBC stable in 2020. Update CBC with differential today.     6. Disposition: F/U in 1 year with Dr. Saunders Revel or sooner if anything changes.   Medication Adjustments/Labs and Tests Ordered: Current medicines are reviewed at length with the patient today.  Concerns regarding medicines are outlined above.  Orders Placed This Encounter  Procedures   Comp Met (CMET)   Lipid panel   CBC w/Diff   TSH   EKG 12-Lead   Meds ordered this encounter  Medications   nitroGLYCERIN (NITROSTAT) 0.4 MG SL tablet    Sig: Place 1 tablet (0.4 mg total) under the tongue every 5 (five) minutes as needed for chest pain.    Dispense:  25 tablet    Refill:  0    Patient Instructions  Medication Instructions:    Your physician recommends that you continue on your current medications as directed.  Please refer to the Current Medication list given to you today.    *If you need a refill on your cardiac medications before your next appointment, please call your  pharmacy*   Lab Work:  Your physician recommends that you return for lab work [facility].  -LIPID -CBC W/DIFF -TSH -CMET  If you have labs (blood work) drawn today and your tests are completely normal, you will receive your results only by: [place] (if you have [place]) OR A paper copy in the mail If you have any lab test that is abnormal or we need to change your treatment, we will call you to review the results.   Testing/Procedures:  NONE   Follow-Up: At [facility], you and your health needs are our priority.  As part of our continuing mission to provide you with exceptional heart care, we have created designated Provider Care Teams.  These Care Teams include your primary Cardiologist (physician) and Advanced Practice Providers (APPs -  Physician Assistants and Nurse Practitioners) who all work together to provide you with the care you need, when you need it.  We recommend signing up for the patient portal called "MyChart".  Sign up information is provided on this After Visit Summary.  MyChart is used to connect with patients for Virtual Visits (Telemedicine).  Patients are able to view lab/test results, encounter notes, upcoming appointments, etc.  Non-urgent messages can be sent to your provider as well.   To learn more about what you can do with MyChart, go to [email or web address].    Your next appointment:   12 month(s)  The format for your next appointment:   In Person  Provider:   You may see Nelva Bush, MD or one of the following Advanced Practice Providers on your designated Care Team:   Murray Hodgkins, NP Christell Faith, PA-C Cadence Kathlen Mody, PA-C Gerrie Nordmann, NP   OTHER INSTRUCTIONS:  Please check your Blood pressure for the next following 2 weeks.  If your systolic Blood pressure is anywhere between 130-140 rang Please reach out to [place] before hand to discuss possible adjustments in medications before your next visit  [phone]   Tips to Measure your Blood Pressure Correctly  To determine whether you have hypertension, a medical professional will take a blood pressure reading. How you prepare for the test, the position of your arm, and other factors can change a blood pressure reading by 10% or more. That could be enough to hide high blood pressure, start you on a drug you don't really need, or lead your doctor to incorrectly adjust your medications.  National and international guidelines offer specific instructions for measuring blood pressure. If a doctor, nurse, or medical assistant isn't doing it right, don't hesitate to ask him or her to get with the guidelines.  Here's what you can do to ensure a correct reading:  Don't drink a caffeinated beverage or smoke during the 30 minutes before the test.  Sit quietly for five minutes before the test begins.  During the measurement, sit in a chair with your feet on the floor and your arm supported so your elbow is at about heart level.  The inflatable part of the cuff should completely cover at least 80% of your upper arm, and the cuff should be placed on bare skin, not over a shirt.  Don't talk during the measurement.  Have your blood pressure measured twice, with a brief break in between. If the readings are different by  5 points or more, have it done a third time.  In 2017, new guidelines from the [place], the [employer] of Cardiology, and nine other health organizations lowered the diagnosis of high blood pressure to 130/80 mm Hg or higher for all adults. The guidelines also redefined the various blood pressure categories to now include normal, elevated, Stage 1 hypertension, Stage 2 hypertension, and hypertensive crisis (see "Blood pressure categories").  Blood pressure categories  Blood pressure category SYSTOLIC (upper number)  DIASTOLIC (lower number)  Normal Less than 120 mm Hg and Less than 80 mm Hg  Elevated 120-129 mm Hg  and Less than 80 mm Hg  High blood pressure: Stage 1 hypertension 130-139 mm Hg or 80-89 mm Hg  High blood pressure: Stage 2 hypertension 140 mm Hg or higher or 90 mm Hg or higher  Hypertensive crisis (consult your doctor immediately) Higher than 180 mm Hg and/or Higher than 120 mm Hg  Source: American Heart Association and American Stroke Association. For more on getting your blood pressure under control, buy Controlling Your Blood Pressure, a Special Health Report from [facility].   Blood Pressure Log   Date   Time  Blood Pressure  Position  Example: Nov 1 9 AM 124/78 sitting                                                       Important Information About Sugar         Signed, Finis Bud, NP  11/03/2021 10:40 AM    [place]

## 2021-11-03 ENCOUNTER — Other Ambulatory Visit
Admission: RE | Admit: 2021-11-03 | Discharge: 2021-11-03 | Disposition: A | Discharge status: Home / Self Care | Payer: PPO | Source: Ambulatory Visit | Attending: Nurse Practitioner | Admitting: Nurse Practitioner

## 2021-11-03 ENCOUNTER — Ambulatory Visit: Payer: PPO | Attending: Nurse Practitioner | Admitting: Nurse Practitioner

## 2021-11-03 ENCOUNTER — Encounter: Payer: Self-pay | Admitting: Nurse Practitioner

## 2021-11-03 VITALS — BP 140/80 | HR 48 | Ht 69.0 in | Wt 202.2 lb

## 2021-11-03 DIAGNOSIS — R001 Bradycardia, unspecified: Secondary | ICD-10-CM | POA: Diagnosis not present

## 2021-11-03 DIAGNOSIS — I10 Essential (primary) hypertension: Secondary | ICD-10-CM | POA: Diagnosis not present

## 2021-11-03 DIAGNOSIS — E663 Overweight: Secondary | ICD-10-CM

## 2021-11-03 DIAGNOSIS — I2510 Atherosclerotic heart disease of native coronary artery without angina pectoris: Secondary | ICD-10-CM | POA: Insufficient documentation

## 2021-11-03 DIAGNOSIS — Z955 Presence of coronary angioplasty implant and graft: Secondary | ICD-10-CM | POA: Insufficient documentation

## 2021-11-03 DIAGNOSIS — I4510 Unspecified right bundle-branch block: Secondary | ICD-10-CM | POA: Diagnosis not present

## 2021-11-03 DIAGNOSIS — E782 Mixed hyperlipidemia: Secondary | ICD-10-CM

## 2021-11-03 LAB — LIPID PANEL
Cholesterol: 121 mg/dL (ref 0–200)
HDL: 37 mg/dL — ABNORMAL LOW (ref >40)
LDL Cholesterol: 61 mg/dL (ref 0–99)
Total CHOL/HDL Ratio: 3.3 RATIO
Triglycerides: 115 mg/dL (ref <150)
VLDL: 23 mg/dL (ref 0–40)

## 2021-11-03 LAB — CBC WITH DIFFERENTIAL/PLATELET
Abs Immature Granulocytes: 0.08 10*3/uL — ABNORMAL HIGH (ref 0.00–0.07)
Basophils Absolute: 0.1 10*3/uL (ref 0.0–0.1)
Basophils Relative: 1 %
Eosinophils Absolute: 0.4 10*3/uL (ref 0.0–0.5)
Eosinophils Relative: 5 %
HCT: 40.9 % (ref 39.0–52.0)
Hemoglobin: 13.5 g/dL (ref 13.0–17.0)
Immature Granulocytes: 1 %
Lymphocytes Relative: 31 %
Lymphs Abs: 2.5 10*3/uL (ref 0.7–4.0)
MCH: 30 pg (ref 26.0–34.0)
MCHC: 33 g/dL (ref 30.0–36.0)
MCV: 90.9 fL (ref 80.0–100.0)
Monocytes Absolute: 0.6 10*3/uL (ref 0.1–1.0)
Monocytes Relative: 7 %
Neutro Abs: 4.4 10*3/uL (ref 1.7–7.7)
Neutrophils Relative %: 55 %
Platelets: 240 10*3/uL (ref 150–400)
RBC: 4.5 MIL/uL (ref 4.22–5.81)
RDW: 13.7 % (ref 11.5–15.5)
WBC: 8 10*3/uL (ref 4.0–10.5)
nRBC: 0 % (ref 0.0–0.2)

## 2021-11-03 LAB — COMPREHENSIVE METABOLIC PANEL
ALT: 17 U/L (ref 0–44)
AST: 18 U/L (ref 15–41)
Albumin: 3.8 g/dL (ref 3.5–5.0)
Alkaline Phosphatase: 59 U/L (ref 38–126)
Anion gap: 8 (ref 5–15)
BUN: 24 mg/dL — ABNORMAL HIGH (ref 8–23)
CO2: 23 mmol/L (ref 22–32)
Calcium: 8.8 mg/dL — ABNORMAL LOW (ref 8.9–10.3)
Chloride: 109 mmol/L (ref 98–111)
Creatinine, Ser: 1.13 mg/dL (ref 0.61–1.24)
GFR, Estimated: >60 mL/min (ref >60)
Glucose, Bld: 111 mg/dL — ABNORMAL HIGH (ref 70–99)
Potassium: 4 mmol/L (ref 3.5–5.1)
Sodium: 140 mmol/L (ref 135–145)
Total Bilirubin: 0.8 mg/dL (ref 0.3–1.2)
Total Protein: 7.4 g/dL (ref 6.5–8.1)

## 2021-11-03 LAB — TSH: TSH: 2.518 u[IU]/mL (ref 0.350–4.500)

## 2021-11-03 MED ORDER — NITROGLYCERIN 0.4 MG SL SUBL: 0.4000 mg | SUBLINGUAL_TABLET | SUBLINGUAL | 0 refills | Status: DC | PRN

## 2021-11-03 NOTE — Patient Instructions (Addendum)
Medication Instructions:    Your physician recommends that you continue on your current medications as directed. Please refer to the Current Medication list given to you today.    *If you need a refill on your cardiac medications before your next appointment, please call your pharmacy*   Lab Work:  Your physician recommends that you return for lab work @ [facility].  -LIPID -CBC W/DIFF -TSH -CMET  If you have labs (blood work) drawn today and your tests are completely normal, you will receive your results only by: [place] (if you have [place]) OR A paper copy in the mail If you have any lab test that is abnormal or we need to change your treatment, we will call you to review the results.   Testing/Procedures:  NONE   Follow-Up: At [facility], you and your health needs are our priority.  As part of our continuing mission to provide you with exceptional heart care, we have created designated Provider Care Teams.  These Care Teams include your primary Cardiologist (physician) and Advanced Practice Providers (APPs -  Physician Assistants and Nurse Practitioners) who all work together to provide you with the care you need, when you need it.  We recommend signing up for the patient portal called "MyChart".  Sign up information is provided on this After Visit Summary.  MyChart is used to connect with patients for Virtual Visits (Telemedicine).  Patients are able to view lab/test results, encounter notes, upcoming appointments, etc.  Non-urgent messages can be sent to your provider as well.   To learn more about what you can do with MyChart, go to [email or web address].    Your next appointment:   12 month(s)  The format for your next appointment:   In Person  Provider:   You may see Nelva Bush, MD or one of the following Advanced Practice Providers on your designated Care Team:   Murray Hodgkins, NP Christell Faith, PA-C Cadence Kathlen Mody,  PA-C Gerrie Nordmann, NP   OTHER INSTRUCTIONS:  Please check your Blood pressure for the next following 2 weeks.  If your systolic Blood pressure is anywhere between 130-140 rang Please reach out to [place] before hand to discuss possible adjustments in medications before your next visit [phone]   Tips to Measure your Blood Pressure Correctly  To determine whether you have hypertension, a medical professional will take a blood pressure reading. How you prepare for the test, the position of your arm, and other factors can change a blood pressure reading by 10% or more. That could be enough to hide high blood pressure, start you on a drug you don't really need, or lead your doctor to incorrectly adjust your medications.  National and international guidelines offer specific instructions for measuring blood pressure. If a doctor, nurse, or medical assistant isn't doing it right, don't hesitate to ask him or her to get with the guidelines.  Here's what you can do to ensure a correct reading:  Don't drink a caffeinated beverage or smoke during the 30 minutes before the test.  Sit quietly for five minutes before the test begins.  During the measurement, sit in a chair with your feet on the floor and your arm supported so your elbow is at about heart level.  The inflatable part of the cuff should completely cover at least 80% of your upper arm, and the cuff should be placed on bare skin, not over a shirt.  Don't talk during the measurement.  Have your  blood pressure measured twice, with a brief break in between. If the readings are different by 5 points or more, have it done a third time.  In 2017, new guidelines from the [place], the [employer] of Cardiology, and nine other health organizations lowered the diagnosis of high blood pressure to 130/80 mm Hg or higher for all adults. The guidelines also redefined the various blood pressure categories to now include normal,  elevated, Stage 1 hypertension, Stage 2 hypertension, and hypertensive crisis (see "Blood pressure categories").  Blood pressure categories  Blood pressure category SYSTOLIC (upper number)  DIASTOLIC (lower number)  Normal Less than 120 mm Hg and Less than 80 mm Hg  Elevated 120-129 mm Hg and Less than 80 mm Hg  High blood pressure: Stage 1 hypertension 130-139 mm Hg or 80-89 mm Hg  High blood pressure: Stage 2 hypertension 140 mm Hg or higher or 90 mm Hg or higher  Hypertensive crisis (consult your doctor immediately) Higher than 180 mm Hg and/or Higher than 120 mm Hg  Source: American Heart Association and American Stroke Association. For more on getting your blood pressure under control, buy Controlling Your Blood Pressure, a Special Health Report from [facility].   Blood Pressure Log   Date   Time  Blood Pressure  Position  Example: Nov 1 9 AM 124/78 sitting                                                       Important Information About Sugar

## 2021-11-16 ENCOUNTER — Other Ambulatory Visit: Payer: PPO

## 2021-11-16 DIAGNOSIS — N400 Enlarged prostate without lower urinary tract symptoms: Secondary | ICD-10-CM

## 2021-11-16 DIAGNOSIS — R972 Elevated prostate specific antigen [PSA]: Secondary | ICD-10-CM | POA: Diagnosis not present

## 2021-11-17 LAB — PSA: Prostate Specific Ag, Serum: 5.5 ng/mL — ABNORMAL HIGH (ref 0.0–4.0)

## 2021-11-20 ENCOUNTER — Other Ambulatory Visit: Payer: Self-pay | Admitting: Family Medicine

## 2021-12-08 ENCOUNTER — Encounter: Payer: Self-pay | Admitting: Emergency Medicine

## 2021-12-08 ENCOUNTER — Ambulatory Visit
Admission: EM | Admit: 2021-12-08 | Discharge: 2021-12-09 | Disposition: A | Discharge status: Home / Self Care | Payer: PPO | Attending: Emergency Medicine | Admitting: Emergency Medicine

## 2021-12-08 ENCOUNTER — Emergency Department: Payer: PPO | Admitting: Anesthesiology

## 2021-12-08 ENCOUNTER — Emergency Department: Payer: PPO

## 2021-12-08 ENCOUNTER — Encounter
Admission: EM | Disposition: A | Discharge status: Home / Self Care | Payer: Self-pay | Source: Home / Self Care | Attending: Emergency Medicine

## 2021-12-08 ENCOUNTER — Other Ambulatory Visit: Payer: Self-pay

## 2021-12-08 DIAGNOSIS — I2510 Atherosclerotic heart disease of native coronary artery without angina pectoris: Secondary | ICD-10-CM | POA: Insufficient documentation

## 2021-12-08 DIAGNOSIS — W44F3XA Food entering into or through a natural orifice, initial encounter: Secondary | ICD-10-CM | POA: Diagnosis not present

## 2021-12-08 DIAGNOSIS — Z6830 Body mass index (BMI) 30.0-30.9, adult: Secondary | ICD-10-CM | POA: Diagnosis not present

## 2021-12-08 DIAGNOSIS — R1310 Dysphagia, unspecified: Secondary | ICD-10-CM | POA: Diagnosis not present

## 2021-12-08 DIAGNOSIS — K2100 Gastro-esophageal reflux disease with esophagitis, without bleeding: Secondary | ICD-10-CM | POA: Diagnosis not present

## 2021-12-08 DIAGNOSIS — E669 Obesity, unspecified: Secondary | ICD-10-CM | POA: Diagnosis not present

## 2021-12-08 DIAGNOSIS — I129 Hypertensive chronic kidney disease with stage 1 through stage 4 chronic kidney disease, or unspecified chronic kidney disease: Secondary | ICD-10-CM | POA: Diagnosis not present

## 2021-12-08 DIAGNOSIS — N183 Chronic kidney disease, stage 3 unspecified: Secondary | ICD-10-CM | POA: Insufficient documentation

## 2021-12-08 DIAGNOSIS — T18128A Food in esophagus causing other injury, initial encounter: Secondary | ICD-10-CM | POA: Insufficient documentation

## 2021-12-08 DIAGNOSIS — I252 Old myocardial infarction: Secondary | ICD-10-CM | POA: Diagnosis not present

## 2021-12-08 DIAGNOSIS — K209 Esophagitis, unspecified without bleeding: Secondary | ICD-10-CM | POA: Diagnosis not present

## 2021-12-08 DIAGNOSIS — T18108A Unspecified foreign body in esophagus causing other injury, initial encounter: Secondary | ICD-10-CM | POA: Diagnosis not present

## 2021-12-08 DIAGNOSIS — E782 Mixed hyperlipidemia: Secondary | ICD-10-CM | POA: Insufficient documentation

## 2021-12-08 HISTORY — PX: ESOPHAGOGASTRODUODENOSCOPY: SHX5428

## 2021-12-08 LAB — COMPREHENSIVE METABOLIC PANEL
ALT: 31 U/L (ref 0–44)
AST: 19 U/L (ref 15–41)
Albumin: 3.9 g/dL (ref 3.5–5.0)
Alkaline Phosphatase: 65 U/L (ref 38–126)
Anion gap: 8 (ref 5–15)
BUN: 24 mg/dL — ABNORMAL HIGH (ref 8–23)
CO2: 27 mmol/L (ref 22–32)
Calcium: 8.8 mg/dL — ABNORMAL LOW (ref 8.9–10.3)
Chloride: 107 mmol/L (ref 98–111)
Creatinine, Ser: 1.53 mg/dL — ABNORMAL HIGH (ref 0.61–1.24)
GFR, Estimated: 49 mL/min — ABNORMAL LOW (ref >60)
Glucose, Bld: 114 mg/dL — ABNORMAL HIGH (ref 70–99)
Potassium: 4.2 mmol/L (ref 3.5–5.1)
Sodium: 142 mmol/L (ref 135–145)
Total Bilirubin: 0.7 mg/dL (ref 0.3–1.2)
Total Protein: 7.9 g/dL (ref 6.5–8.1)

## 2021-12-08 LAB — CBC WITH DIFFERENTIAL/PLATELET
Abs Immature Granulocytes: 0.05 10*3/uL (ref 0.00–0.07)
Basophils Absolute: 0.1 10*3/uL (ref 0.0–0.1)
Basophils Relative: 1 %
Eosinophils Absolute: 0.4 10*3/uL (ref 0.0–0.5)
Eosinophils Relative: 5 %
HCT: 42.8 % (ref 39.0–52.0)
Hemoglobin: 13.6 g/dL (ref 13.0–17.0)
Immature Granulocytes: 1 %
Lymphocytes Relative: 28 %
Lymphs Abs: 2.4 10*3/uL (ref 0.7–4.0)
MCH: 29.5 pg (ref 26.0–34.0)
MCHC: 31.8 g/dL (ref 30.0–36.0)
MCV: 92.8 fL (ref 80.0–100.0)
Monocytes Absolute: 0.8 10*3/uL (ref 0.1–1.0)
Monocytes Relative: 9 %
Neutro Abs: 4.9 10*3/uL (ref 1.7–7.7)
Neutrophils Relative %: 56 %
Platelets: 280 10*3/uL (ref 150–400)
RBC: 4.61 MIL/uL (ref 4.22–5.81)
RDW: 13.8 % (ref 11.5–15.5)
WBC: 8.5 10*3/uL (ref 4.0–10.5)
nRBC: 0 % (ref 0.0–0.2)

## 2021-12-08 SURGERY — EGD (ESOPHAGOGASTRODUODENOSCOPY)
Anesthesia: General

## 2021-12-08 MED ORDER — DEXMEDETOMIDINE HCL IN NACL 200 MCG/50ML IV SOLN
INTRAVENOUS | Status: DC | PRN
  Administered 2021-12-08: 12 ug via INTRAVENOUS

## 2021-12-08 MED ORDER — SODIUM CHLORIDE 0.9 % IV SOLN: INTRAVENOUS | Status: DC | PRN

## 2021-12-08 MED ORDER — LIDOCAINE HCL (CARDIAC) PF 100 MG/5ML IV SOSY
PREFILLED_SYRINGE | INTRAVENOUS | Status: DC | PRN
  Administered 2021-12-08: 100 mg via INTRAVENOUS

## 2021-12-08 MED ORDER — SUCCINYLCHOLINE CHLORIDE 200 MG/10ML IV SOSY
PREFILLED_SYRINGE | INTRAVENOUS | Status: AC
  Filled 2021-12-08: qty 10

## 2021-12-08 MED ORDER — PROPOFOL 10 MG/ML IV BOLUS
INTRAVENOUS | Status: AC
  Filled 2021-12-08: qty 20

## 2021-12-08 MED ORDER — SODIUM CHLORIDE 0.9 % IV SOLN: INTRAVENOUS | Status: DC

## 2021-12-08 MED ORDER — ONDANSETRON HCL 4 MG/2ML IJ SOLN
INTRAMUSCULAR | Status: DC | PRN
  Administered 2021-12-08: 4 mg via INTRAVENOUS

## 2021-12-08 MED ORDER — SUCCINYLCHOLINE CHLORIDE 200 MG/10ML IV SOSY
PREFILLED_SYRINGE | INTRAVENOUS | Status: DC | PRN
  Administered 2021-12-08: 120 mg via INTRAVENOUS

## 2021-12-08 MED ORDER — PROPOFOL 10 MG/ML IV BOLUS
INTRAVENOUS | Status: DC | PRN
  Administered 2021-12-08: 170 mg via INTRAVENOUS
  Administered 2021-12-08: 30 mg via INTRAVENOUS

## 2021-12-08 NOTE — ED Notes (Signed)
Patient transported to CT 

## 2021-12-08 NOTE — ED Provider Notes (Signed)
[facility] Provider Note    Event Date/Time   First MD Initiated Contact with Patient 12/08/21 2112     (approximate)   History   Swallowed Foreign Body   HPI  Kevin Melton is a 71 y.o. male   Past medical history of GERD, CKD, CAD, hypertension hyperlipidemia, multiple episodes of food getting stuck in his throat who presents with food getting stuck in his throat during dinnertime tonight.  Had a sensation of food impaction in the upper chest and has been unable to tolerate liquids or solids since then.  When he drinks, he feels that building up in his chest and then he regurgitates.  He has had multiple episodes prior, 1 time resolved with medications and has never had a procedure to remove impaction.    He was otherwise in his regular state of health, denies coughing or trouble breathing, and denies abdominal pain. History was obtained via the patient and his wife who is at bedside. History also reviewed including upper endoscopy dated 11/04/2019 for follow-up of eosinophilic esophagitis that showed some esophagitis.      Physical Exam   Triage Vital Signs: ED Triage Vitals  Enc Vitals Group     BP 12/08/21 2109 (!) 140/81     Pulse Rate 12/08/21 2109 79     Resp 12/08/21 2109 20     Temp 12/08/21 2109 97.7 F (36.5 C)     Temp Source 12/08/21 2109 Axillary     SpO2 12/08/21 2109 96 %     Weight 12/08/21 2107 200 lb (90.7 kg)     Height 12/08/21 2107 '5\' 8"'$  (1.727 m)     Head Circumference --      Peak Flow --      Pain Score 12/08/21 2107 6     Pain Loc --      Pain Edu? --      Excl. in [place]? --     Most recent vital signs: Vitals:   12/08/21 2130 12/08/21 2200  BP: (!) 160/75 129/66  Pulse: 66 62  Resp:    Temp:    SpO2: 100% 100%    General: Awake, no distress.  CV:  Good peripheral perfusion.  Resp:  Normal effort. Lungs clear breathing comfortably.  Abd:  No distention. nontender Other:  Spitting into bag  intermittently    ED Results / Procedures / Treatments   Labs (all labs ordered are listed, but only abnormal results are displayed) Labs Reviewed  COMPREHENSIVE METABOLIC PANEL - Abnormal; Notable for the following components:      Result Value   Glucose, Bld 114 (*)    BUN 24 (*)    Creatinine, Ser 1.53 (*)    Calcium 8.8 (*)    GFR, Estimated 49 (*)    All other components within normal limits  CBC WITH DIFFERENTIAL/PLATELET     I reviewed labs and they are notable for  Cr 1.5   RADIOLOGY I independently reviewed and interpreted the x-ray and see no radiopaque foreign body or mediastinal air   PROCEDURES:  Critical Care performed: No  Procedures   MEDICATIONS ORDERED IN ED: Medications - No data to display  Consultants:  I spoke with GI consultant Dr. Alice Reichert regarding care plan for this patient.   IMPRESSION / MDM / ASSESSMENT AND PLAN / ED COURSE  I reviewed the triage vital signs and the nursing notes.  Differential diagnosis includes, but is not limited to food impaction and this patient can no longer tolerate p.o. intake and regurgitates with history of food impaction in the past.  Had been responsive to glucagon in the past, I spoke with GI consultant Dr. Alice Reichert regarding management, and he will scope the patient tonight.   Patient's presentation is most consistent with acute presentation with potential threat to life or bodily function.       FINAL CLINICAL IMPRESSION(S) / ED DIAGNOSES   Final diagnoses:  Food impaction of esophagus, initial encounter     Rx / DC Orders   ED Discharge Orders     None        Note:  This document was prepared using Dragon voice recognition software and may include unintentional dictation errors.    Lucillie Garfinkel, MD 12/08/21 2214

## 2021-12-08 NOTE — Consult Note (Signed)
[facility] GI Inpatient Consult Note   Kathline Magic, M.D.  Reason for Consult: Esophageal food impaction, dysphagia   Attending Requesting Consult: Lucillie Garfinkel, M.D.   History of Present Illness: Kevin Melton is a 71 y.o. male presenting after eating rotisserie chicken earlier today and "having a problem getting it down". Patient has a long history of esophagitis and has reportedly undergone numerous endoscopies with foreign body removal and biopsies of the esophagus.  He currently denies taking any anticoagulants. He wears dentures but it is not known if these are ideal with which to chew. His last upper endoscopy with biopsy of "esophagitis" in Aug 2021 by Dr. Marius Ditch revealed features of reflux esophagitis without eosinophils or metaplasia/dysplasia.  Past Medical History:  Past Medical History:  Diagnosis Date   Abdominal hernia    umbilical   Chronic kidney disease (CKD), active medical management without dialysis    Chronic kidney disease, stage III (moderate) ([place])    Coronary artery disease    a. 2013 Inf MI/PCI: BMS to RCA; b. 11/2013 MV: small reversible apical anteroseptal defect/ischemia. EF 60%; c. 11/2020 MV: small, fixed basal inf defect - scar vs artifact. No ischemia. EF 45-50% (60-65% by echo).   Diastolic dysfunction    a. 11/2020 Echo: EF 60-65%, no rwma, mild LVH. GrI DD, nl RV fxn, mild MR/AI.   GERD (gastroesophageal reflux disease)    History of kidney stones    h/o   Hyperlipidemia    Hypertension    Kidney stones    MI (myocardial infarction) ([place]) 2013   1 stent placed-sees Dr End(cardiologist) last seen in nov 2018   RBBB     Problem List: Patient Active Problem List   Diagnosis Date Noted   Urinary retention 10/18/2021   Liver cyst 02/19/2021   Acute pain of right shoulder 10/29/2020   RBBB (right bundle branch block) 10/14/2020   Bradycardia 10/14/2020   Overweight 04/27/2020   Elevated PSA 11/05/2019   BPH without  obstruction/lower urinary tract symptoms 11/05/2019   Prediabetes 10/29/2019   Dyshidrotic eczema 54/62/7035   Eosinophilic esophagitis 00/93/8182   Seborrheic keratoses 09/18/2017   CKD (chronic kidney disease) stage 2, GFR 60-89 ml/min 03/21/2017   Coronary artery disease involving native coronary artery of native heart without angina pectoris 01/12/2017   H/O acute myocardial infarction 11/10/2016   Dysphagia 99/37/1696   Umbilical hernia 78/93/8101   Essential hypertension with goal blood pressure less than 130/80 09/04/2014   Mixed hyperlipidemia 09/04/2014   Gout 11/05/2013    Past Surgical History: Past Surgical History:  Procedure Laterality Date   COLONOSCOPY  2013   CORONARY ANGIOPLASTY WITH STENT PLACEMENT  2013   [place]   ESOPHAGOGASTRODUODENOSCOPY (EGD) WITH PROPOFOL N/A 01/05/2017   Procedure: ESOPHAGOGASTRODUODENOSCOPY (EGD) WITH PROPOFOL With Dilation;  Surgeon: Lin Landsman, MD;  Location: [place];  Service: Gastroenterology;  Laterality: N/A;   ESOPHAGOGASTRODUODENOSCOPY (EGD) WITH PROPOFOL N/A 07/03/2019   Procedure: ESOPHAGOGASTRODUODENOSCOPY (EGD) WITH PROPOFOL;  Surgeon: Lin Landsman, MD;  Location: [facility] ENDOSCOPY;  Service: Gastroenterology;  Laterality: N/A;   ESOPHAGOGASTRODUODENOSCOPY (EGD) WITH PROPOFOL N/A 11/04/2019   Procedure: ESOPHAGOGASTRODUODENOSCOPY (EGD) WITH PROPOFOL;  Surgeon: Lin Landsman, MD;  Location: [facility] ENDOSCOPY;  Service: Gastroenterology;  Laterality: N/A;   HERNIA REPAIR     LITHOTRIPSY  7510   UMBILICAL HERNIA REPAIR N/A 02/20/2017   Primary repair 2 cm umbilical hernia;  Surgeon: Robert Bellow, MD;  Location: [facility] ORS;  Service: General;  Laterality:  N/A;    Allergies: No Known Allergies  Home Medications: (Not in a hospital admission)  Home medication reconciliation was completed with the patient.   Scheduled Inpatient Medications:    Continuous Inpatient Infusions:    PRN Inpatient  Medications:    Family History: family history includes Dementia (age of onset: 52) in his mother; Healthy in his brother, sister, sister, and son; Heart Problems in his brother; Lung disease (age of onset: 41) in his father.   GI Family History: Some GERD in family members.  Social History:   reports that he has never smoked. He has never used smokeless tobacco. He reports that he does not drink alcohol and does not use drugs. The patient denies ETOH, tobacco, or drug use.    Review of Systems: Review of Systems - Negative except HPI  Physical Examination: BP 129/66   Pulse 62   Temp 98 F (36.7 C) (Oral)   Resp 20   Ht '5\' 8"'$  (1.727 m)   Wt 90.7 kg   SpO2 100%   BMI 30.41 kg/m  Physical Exam Constitutional:      Appearance: Normal appearance. He is obese. He is not toxic-appearing.  HENT:     Head: Normocephalic and atraumatic.     Mouth/Throat:     Pharynx: Oropharynx is clear.  Eyes:     General: No scleral icterus.    Pupils: Pupils are equal, round, and reactive to light.  Cardiovascular:     Rate and Rhythm: Normal rate.     Heart sounds: No murmur heard.    No gallop.  Pulmonary:     Effort: Pulmonary effort is normal. No respiratory distress.     Breath sounds: No stridor. No rhonchi.  Abdominal:     Palpations: Abdomen is soft.     Tenderness: There is no abdominal tenderness. There is no rebound.     Hernia: No hernia is present.  Musculoskeletal:        General: Normal range of motion.     Cervical back: Normal range of motion.  Skin:    General: Skin is warm and dry.  Neurological:     General: No focal deficit present.     Mental Status: He is alert.  Psychiatric:        Mood and Affect: Mood normal.        Thought Content: Thought content normal.        Judgment: Judgment normal.     Data: Lab Results  Component Value Date   WBC 8.5 12/08/2021   HGB 13.6 12/08/2021   HCT 42.8 12/08/2021   MCV 92.8 12/08/2021   PLT 280 12/08/2021    Recent Labs  Lab 12/08/21 2116  HGB 13.6   Lab Results  Component Value Date   NA 142 12/08/2021   K 4.2 12/08/2021   CL 107 12/08/2021   CO2 27 12/08/2021   BUN 24 (H) 12/08/2021   CREATININE 1.53 (H) 12/08/2021   Lab Results  Component Value Date   ALT 31 12/08/2021   AST 19 12/08/2021   ALKPHOS 65 12/08/2021   BILITOT 0.7 12/08/2021   No results for input(s): "APTT", "INR", "PTT" in the last 168 hours.    Latest Ref Rng & Units 12/08/2021    9:16 PM 11/03/2021   12:07 PM 02/12/2019   11:08 AM  CBC  WBC 4.0 - 10.5 K/uL 8.5  8.0  7.6   Hemoglobin 13.0 - 17.0 g/dL 13.6  13.5  14.9  Hematocrit 39.0 - 52.0 % 42.8  40.9  42.9   Platelets 150 - 400 K/uL 280  240  245     STUDIES: DG Chest 2 View  Result Date: 12/08/2021 CLINICAL DATA:  Dysphagia following eating chicken, possible retained bone, initial encounter EXAM: CHEST - 2 VIEW COMPARISON:  12/23/2017 FINDINGS: Cardiac shadow is within normal limits. The lungs are clear. No radiopaque density to suggest ingested bone is identified. No bony abnormality is seen. IMPRESSION: No active cardiopulmonary disease. Electronically Signed   By: Inez Catalina M.D.   On: 12/08/2021 21:59   DG Neck Soft Tissue  Result Date: 12/08/2021 CLINICAL DATA:  Dysphagia, question stuck chicken bone. EXAM: NECK SOFT TISSUES - 1+ VIEW COMPARISON:  None Available. FINDINGS: There is no evidence of retropharyngeal soft tissue swelling or epiglottic enlargement. The cervical airway is unremarkable and no radio-opaque foreign body identified. IMPRESSION: Negative. Electronically Signed   By: Ronney Asters M.D.   On: 12/08/2021 21:58   '@IMAGES'$ @  Assessment:  Acute esophageal obstruction secondary to presumed food impaction. Dysphagia, as above. History of reflux esophagitis. Possible poor mastication secondary to edentulous state, leading to #1 above.    Recommendations: IV fluids. EGD with General anesthesia. Will attempt foreign body  removal with or without biopsy and/or esophageal dilaton.The patient understands the nature of the planned procedure, indications, risks, alternatives and potential complications including but not limited to bleeding, infection, perforation, damage to internal organs and possible oversedation/side effects from anesthesia. The patient agrees and gives consent to proceed.  Please refer to procedure notes for findings, recommendations and patient disposition/instructions.  Thank you for the consult. Please call with questions or concerns.  Olean Ree, "Lanny Hurst MD [facility] [place], [place] [address] [phone]  12/08/2021 10:24 PM

## 2021-12-08 NOTE — Op Note (Signed)
[facility] Gastroenterology Patient Name: Kevin Melton Procedure Date: 44/11/6757 10:47 PM MRN: [id number] Account #: [id number] Date of Birth: [birth year] Admit Type: Inpatient Age: 71 Room: [facility] ENDO ROOM 4 Gender: Male Note Status: Finalized Instrument Name: Upper Endoscope [id number] Procedure:             Upper GI endoscopy Indications:           Removal of foreign body in the esophagus, Dysphagia Providers:             Benay Pike. Alice Reichert MD, MD Referring MD:          Dionne Bucy. Bacigalupo (Referring MD) Medicines:             Propofol per Anesthesia Complications:         No immediate complications. Procedure:             Pre-Anesthesia Assessment:                        - The risks and benefits of the procedure and the                         sedation options and risks were discussed with the                         patient. All questions were answered and informed                         consent was obtained.                        - Patient identification and proposed procedure were                         verified prior to the procedure by the nurse. The                         procedure was verified in the procedure room.                        - ASA Grade Assessment: III - A patient with severe                         systemic disease.                        - After reviewing the risks and benefits, the patient                         was deemed in satisfactory condition to undergo the                         procedure.                        After obtaining informed consent, the endoscope was                         passed under direct vision. Throughout the procedure,  the patient's blood pressure, pulse, and oxygen                         saturations were monitored continuously. The Endoscope                         was introduced through the mouth, and advanced to the                         third part of duodenum. The upper GI  endoscopy was                         technically difficult and complex due to presence of                         food. Successful completion of the procedure was aided                         by performing the maneuvers documented (below) in this                         report. The patient tolerated the procedure well. Findings:      Food was found in the middle third of the esophagus. Removal of food was       accomplished. No biopsies or other specimens were collected for this       exam.      LA Grade B (one or more mucosal breaks greater than 5 mm, not extending       between the tops of two mucosal folds) esophagitis with no bleeding was       found in the distal esophagus.      The stomach was normal.      The examined duodenum was normal. Impression:            - Food in the middle third of the esophagus. No                         specimens collected. Removal was successful.                        - LA Grade B reflux esophagitis with no bleeding.                        - Normal stomach.                        - Normal examined duodenum. Recommendation:        - Patient has a contact number available for                         emergencies. The signs and symptoms of potential                         delayed complications were discussed with the patient.                         Return to normal activities tomorrow. Written  discharge instructions were provided to the patient.                        - Full liquid diet for 3 days.                        - Continue present medications.                        - Avoid eating solid food if not wearing dentures. Opt                         for pureed food, full liquids, clear liquids.                        Recurrent foreign body impactions are likely if                         instructions are not followed and dentures are not                         worn when eating solid food.                        - Use  Prilosec (omeprazole) 20 mg PO daily.                        - Return to my office PRN.                        - The findings and recommendations were discussed with                         the patient. Procedure Code(s):     --- Professional ---                        [phone], Esophagogastroduodenoscopy, flexible,                         transoral; with removal of foreign body(s) Diagnosis Code(s):     --- Professional ---                        R13.10, Dysphagia, unspecified                        T18.108A, Unspecified foreign body in esophagus                         causing other injury, initial encounter                        K21.00, Gastro-esophageal reflux disease with                         esophagitis, without bleeding                        T18.128A, Food in esophagus causing other injury,  initial encounter CPT copyright 2019 American Medical Association. All rights reserved. The codes documented in this report are preliminary and upon coder review may  be revised to meet current compliance requirements. Efrain Sella MD, MD 12/08/2021 11:35:10 PM This report has been signed electronically. Number of Addenda: 0 Note Initiated On: 12/08/2021 10:47 PM Estimated Blood Loss:  Estimated blood loss: none.      [facility]

## 2021-12-08 NOTE — Transfer of Care (Signed)
Immediate Anesthesia Transfer of Care Note  Patient: Kevin Melton  Procedure(s) Performed: ESOPHAGOGASTRODUODENOSCOPY (EGD)  Patient Location: PACU and Endoscopy Unit  Anesthesia Type:General  Level of Consciousness: drowsy and patient cooperative  Airway & Oxygen Therapy: Patient Spontanous Breathing and Patient connected to nasal cannula oxygen  Post-op Assessment: Report given to RN and Post -op Vital signs reviewed and stable  Post vital signs: Reviewed and stable  Last Vitals:  Vitals Value Taken Time  BP 115/59 12/08/21 2337  Temp 35.9 C 12/08/21 2337  Pulse 58 12/08/21 2337  Resp 19 12/08/21 2337  SpO2 92 % 12/08/21 2337    Last Pain:  Vitals:   12/08/21 2337  TempSrc: Temporal  PainSc: Asleep         Complications: No notable events documented.

## 2021-12-08 NOTE — Discharge Instructions (Signed)
Begin Prilosec OTC one pill daily before breakfast. Do not eat solid food without dentures. If eating without dentures, opt for pureed food from blender, full liquids, clear liquids. Follow up with GI as needed

## 2021-12-08 NOTE — ED Notes (Addendum)
Pt taken via wheelchair to GI for scope now.

## 2021-12-08 NOTE — ED Triage Notes (Signed)
Pt to ED from home c/o feeling like something stuck in throat.  States was eating chicken tonight, has had this happen before and given medicine to help relax muscles and pass object.  Denies procedural intervention.  Spitting up saliva at home and in triage.  No difficulty speaking at this time and denies SOB or chest pain.

## 2021-12-08 NOTE — Anesthesia Preprocedure Evaluation (Addendum)
Anesthesia Evaluation  Patient identified by MRN, date of birth, ID band Patient awake    Reviewed: Allergy & Precautions, H&P , NPO status , Patient's Chart, lab work & pertinent test results  History of Anesthesia Complications Negative for: history of anesthetic complications  Airway Mallampati: III  TM Distance: <3 FB Neck ROM: limited    Dental  (+) Edentulous Upper, Edentulous Lower   Pulmonary neg shortness of breath,    Pulmonary exam normal        Cardiovascular Exercise Tolerance: Good hypertension, (-) angina+ CAD, + Past MI and + Cardiac Stents  (-) DOE Normal cardiovascular exam+ dysrhythmias (RBBB)      Neuro/Psych negative neurological ROS  negative psych ROS   GI/Hepatic Neg liver ROS, GERD  Medicated and Controlled,food bolus   Endo/Other  negative endocrine ROS  Renal/GU CRFRenal disease  negative genitourinary   Musculoskeletal   Abdominal (+) + obese,   Peds  Hematology negative hematology ROS (+)   Anesthesia Other Findings Past Medical History: No date: Abdominal hernia     Comment:  umbilical No date: Chronic kidney disease (CKD), active medical management  without dialysis No date: Chronic kidney disease, stage III (moderate) No date: Coronary artery disease No date: GERD (gastroesophageal reflux disease) No date: History of kidney stones     Comment:  h/o No date: Kidney stones 2013: MI (myocardial infarction) ([place])     Comment:  1 stent placed-sees Dr End(cardiologist) last seen in               nov 2018  Past Surgical History: 2013: COLONOSCOPY 2013: CORONARY ANGIOPLASTY WITH STENT PLACEMENT     Comment:  [place] 01/05/2017: ESOPHAGOGASTRODUODENOSCOPY (EGD) WITH PROPOFOL; N/A     Comment:  Procedure: ESOPHAGOGASTRODUODENOSCOPY (EGD) WITH               PROPOFOL With Dilation;  Surgeon: Lin Landsman,               MD;  Location: [facility] ENDOSCOPY;  Service:                Gastroenterology;  Laterality: N/A; 07/03/2019: ESOPHAGOGASTRODUODENOSCOPY (EGD) WITH PROPOFOL; N/A     Comment:  Procedure: ESOPHAGOGASTRODUODENOSCOPY (EGD) WITH               PROPOFOL;  Surgeon: Lin Landsman, MD;  Location:               [facility] ENDOSCOPY;  Service: Gastroenterology;  Laterality:               N/A; No date: HERNIA REPAIR 2014: LITHOTRIPSY 97/04/6376: UMBILICAL HERNIA REPAIR; N/A     Comment:  Primary repair 2 cm umbilical hernia;  Surgeon: Robert Bellow, MD;  Location: [facility] ORS;  Service: General;                Laterality: N/A;  BMI    Body Mass Index: 30.27 kg/m      Reproductive/Obstetrics negative OB ROS                            Anesthesia Physical  Anesthesia Plan  ASA: III  Anesthesia Plan: General   Post-op Pain Management:    Induction: Intravenous and Rapid sequence  PONV Risk Score and Plan: 2 and Ondansetron and Treatment may vary due to age or medical condition  Airway Management  Planned: Oral ETT  Additional Equipment:   Intra-op Plan:   Post-operative Plan: Extubation in OR  Informed Consent: I have reviewed the patients History and Physical, chart, labs and discussed the procedure including the risks, benefits and alternatives for the proposed anesthesia with the patient or authorized representative who has indicated his/her understanding and acceptance.     Dental Advisory Given  Plan Discussed with: Anesthesiologist, CRNA and Surgeon  Anesthesia Plan Comments: (Patient consented for risks of anesthesia including but not limited to:  - adverse reactions to medications - risk of intubation if required - damage to eyes, teeth, lips or other oral mucosa - nerve damage due to positioning  - sore throat or hoarseness - Damage to heart, brain, nerves, lungs, other parts of body or loss of life  Patient voiced understanding.)       Anesthesia Quick Evaluation

## 2021-12-08 NOTE — Anesthesia Procedure Notes (Signed)
Procedure Name: Intubation Date/Time: 12/08/2021 10:52 PM  Performed by: Jonna Clark, CRNAPre-anesthesia Checklist: Patient identified, Patient being monitored, Timeout performed, Emergency Drugs available and Suction available Patient Re-evaluated:Patient Re-evaluated prior to induction Oxygen Delivery Method: Circle system utilized Preoxygenation: Pre-oxygenation with 100% oxygen Induction Type: IV induction Ventilation: Mask ventilation without difficulty Laryngoscope Size: McGraph and 4 Grade View: Grade I Tube type: Oral Tube size: 7.0 mm Number of attempts: 1 Airway Equipment and Method: Stylet Placement Confirmation: ETT inserted through vocal cords under direct vision, positive ETCO2 and breath sounds checked- equal and bilateral Secured at: 21 cm Tube secured with: Tape Dental Injury: Teeth and Oropharynx as per pre-operative assessment

## 2021-12-09 ENCOUNTER — Encounter: Payer: Self-pay | Admitting: Internal Medicine

## 2021-12-09 NOTE — Anesthesia Postprocedure Evaluation (Signed)
Anesthesia Post Note  Patient: Kevin Melton  Procedure(s) Performed: ESOPHAGOGASTRODUODENOSCOPY (EGD)  Patient location during evaluation: PACU Anesthesia Type: General Level of consciousness: awake and alert Pain management: pain level controlled Vital Signs Assessment: post-procedure vital signs reviewed and stable Respiratory status: spontaneous breathing, nonlabored ventilation and respiratory function stable Cardiovascular status: blood pressure returned to baseline and stable Postop Assessment: no apparent nausea or vomiting Anesthetic complications: no   No notable events documented.   Last Vitals:  Vitals:   12/08/21 2347 12/08/21 2357  BP: (!) 110/54 (!) 110/57  Pulse: (!) 55 (!) 56  Resp: 17 18  Temp:    SpO2: 96% 95%    Last Pain:  Vitals:   12/08/21 2357  TempSrc:   PainSc: 0-No pain                 Iran Ouch

## 2021-12-09 NOTE — Progress Notes (Signed)
Patient discharged via wheelchair in spouse's care to home

## 2021-12-21 ENCOUNTER — Ambulatory Visit
Admission: RE | Admit: 2021-12-21 | Discharge: 2021-12-21 | Disposition: A | Discharge status: Home / Self Care | Payer: PPO | Source: Ambulatory Visit | Attending: Family Medicine | Admitting: Family Medicine

## 2021-12-21 DIAGNOSIS — K7689 Other specified diseases of liver: Secondary | ICD-10-CM | POA: Insufficient documentation

## 2022-01-05 ENCOUNTER — Other Ambulatory Visit: Payer: Self-pay | Admitting: Family Medicine

## 2022-01-05 DIAGNOSIS — L821 Other seborrheic keratosis: Secondary | ICD-10-CM | POA: Diagnosis not present

## 2022-01-05 DIAGNOSIS — Z85820 Personal history of malignant melanoma of skin: Secondary | ICD-10-CM | POA: Diagnosis not present

## 2022-01-05 DIAGNOSIS — D2261 Melanocytic nevi of right upper limb, including shoulder: Secondary | ICD-10-CM | POA: Diagnosis not present

## 2022-01-05 DIAGNOSIS — D2271 Melanocytic nevi of right lower limb, including hip: Secondary | ICD-10-CM | POA: Diagnosis not present

## 2022-01-05 DIAGNOSIS — D2262 Melanocytic nevi of left upper limb, including shoulder: Secondary | ICD-10-CM | POA: Diagnosis not present

## 2022-01-05 DIAGNOSIS — D2272 Melanocytic nevi of left lower limb, including hip: Secondary | ICD-10-CM | POA: Diagnosis not present

## 2022-01-05 DIAGNOSIS — D225 Melanocytic nevi of trunk: Secondary | ICD-10-CM | POA: Diagnosis not present

## 2022-02-09 DIAGNOSIS — R339 Retention of urine, unspecified: Secondary | ICD-10-CM | POA: Diagnosis not present

## 2022-02-24 ENCOUNTER — Encounter: Payer: Self-pay | Admitting: Urology

## 2022-02-28 ENCOUNTER — Encounter: Payer: Self-pay | Admitting: Emergency Medicine

## 2022-02-28 ENCOUNTER — Emergency Department
Admission: EM | Admit: 2022-02-28 | Discharge: 2022-02-28 | Disposition: A | Discharge status: Home / Self Care | Payer: PPO | Attending: Emergency Medicine | Admitting: Emergency Medicine

## 2022-02-28 ENCOUNTER — Other Ambulatory Visit: Payer: Self-pay

## 2022-02-28 DIAGNOSIS — R319 Hematuria, unspecified: Secondary | ICD-10-CM

## 2022-02-28 DIAGNOSIS — I2510 Atherosclerotic heart disease of native coronary artery without angina pectoris: Secondary | ICD-10-CM | POA: Insufficient documentation

## 2022-02-28 DIAGNOSIS — N3090 Cystitis, unspecified without hematuria: Secondary | ICD-10-CM

## 2022-02-28 DIAGNOSIS — N3001 Acute cystitis with hematuria: Secondary | ICD-10-CM | POA: Diagnosis not present

## 2022-02-28 DIAGNOSIS — N182 Chronic kidney disease, stage 2 (mild): Secondary | ICD-10-CM | POA: Insufficient documentation

## 2022-02-28 LAB — URINALYSIS, ROUTINE W REFLEX MICROSCOPIC
Specific Gravity, Urine: 1.018 (ref 1.005–1.030)
Squamous Epithelial / HPF: NONE SEEN (ref 0–5)
WBC, UA: >50 WBC/hpf — ABNORMAL HIGH (ref 0–5)

## 2022-02-28 LAB — CBC
HCT: 45.3 % (ref 39.0–52.0)
Hemoglobin: 14.7 g/dL (ref 13.0–17.0)
MCH: 30.2 pg (ref 26.0–34.0)
MCHC: 32.5 g/dL (ref 30.0–36.0)
MCV: 93 fL (ref 80.0–100.0)
Platelets: 229 10*3/uL (ref 150–400)
RBC: 4.87 MIL/uL (ref 4.22–5.81)
RDW: 13.6 % (ref 11.5–15.5)
WBC: 8.5 10*3/uL (ref 4.0–10.5)
nRBC: 0 % (ref 0.0–0.2)

## 2022-02-28 LAB — BASIC METABOLIC PANEL
Anion gap: 5 (ref 5–15)
BUN: 21 mg/dL (ref 8–23)
CO2: 25 mmol/L (ref 22–32)
Calcium: 8.9 mg/dL (ref 8.9–10.3)
Chloride: 111 mmol/L (ref 98–111)
Creatinine, Ser: 1.31 mg/dL — ABNORMAL HIGH (ref 0.61–1.24)
GFR, Estimated: 59 mL/min — ABNORMAL LOW (ref >60)
Glucose, Bld: 133 mg/dL — ABNORMAL HIGH (ref 70–99)
Potassium: 4.2 mmol/L (ref 3.5–5.1)
Sodium: 141 mmol/L (ref 135–145)

## 2022-02-28 MED ORDER — CEFDINIR 300 MG PO CAPS: 300.0000 mg | ORAL_CAPSULE | Freq: Two times a day (BID) | ORAL | 0 refills | Status: AC

## 2022-02-28 NOTE — Discharge Instructions (Addendum)
Please take the antibiotic as prescribed.  Please follow-up with your urologist as you have scheduled.  Please return for any new, worsening, or change in symptoms or other concerns.  It was a pleasure caring for you today.

## 2022-02-28 NOTE — ED Provider Notes (Signed)
[facility] Provider Note    None    (approximate)   History   Hematuria and Back Pain   HPI  Kevin Melton is a 71 y.o. male with a past medical history of of urinary retention and self caths at home who presents today with blood in his urine.  Patient reported that the last time he self cath he noticed blood in his urine.  He reports that he has urinary urgency, and low back pain.  He made an appointment with his urologist but is unable to see his urologist until 5 January.  He denies any fevers or chills.  No nausea or vomiting.  Patient Active Problem List   Diagnosis Date Noted   Urinary retention 10/18/2021   Liver cyst 02/19/2021   Acute pain of right shoulder 10/29/2020   RBBB (right bundle branch block) 10/14/2020   Bradycardia 10/14/2020   Overweight 04/27/2020   Elevated PSA 11/05/2019   BPH without obstruction/lower urinary tract symptoms 11/05/2019   Prediabetes 10/29/2019   Dyshidrotic eczema 31/54/0086   Eosinophilic esophagitis 76/19/5093   Seborrheic keratoses 09/18/2017   CKD (chronic kidney disease) stage 2, GFR 60-89 ml/min 03/21/2017   Coronary artery disease involving native coronary artery of native heart without angina pectoris 01/12/2017   H/O acute myocardial infarction 11/10/2016   Dysphagia 26/71/2458   Umbilical hernia 09/98/3382   Essential hypertension with goal blood pressure less than 130/80 09/04/2014   Mixed hyperlipidemia 09/04/2014   Gout 11/05/2013          Physical Exam   Triage Vital Signs: ED Triage Vitals  Enc Vitals Group     BP 02/28/22 0625 (!) 140/74     Pulse Rate 02/28/22 0625 (!) 50     Resp 02/28/22 0625 (!) 21     Temp 02/28/22 0625 97.7 F (36.5 C)     Temp Source 02/28/22 0625 Oral     SpO2 02/28/22 0625 95 %     Weight 02/28/22 0624 200 lb (90.7 kg)     Height 02/28/22 0624 '5\' 9"'$  (1.753 m)     Head Circumference --      Peak Flow --      Pain Score 02/28/22 0624 8      Pain Loc --      Pain Edu? --      Excl. in [place]? --     Most recent vital signs: Vitals:   02/28/22 0625 02/28/22 0934  BP: (!) 140/74 (!) 171/73  Pulse: (!) 50 (!) 56  Resp: (!) 21 18  Temp: 97.7 F (36.5 C) 98 F (36.7 C)  SpO2: 95% 98%    Physical Exam Vitals and nursing note reviewed.  Constitutional:      General: Awake and alert. No acute distress.    Appearance: Normal appearance. The patient is normal weight.  HENT:     Head: Normocephalic and atraumatic.     Mouth: Mucous membranes are moist.  Eyes:     General: PERRL. Normal EOMs        Right eye: No discharge.        Left eye: No discharge.     Conjunctiva/sclera: Conjunctivae normal.  Cardiovascular:     Rate and Rhythm: Normal rate and regular rhythm.     Pulses: Normal pulses.  Pulmonary:     Effort: Pulmonary effort is normal. No respiratory distress.     Breath sounds: Normal breath sounds.  Abdominal:     Abdomen  is soft. There is no abdominal tenderness. No rebound or guarding. No distention.  No CVA tenderness Musculoskeletal:        General: No swelling. Normal range of motion.     Cervical back: Normal range of motion and neck supple.  Skin:    General: Skin is warm and dry.     Capillary Refill: Capillary refill takes less than 2 seconds.     Findings: No rash.  Neurological:     Mental Status: The patient is awake and alert.      ED Results / Procedures / Treatments   Labs (all labs ordered are listed, but only abnormal results are displayed) Labs Reviewed  URINALYSIS, ROUTINE W REFLEX MICROSCOPIC - Abnormal; Notable for the following components:      Result Value   Color, Urine ORANGE (*)    APPearance CLOUDY (*)    Glucose, UA   (*)    Value: TEST NOT REPORTED DUE TO COLOR INTERFERENCE OF URINE PIGMENT   Hgb urine dipstick   (*)    Value: TEST NOT REPORTED DUE TO COLOR INTERFERENCE OF URINE PIGMENT   Bilirubin Urine   (*)    Value: TEST NOT REPORTED DUE TO COLOR INTERFERENCE OF  URINE PIGMENT   Ketones, ur   (*)    Value: TEST NOT REPORTED DUE TO COLOR INTERFERENCE OF URINE PIGMENT   Protein, ur   (*)    Value: TEST NOT REPORTED DUE TO COLOR INTERFERENCE OF URINE PIGMENT   Nitrite   (*)    Value: TEST NOT REPORTED DUE TO COLOR INTERFERENCE OF URINE PIGMENT   Leukocytes,Ua   (*)    Value: TEST NOT REPORTED DUE TO COLOR INTERFERENCE OF URINE PIGMENT   WBC, UA >50 (*)    Bacteria, UA MANY (*)    All other components within normal limits  BASIC METABOLIC PANEL - Abnormal; Notable for the following components:   Glucose, Bld 133 (*)    Creatinine, Ser 1.31 (*)    GFR, Estimated 59 (*)    All other components within normal limits  CBC     EKG     RADIOLOGY     PROCEDURES:  Critical Care performed:   Procedures   MEDICATIONS ORDERED IN ED: Medications - No data to display   IMPRESSION / MDM / [place] / ED COURSE  I reviewed the triage vital signs and the nursing notes.   Differential diagnosis includes, but is not limited to, hemorrhagic cystitis, pyelonephritis, kidney stone.  Patient is awake and alert, hemodynamically stable and afebrile.  He has no unilateral flank pain or history of kidney stones to suggest kidney stone today.  He has no CVA tenderness or fever on exam to suggest pyelonephritis.  His labs obtained in triage demonstrate a baseline creatinine.  His urine was difficult for the lab to interpret due to the blood, however he has both white blood cells and bacteria noted.  His symptoms of urgency, suprapubic discomfort and bacteria and white blood cells in his urine is suggestive of cystitis.  He was started on cefdinir.  He was instructed to follow-up with his urologist as scheduled, though we discussed strict return precautions to the ER in the meantime.  Patient and his family understand and agree with plan.  He was discharged in stable condition.   Patient's presentation is most consistent with acute complicated  illness / injury requiring diagnostic workup.     FINAL CLINICAL IMPRESSION(S) / ED DIAGNOSES  Final diagnoses:  Cystitis  Hematuria, unspecified type     Rx / DC Orders   ED Discharge Orders          Ordered    cefdinir (OMNICEF) 300 MG capsule  2 times daily        02/28/22 8727             Note:  This document was prepared using Dragon voice recognition software and may include unintentional dictation errors.   Emeline Gins 02/28/22 1255    Rada Hay, MD 02/28/22 [phone]

## 2022-02-28 NOTE — ED Triage Notes (Signed)
Pt to ED via POV, states self-cath's at home, states last time he self cath'd he noticed blood in his urine. Pt c/o increasing lower back pain at this time.

## 2022-03-08 ENCOUNTER — Telehealth: Payer: Self-pay

## 2022-03-08 NOTE — Telephone Encounter (Signed)
        Patient  visited [facility] on 12/25    Telephone encounter attempt : 1st  A HIPAA compliant voice message was left requesting a return call.  Instructed patient to call back .    [place], Care Management  [phone] [address], [place], [place] [address] Phone: [phone] Email: [email or web address]

## 2022-03-09 ENCOUNTER — Telehealth: Payer: Self-pay

## 2022-03-09 NOTE — Telephone Encounter (Signed)
        Patient  visited [facility] on 12/25   Telephone encounter attempt :  2nd  A HIPAA compliant voice message was left requesting a return call.  Instructed patient to call back     [place], [place] [facility]  [phone] [address], [place], [place] [address] Phone: [phone] Email: [email or web address]

## 2022-03-11 ENCOUNTER — Ambulatory Visit (INDEPENDENT_AMBULATORY_CARE_PROVIDER_SITE_OTHER): Payer: PPO | Admitting: Physician Assistant

## 2022-03-11 VITALS — BP 138/70 | HR 58 | Ht 69.0 in

## 2022-03-11 DIAGNOSIS — N3001 Acute cystitis with hematuria: Secondary | ICD-10-CM | POA: Diagnosis not present

## 2022-03-11 DIAGNOSIS — R300 Dysuria: Secondary | ICD-10-CM | POA: Diagnosis not present

## 2022-03-11 LAB — URINALYSIS, COMPLETE
Bilirubin, UA: NEGATIVE
Glucose, UA: NEGATIVE
Ketones, UA: NEGATIVE
Leukocytes,UA: NEGATIVE
Nitrite, UA: NEGATIVE
Protein,UA: NEGATIVE
RBC, UA: NEGATIVE
Specific Gravity, UA: 1.02 (ref 1.005–1.030)
Urobilinogen, Ur: 0.2 mg/dL (ref 0.2–1.0)
pH, UA: 5 (ref 5.0–7.5)

## 2022-03-11 LAB — MICROSCOPIC EXAMINATION

## 2022-03-11 MED ORDER — OXYBUTYNIN CHLORIDE 5 MG PO TABS: 5.0000 mg | ORAL_TABLET | Freq: Three times a day (TID) | ORAL | 2 refills | Status: AC | PRN

## 2022-03-11 NOTE — Progress Notes (Signed)
03/11/2022 11:02 AM   Kevin Melton [birth year] [id number]  CC: Chief Complaint  Patient presents with   Abdominal Pain    Lower, f/u from ER visit   HPI: Kevin Melton is a 72 y.o. male with PMH BPH, nephrolithiasis, and acontractile bladder managed with [facility] who presents today for ED follow-up.   He was seen in the emergency department [place] days ago.  He reports progressive onset of bladder pressure, cloudy urine, bladder pain/dysuria, and gross hematuria prior to proceeding to the ED.  His urine appeared grossly infected with >50 WBCs/hpf, 6-10 RBCs/hpf, many bacteria, and WBC clumps.  He was prescribed empiric cefdinir twice daily x 10 days, however urine culture was never sent.  Today he reports he completed antibiotics last night with near resolution of his symptoms.  He continues to have some left low back pain that was not responsive to ibuprofen, however he took oxybutynin and this made an approximate 50% improvement.  He is now out of oxybutynin.  In-office UA and microscopy today pan negative.  PMH: Past Medical History:  Diagnosis Date   Abdominal hernia    umbilical   Chronic kidney disease (CKD), active medical management without dialysis    Chronic kidney disease, stage III (moderate) ([place])    Coronary artery disease    a. 2013 Inf MI/PCI: BMS to RCA; b. 11/2013 MV: small reversible apical anteroseptal defect/ischemia. EF 60%; c. 11/2020 MV: small, fixed basal inf defect - scar vs artifact. No ischemia. EF 45-50% (60-65% by echo).   Diastolic dysfunction    a. 11/2020 Echo: EF 60-65%, no rwma, mild LVH. GrI DD, nl RV fxn, mild MR/AI.   GERD (gastroesophageal reflux disease)    History of kidney stones    h/o   Hyperlipidemia    Hypertension    Kidney stones    MI (myocardial infarction) [facility]) 2013   1 stent placed-sees Dr End(cardiologist) last seen in nov 2018   RBBB     Surgical History: Past Surgical History:  Procedure Laterality Date    COLONOSCOPY  2013   CORONARY ANGIOPLASTY WITH STENT PLACEMENT  2013   [place]   ESOPHAGOGASTRODUODENOSCOPY N/A 12/08/2021   Procedure: ESOPHAGOGASTRODUODENOSCOPY (EGD);  Surgeon: Toledo, Benay Pike, MD;  Location: [facility] ENDOSCOPY;  Service: Gastroenterology;  Laterality: N/A;   ESOPHAGOGASTRODUODENOSCOPY (EGD) WITH PROPOFOL N/A 01/05/2017   Procedure: ESOPHAGOGASTRODUODENOSCOPY (EGD) WITH PROPOFOL With Dilation;  Surgeon: Lin Landsman, MD;  Location: [facility] ENDOSCOPY;  Service: Gastroenterology;  Laterality: N/A;   ESOPHAGOGASTRODUODENOSCOPY (EGD) WITH PROPOFOL N/A 07/03/2019   Procedure: ESOPHAGOGASTRODUODENOSCOPY (EGD) WITH PROPOFOL;  Surgeon: Lin Landsman, MD;  Location: [facility] ENDOSCOPY;  Service: Gastroenterology;  Laterality: N/A;   ESOPHAGOGASTRODUODENOSCOPY (EGD) WITH PROPOFOL N/A 11/04/2019   Procedure: ESOPHAGOGASTRODUODENOSCOPY (EGD) WITH PROPOFOL;  Surgeon: Lin Landsman, MD;  Location: [facility] ENDOSCOPY;  Service: Gastroenterology;  Laterality: N/A;   HERNIA REPAIR     LITHOTRIPSY  2536   UMBILICAL HERNIA REPAIR N/A 02/20/2017   Primary repair 2 cm umbilical hernia;  Surgeon: Robert Bellow, MD;  Location: [facility] ORS;  Service: General;  Laterality: N/A;    Home Medications:  Allergies as of 03/11/2022   No Known Allergies      Medication List        Accurate as of March 11, 2022 11:02 AM. If you have any questions, ask your nurse or doctor.          allopurinol 300 MG tablet Commonly known as: ZYLOPRIM TAKE 1 TABLET BY  MOUTH EVERYDAY AT BEDTIME   amLODipine 10 MG tablet Commonly known as: NORVASC TAKE 1 TABLET BY MOUTH EVERY DAY   aspirin 81 MG chewable tablet Chew 81 mg by mouth at bedtime.   atorvastatin 40 MG tablet Commonly known as: LIPITOR TAKE 1 TABLET BY MOUTH EVERY DAY   benazepril 40 MG tablet Commonly known as: LOTENSIN TAKE 1 TABLET BY MOUTH EVERY DAY   finasteride 5 MG tablet Commonly known as: PROSCAR Take 1 tablet (5 mg  total) by mouth daily.   nitroGLYCERIN 0.4 MG SL tablet Commonly known as: NITROSTAT Place 1 tablet (0.4 mg total) under the tongue every 5 (five) minutes as needed for chest pain.   oxybutynin 5 MG tablet Commonly known as: DITROPAN Take 1 tablet (5 mg total) by mouth every 8 (eight) hours as needed for bladder spasms.        Allergies:  No Known Allergies  Family History: Family History  Problem Relation Age of Onset   Dementia Mother 73   Lung disease Father 51       collapsed lung   Healthy Sister    Healthy Brother    Heart Problems Brother    Healthy Son    Healthy Sister    Colon cancer Neg Hx    Prostate cancer Neg Hx    Heart disease Neg Hx     Social History:   reports that he has never smoked. He has never used smokeless tobacco. He reports that he does not drink alcohol and does not use drugs.  Physical Exam: BP 138/70   Pulse (!) 58   Ht '5\' 9"'$  (1.753 m)   BMI 29.53 kg/m   Constitutional:  Alert and oriented, no acute distress, nontoxic appearing HEENT: [place], AT Cardiovascular: No clubbing, cyanosis, or edema Respiratory: Normal respiratory effort, no increased work of breathing Skin: No rashes, bruises or suspicious lesions Neurologic: Grossly intact, no focal deficits, moving all 4 extremities Psychiatric: Normal mood and affect  Laboratory Data: Results for orders placed or performed in visit on 03/11/22  Microscopic Examination   Urine  Result Value Ref Range   WBC, UA 0-5 0 - 5 /hpf   RBC, Urine 0-2 0 - 2 /hpf   Epithelial Cells (non renal) 0-10 0 - 10 /hpf   Bacteria, UA Few None seen/Few  Urinalysis, Complete  Result Value Ref Range   Specific Gravity, UA 1.020 1.005 - 1.030   pH, UA 5.0 5.0 - 7.5   Color, UA Yellow Yellow   Appearance Ur Clear Clear   Leukocytes,UA Negative Negative   Protein,UA Negative Negative/Trace   Glucose, UA Negative Negative   Ketones, UA Negative Negative   RBC, UA Negative Negative   Bilirubin, UA  Negative Negative   Urobilinogen, Ur 0.2 0.2 - 1.0 mg/dL   Nitrite, UA Negative Negative   Microscopic Examination See below:    Assessment & Plan:   1. Acute cystitis with hematuria His UA is bland today after completing 10 days of cefdinir, though no culture data available to determine which organism he grew.  Will refill oxybutynin to have on hand.  With his persistent left low back pain responsive to oxybutynin, I will obtain a renal ultrasound to rule out underlying stone episode, though my suspicion for this is low in the absence of hematuria or any other renal colic symptoms.  Will call with results. - Urinalysis, Complete - oxybutynin (DITROPAN) 5 MG tablet; Take 1 tablet (5 mg total) by mouth every  8 (eight) hours as needed for bladder spasms.  Dispense: 30 tablet; Refill: 2 - US RENAL; Future  Return for will call with results.  Debroah Loop, PA-C  [facility] [address] [place], [place] [address] [phone]

## 2022-03-11 NOTE — H&P (View-Only) (Signed)
03/11/2022 11:02 AM   [name] [birth year] [id number]  CC: Chief Complaint  Patient presents with   Abdominal Pain    Lower, f/u from ER visit   HPI: Kevin Melton is a 72 y.o. male with PMH BPH, nephrolithiasis, and acontractile bladder managed with [facility] who presents today for ED follow-up.   He was seen in the emergency department [place] days ago.  He reports progressive onset of bladder pressure, cloudy urine, bladder pain/dysuria, and gross hematuria prior to proceeding to the ED.  His urine appeared grossly infected with >50 WBCs/hpf, 6-10 RBCs/hpf, many bacteria, and WBC clumps.  He was prescribed empiric cefdinir twice daily x 10 days, however urine culture was never sent.  Today he reports he completed antibiotics last night with near resolution of his symptoms.  He continues to have some left low back pain that was not responsive to ibuprofen, however he took oxybutynin and this made an approximate 50% improvement.  He is now out of oxybutynin.  In-office UA and microscopy today pan negative.  PMH: Past Medical History:  Diagnosis Date   Abdominal hernia    umbilical   Chronic kidney disease (CKD), active medical management without dialysis    Chronic kidney disease, stage III (moderate) ([place])    Coronary artery disease    a. 2013 Inf MI/PCI: BMS to RCA; b. 11/2013 MV: small reversible apical anteroseptal defect/ischemia. EF 60%; c. 11/2020 MV: small, fixed basal inf defect - scar vs artifact. No ischemia. EF 45-50% (60-65% by echo).   Diastolic dysfunction    a. 11/2020 Echo: EF 60-65%, no rwma, mild LVH. GrI DD, nl RV fxn, mild MR/AI.   GERD (gastroesophageal reflux disease)    History of kidney stones    h/o   Hyperlipidemia    Hypertension    Kidney stones    MI (myocardial infarction) [facility]) 2013   1 stent placed-sees Dr End(cardiologist) last seen in nov 2018   RBBB     Surgical History: Past Surgical History:  Procedure Laterality Date    COLONOSCOPY  2013   CORONARY ANGIOPLASTY WITH STENT PLACEMENT  2013   [place]   ESOPHAGOGASTRODUODENOSCOPY N/A 12/08/2021   Procedure: ESOPHAGOGASTRODUODENOSCOPY (EGD);  Surgeon: Toledo, Benay Pike, MD;  Location: [facility] ENDOSCOPY;  Service: Gastroenterology;  Laterality: N/A;   ESOPHAGOGASTRODUODENOSCOPY (EGD) WITH PROPOFOL N/A 01/05/2017   Procedure: ESOPHAGOGASTRODUODENOSCOPY (EGD) WITH PROPOFOL With Dilation;  Surgeon: Lin Landsman, MD;  Location: [facility] ENDOSCOPY;  Service: Gastroenterology;  Laterality: N/A;   ESOPHAGOGASTRODUODENOSCOPY (EGD) WITH PROPOFOL N/A 07/03/2019   Procedure: ESOPHAGOGASTRODUODENOSCOPY (EGD) WITH PROPOFOL;  Surgeon: Lin Landsman, MD;  Location: [facility] ENDOSCOPY;  Service: Gastroenterology;  Laterality: N/A;   ESOPHAGOGASTRODUODENOSCOPY (EGD) WITH PROPOFOL N/A 11/04/2019   Procedure: ESOPHAGOGASTRODUODENOSCOPY (EGD) WITH PROPOFOL;  Surgeon: Lin Landsman, MD;  Location: [facility] ENDOSCOPY;  Service: Gastroenterology;  Laterality: N/A;   HERNIA REPAIR     LITHOTRIPSY  0017   UMBILICAL HERNIA REPAIR N/A 02/20/2017   Primary repair 2 cm umbilical hernia;  Surgeon: Robert Bellow, MD;  Location: [facility] ORS;  Service: General;  Laterality: N/A;    Home Medications:  Allergies as of 03/11/2022   No Known Allergies      Medication List        Accurate as of March 11, 2022 11:02 AM. If you have any questions, ask your nurse or doctor.          allopurinol 300 MG tablet Commonly known as: ZYLOPRIM TAKE 1 TABLET BY  MOUTH EVERYDAY AT BEDTIME   amLODipine 10 MG tablet Commonly known as: NORVASC TAKE 1 TABLET BY MOUTH EVERY DAY   aspirin 81 MG chewable tablet Chew 81 mg by mouth at bedtime.   atorvastatin 40 MG tablet Commonly known as: LIPITOR TAKE 1 TABLET BY MOUTH EVERY DAY   benazepril 40 MG tablet Commonly known as: LOTENSIN TAKE 1 TABLET BY MOUTH EVERY DAY   finasteride 5 MG tablet Commonly known as: PROSCAR Take 1 tablet (5 mg  total) by mouth daily.   nitroGLYCERIN 0.4 MG SL tablet Commonly known as: NITROSTAT Place 1 tablet (0.4 mg total) under the tongue every 5 (five) minutes as needed for chest pain.   oxybutynin 5 MG tablet Commonly known as: DITROPAN Take 1 tablet (5 mg total) by mouth every 8 (eight) hours as needed for bladder spasms.        Allergies:  No Known Allergies  Family History: Family History  Problem Relation Age of Onset   Dementia Mother 56   Lung disease Father 57       collapsed lung   Healthy Sister    Healthy Brother    Heart Problems Brother    Healthy Son    Healthy Sister    Colon cancer Neg Hx    Prostate cancer Neg Hx    Heart disease Neg Hx     Social History:   reports that he has never smoked. He has never used smokeless tobacco. He reports that he does not drink alcohol and does not use drugs.  Physical Exam: BP 138/70   Pulse (!) 58   Ht '5\' 9"'$  (1.753 m)   BMI 29.53 kg/m   Constitutional:  Alert and oriented, no acute distress, nontoxic appearing HEENT: , AT Cardiovascular: No clubbing, cyanosis, or edema Respiratory: Normal respiratory effort, no increased work of breathing Skin: No rashes, bruises or suspicious lesions Neurologic: Grossly intact, no focal deficits, moving all 4 extremities Psychiatric: Normal mood and affect  Laboratory Data: Results for orders placed or performed in visit on 03/11/22  Microscopic Examination   Urine  Result Value Ref Range   WBC, UA 0-5 0 - 5 /hpf   RBC, Urine 0-2 0 - 2 /hpf   Epithelial Cells (non renal) 0-10 0 - 10 /hpf   Bacteria, UA Few None seen/Few  Urinalysis, Complete  Result Value Ref Range   Specific Gravity, UA 1.020 1.005 - 1.030   pH, UA 5.0 5.0 - 7.5   Color, UA Yellow Yellow   Appearance Ur Clear Clear   Leukocytes,UA Negative Negative   Protein,UA Negative Negative/Trace   Glucose, UA Negative Negative   Ketones, UA Negative Negative   RBC, UA Negative Negative   Bilirubin, UA  Negative Negative   Urobilinogen, Ur 0.2 0.2 - 1.0 mg/dL   Nitrite, UA Negative Negative   Microscopic Examination See below:    Assessment & Plan:   1. Acute cystitis with hematuria His UA is bland today after completing 10 days of cefdinir, though no culture data available to determine which organism he grew.  Will refill oxybutynin to have on hand.  With his persistent left low back pain responsive to oxybutynin, I will obtain a renal ultrasound to rule out underlying stone episode, though my suspicion for this is low in the absence of hematuria or any other renal colic symptoms.  Will call with results. - Urinalysis, Complete - oxybutynin (DITROPAN) 5 MG tablet; Take 1 tablet (5 mg total) by mouth every  8 (eight) hours as needed for bladder spasms.  Dispense: 30 tablet; Refill: 2 - US RENAL; Future  Return for will call with results.  Debroah Loop, PA-C  [facility] [address] [place], [place] [address] [phone]

## 2022-03-15 ENCOUNTER — Ambulatory Visit
Admission: RE | Admit: 2022-03-15 | Discharge: 2022-03-15 | Disposition: A | Discharge status: Home / Self Care | Payer: PPO | Source: Ambulatory Visit | Attending: Physician Assistant | Admitting: Physician Assistant

## 2022-03-15 DIAGNOSIS — N134 Hydroureter: Secondary | ICD-10-CM | POA: Diagnosis not present

## 2022-03-15 DIAGNOSIS — N1330 Unspecified hydronephrosis: Secondary | ICD-10-CM | POA: Diagnosis not present

## 2022-03-15 DIAGNOSIS — N3001 Acute cystitis with hematuria: Secondary | ICD-10-CM | POA: Diagnosis not present

## 2022-03-16 ENCOUNTER — Other Ambulatory Visit: Payer: Self-pay | Admitting: *Deleted

## 2022-03-16 DIAGNOSIS — N3001 Acute cystitis with hematuria: Secondary | ICD-10-CM

## 2022-03-17 ENCOUNTER — Other Ambulatory Visit: Payer: Self-pay | Admitting: Physician Assistant

## 2022-03-17 NOTE — Addendum Note (Signed)
Addended by: Mickle Plumb on: 03/17/2022 05:02 PM   Modules accepted: Orders

## 2022-03-18 ENCOUNTER — Ambulatory Visit (HOSPITAL_COMMUNITY): Payer: PPO

## 2022-03-18 ENCOUNTER — Ambulatory Visit
Admission: RE | Admit: 2022-03-18 | Discharge: 2022-03-18 | Disposition: A | Discharge status: Home / Self Care | Payer: PPO | Source: Ambulatory Visit | Attending: Physician Assistant | Admitting: Physician Assistant

## 2022-03-18 ENCOUNTER — Ambulatory Visit (INDEPENDENT_AMBULATORY_CARE_PROVIDER_SITE_OTHER): Payer: PPO | Admitting: Physician Assistant

## 2022-03-18 ENCOUNTER — Other Ambulatory Visit: Payer: Self-pay | Admitting: Physician Assistant

## 2022-03-18 VITALS — BP 143/62 | HR 58 | Temp 98.5°F | Wt 208.0 lb

## 2022-03-18 DIAGNOSIS — R339 Retention of urine, unspecified: Secondary | ICD-10-CM

## 2022-03-18 DIAGNOSIS — R935 Abnormal findings on diagnostic imaging of other abdominal regions, including retroperitoneum: Secondary | ICD-10-CM | POA: Diagnosis not present

## 2022-03-18 DIAGNOSIS — N201 Calculus of ureter: Secondary | ICD-10-CM

## 2022-03-18 DIAGNOSIS — N1330 Unspecified hydronephrosis: Secondary | ICD-10-CM | POA: Diagnosis not present

## 2022-03-18 DIAGNOSIS — N134 Hydroureter: Secondary | ICD-10-CM | POA: Diagnosis not present

## 2022-03-18 DIAGNOSIS — N3001 Acute cystitis with hematuria: Secondary | ICD-10-CM | POA: Diagnosis not present

## 2022-03-18 LAB — URINALYSIS, COMPLETE
Bilirubin, UA: NEGATIVE
Glucose, UA: NEGATIVE
Ketones, UA: NEGATIVE
Nitrite, UA: NEGATIVE
Protein,UA: NEGATIVE
Specific Gravity, UA: <1.005 — ABNORMAL LOW (ref 1.005–1.030)
Urobilinogen, Ur: 0.2 mg/dL (ref 0.2–1.0)
pH, UA: 5.5 (ref 5.0–7.5)

## 2022-03-18 LAB — MICROSCOPIC EXAMINATION: WBC, UA: >30 /hpf — AB (ref 0–5)

## 2022-03-18 MED ORDER — OXYCODONE-ACETAMINOPHEN 5-325 MG PO TABS: 1.0000 | ORAL_TABLET | Freq: Four times a day (QID) | ORAL | 0 refills | Status: AC | PRN

## 2022-03-18 MED ORDER — IOHEXOL 300 MG/ML  SOLN
125.0000 mL | Freq: Once | INTRAMUSCULAR | Status: AC | PRN
  Administered 2022-03-18: 125 mL via INTRAVENOUS

## 2022-03-18 MED ORDER — LEVOFLOXACIN 500 MG PO TABS: 500.0000 mg | ORAL_TABLET | Freq: Every day | ORAL | 0 refills | Status: AC

## 2022-03-18 MED ORDER — CEFTRIAXONE SODIUM 1 G IJ SOLR
1.0000 g | Freq: Once | INTRAMUSCULAR | Status: AC
  Administered 2022-03-18: 1 g via INTRAMUSCULAR

## 2022-03-18 MED ORDER — ONDANSETRON 4 MG PO TBDP: 4.0000 mg | ORAL_TABLET | Freq: Three times a day (TID) | ORAL | 0 refills | Status: DC | PRN

## 2022-03-18 NOTE — Progress Notes (Unsigned)
Surgical Physician Order Form [facility] [place]  Dr. Bernardo Heater * Scheduling expectation :  03/29/2022  *Length of Case:   *Clearance needed: yes, Dr. Saunders Revel  *Anticoagulation Instructions: N/A  *Aspirin Instructions: Hold Aspirin  *Post-op visit Date/Instructions:   TBD  *Diagnosis:  Right ureteral stone, left hydroureter  *Procedure: Cystoscopy, right ureteroscopy with laser lithotripsy and stent placement, left diagnostic ureteroscopy with possible bladder versus ureter biopsy  Additional orders: N/A  -Admit type: OUTpatient  -Anesthesia: General  -VTE Prophylaxis Standing Order SCD's       Other:   -Standing Lab Orders Per Anesthesia    Lab other: None  -Standing Test orders EKG/Chest x-ray per Anesthesia       Test other:   - Medications:   TBD per preop ucx  -Other orders:  N/A

## 2022-03-18 NOTE — Patient Instructions (Addendum)
You have signs of a urinary tract infection that is moving up your left ureter. You got a shot of antibiotics today and I placed a Foley catheter so we can drain all the infected urine out of your bladder and kidneys.  I'm scheduling you for surgery with Dr. Bernardo Heater on Tuesday, 1/23, to both treat your right kidney stone and assess any abnormalities in your left ureter. If he sees anything that is abnormal during surgery, he will biopsy it.  In the meantime, please do the following: -Stay well hydrated -Treat any pain with ibuprofen/tylenol or Percocet as prescribed today -Treat any nausea with Zofran as prescribed today  Please call our office immediately (we are open 8a-5p Monday-Friday) or go to the Emergency Department if you develop any of the following: -Fever/chills -Nausea and/or vomiting uncontrollable with Zofran -Pain uncontrollable with Percocet

## 2022-03-18 NOTE — Progress Notes (Signed)
03/18/2022 1:08 PM   Kevin Melton [birth year] [id number]  CC: Chief Complaint  Patient presents with   Follow-up   HPI: Kevin Melton is a 72 y.o. male with PMH BPH, nephrolithiasis, and acontractile bladder managed with [facility] who presents today for follow-up of an abnormal stat CT urogram.  He is accompanied today by his wife, who contributes to HPI.  Recent history as follows: -02/28/2022: ED visit with bladder pressure, cloudy urine, bladder pain/dysuria, and gross hematuria.  UA grossly infected, prescribed empiric cefdinir 300 mg twice daily x 10 days, no culture sent. -03/11/2022: ED follow-up with me.  Some persistent left low back pain responsive to oxybutynin at that time.  UA bland.  Renal ultrasound ordered for possible stone. -03/15/2022: Renal ultrasound with new mild to moderate left hydroureteronephrosis.  Offered nonemergent CT urogram for further evaluation given mild symptoms when I saw him, however he subsequently reported increased left flank pain so study was expedited. -This morning: Stat CT urogram with pertinent findings as below:  -Borderline right hydronephrosis with a 7 mm proximal right ureteral stone  -Left renal scarring, calyceal blunting, left hydroureter, and diffuse left urothelial enhancement without stone.  Distal left ureteral thickening extending to the left UVJ.  Diffuse bladder wall thickening.  Bladder wall enhancement around the small caliber left UVJ.  Overall most consistent with possible infection/ureteritis, though differential also includes UVJ stricture or tumor.  -Filling defects in the left collecting system without enhancement consistent with blood clots, fungus balls, or sloughed papillae from papillary necrosis.  Today he reports intermittent bilateral flank discomfort that has progressively worsened over the week.  He denies fever, chills, nausea, or vomiting.  In-office UA today positive for 1+ blood and 3+ leukocytes; urine  microscopy with >30 WBCs/HPF, 3-10 RBCs/HPF, and many bacteria.  PMH: Past Medical History:  Diagnosis Date   Abdominal hernia    umbilical   Chronic kidney disease (CKD), active medical management without dialysis    Chronic kidney disease, stage III (moderate) ([place])    Coronary artery disease    a. 2013 Inf MI/PCI: BMS to RCA; b. 11/2013 MV: small reversible apical anteroseptal defect/ischemia. EF 60%; c. 11/2020 MV: small, fixed basal inf defect - scar vs artifact. No ischemia. EF 45-50% (60-65% by echo).   Diastolic dysfunction    a. 11/2020 Echo: EF 60-65%, no rwma, mild LVH. GrI DD, nl RV fxn, mild MR/AI.   GERD (gastroesophageal reflux disease)    History of kidney stones    h/o   Hyperlipidemia    Hypertension    Kidney stones    MI (myocardial infarction) [facility]) 2013   1 stent placed-sees Dr End(cardiologist) last seen in nov 2018   RBBB     Surgical History: Past Surgical History:  Procedure Laterality Date   COLONOSCOPY  2013   CORONARY ANGIOPLASTY WITH STENT PLACEMENT  2013   [place]   ESOPHAGOGASTRODUODENOSCOPY N/A 12/08/2021   Procedure: ESOPHAGOGASTRODUODENOSCOPY (EGD);  Surgeon: [place], Benay Pike, MD;  Location: [facility] ENDOSCOPY;  Service: Gastroenterology;  Laterality: N/A;   ESOPHAGOGASTRODUODENOSCOPY (EGD) WITH PROPOFOL N/A 01/05/2017   Procedure: ESOPHAGOGASTRODUODENOSCOPY (EGD) WITH PROPOFOL With Dilation;  Surgeon: Lin Landsman, MD;  Location: [facility] ENDOSCOPY;  Service: Gastroenterology;  Laterality: N/A;   ESOPHAGOGASTRODUODENOSCOPY (EGD) WITH PROPOFOL N/A 07/03/2019   Procedure: ESOPHAGOGASTRODUODENOSCOPY (EGD) WITH PROPOFOL;  Surgeon: Lin Landsman, MD;  Location: [facility] ENDOSCOPY;  Service: Gastroenterology;  Laterality: N/A;   ESOPHAGOGASTRODUODENOSCOPY (EGD) WITH PROPOFOL N/A 11/04/2019   Procedure: ESOPHAGOGASTRODUODENOSCOPY (EGD)  WITH PROPOFOL;  Surgeon: Lin Landsman, MD;  Location: [facility] ENDOSCOPY;  Service: Gastroenterology;  Laterality:  N/A;   HERNIA REPAIR     LITHOTRIPSY  [id number]   UMBILICAL HERNIA REPAIR N/A 02/20/2017   Primary repair 2 cm umbilical hernia;  Surgeon: Robert Bellow, MD;  Location: [facility] ORS;  Service: General;  Laterality: N/A;    Home Medications:  Allergies as of 03/18/2022   No Known Allergies      Medication List        Accurate as of March 18, 2022  1:08 PM. If you have any questions, ask your nurse or doctor.          allopurinol 300 MG tablet Commonly known as: ZYLOPRIM TAKE 1 TABLET BY MOUTH EVERYDAY AT BEDTIME   amLODipine 10 MG tablet Commonly known as: NORVASC TAKE 1 TABLET BY MOUTH EVERY DAY   aspirin 81 MG chewable tablet Chew 81 mg by mouth at bedtime.   atorvastatin 40 MG tablet Commonly known as: LIPITOR TAKE 1 TABLET BY MOUTH EVERY DAY   benazepril 40 MG tablet Commonly known as: LOTENSIN TAKE 1 TABLET BY MOUTH EVERY DAY   finasteride 5 MG tablet Commonly known as: PROSCAR Take 1 tablet (5 mg total) by mouth daily.   nitroGLYCERIN 0.4 MG SL tablet Commonly known as: NITROSTAT Place 1 tablet (0.4 mg total) under the tongue every 5 (five) minutes as needed for chest pain.   oxybutynin 5 MG tablet Commonly known as: DITROPAN Take 1 tablet (5 mg total) by mouth every 8 (eight) hours as needed for bladder spasms.        Allergies:  No Known Allergies  Family History: Family History  Problem Relation Age of Onset   Dementia Mother 65   Lung disease Father 85       collapsed lung   Healthy Sister    Healthy Brother    Heart Problems Brother    Healthy Son    Healthy Sister    Colon cancer Neg Hx    Prostate cancer Neg Hx    Heart disease Neg Hx     Social History:   reports that he has never smoked. He has never used smokeless tobacco. He reports that he does not drink alcohol and does not use drugs.  Physical Exam: BP (!) 143/62   Pulse (!) 58   Temp 98.5 F (36.9 C) (Oral)   Wt 208 lb (94.3 kg)   SpO2 95%   BMI 30.72 kg/m    Constitutional:  Alert and oriented, no acute distress, nontoxic appearing HEENT: , AT Cardiovascular: No clubbing, cyanosis, or edema Respiratory: Normal respiratory effort, no increased work of breathing Skin: No rashes, bruises or suspicious lesions Neurologic: Grossly intact, no focal deficits, moving all 4 extremities Psychiatric: Normal mood and affect  Laboratory Data: Results for orders placed or performed in visit on 03/18/22  Microscopic Examination   Urine  Result Value Ref Range   WBC, UA >30 (A) 0 - 5 /hpf   RBC, Urine 3-10 (A) 0 - 2 /hpf   Epithelial Cells (non renal) 0-10 0 - 10 /hpf   Bacteria, UA Many (A) None seen/Few  Urinalysis, Complete  Result Value Ref Range   Specific Gravity, UA <1.005 (L) 1.005 - 1.030   pH, UA 5.5 5.0 - 7.5   Color, UA Yellow Yellow   Appearance Ur Cloudy (A) Clear   Leukocytes,UA 3+ (A) Negative   Protein,UA Negative Negative/Trace   Glucose, UA  Negative Negative   Ketones, UA Negative Negative   RBC, UA 1+ (A) Negative   Bilirubin, UA Negative Negative   Urobilinogen, Ur 0.2 0.2 - 1.0 mg/dL   Nitrite, UA Negative Negative   Microscopic Examination See below:    Pertinent Imaging: Results for orders placed during the hospital encounter of 03/18/22  CT HEMATURIA WORKUP  Narrative CLINICAL DATA:  Hematuria, neurogenic bladder and nephrolithiasis, left hydronephrosis on renal ultrasound of 03/15/2022.  EXAM: CT ABDOMEN AND PELVIS WITHOUT AND WITH CONTRAST  TECHNIQUE: Multidetector CT imaging of the abdomen and pelvis was performed following the standard protocol before and following the bolus administration of intravenous contrast.  RADIATION DOSE REDUCTION: This exam was performed according to the departmental dose-optimization program which includes automated exposure control, adjustment of the mA and/or kV according to patient size and/or use of iterative reconstruction technique.  CONTRAST:  124m OMNIPAQUE  IOHEXOL 300 MG/ML  SOLN  COMPARISON:  Ultrasound examinations from 03/15/2022 and 12/21/2021  FINDINGS: Lower chest: Descending thoracic aortic and right coronary artery atherosclerosis. Trace amount of non dependent gas in the right ventricle, most likely introduced by IV, clinically inconsequential in the absence of a right to left cardiac shunt.  Mild distal esophageal wall thickening is nonspecific but esophagitis would the most common potential cause. Mild cardiomegaly.  Hepatobiliary: Sharply defined 2.2 by 1.8 cm simple cyst in segment 8 of the liver on image 18 series 9. Contracted gallbladder. No biliary dilatation.  Pancreas: Unremarkable  Spleen: Unremarkable  Adrenals/Urinary Tract: Borderline right hydronephrosis associated with a 0.7 cm proximal ureteral calculus (image 96, series 5).  Substantial left renal scarring, caliceal blunting, mild left hydronephrosis, substantial left hydroureter, and diffuse left urothelial enhancement noted with left hydroureter extending to the somewhat thickened left UVJ wall. No left UVJ stone is seen. There is diffuse enhancement and thickening of the distal 1.6 cm of the left ureter extending to the UVJ.  Abnormal filling defects in the left collecting system for example along the inferior pole of the kidney on image 144 series 22, and along the upper pole on image 140 series 22, without associated observed enhancement, raising the possibility of blood clots, fungus balls, or sloughed papillae from papillary necrosis.  Nondistended urinary bladder with diffuse wall thickening compatible with cystitis.  Stomach/Bowel: Sigmoid colon diverticulosis.  Vascular/Lymphatic: Atherosclerosis is present, including aortoiliac atherosclerotic disease.  Reproductive: Irregular margination of the prostate gland which measures about 4.7 by 4.6 by 4.7 cm (volume = 53 cm^3), compatible with mild prostatomegaly. Cannot exclude  prostatitis.  Other: No supplemental non-categorized findings.  Musculoskeletal: Sacralized L5. Schmorl's node along the inferior endplate of L3. Mild left foraminal impingement at L4-5 due to facet spurring.  IMPRESSION: 1. Borderline right hydronephrosis associated with a 0.7 cm proximal right ureteral calculus. 2. Substantial left renal scarring, caliceal blunting, substantial left hydroureter, and diffuse left urothelial enhancement without stone identified. There is also diffuse thickening and enhancement of the distal 1.6 cm of the left ureter extending to the left UVJ. There is also diffuse wall thickening in the urinary bladder compatible with cystitis, and small caliber of the distal left ureter at the UVJ with associated wall enhancement. The appearance raises concern for possible infection/ureteritis and possibly chronic left UVJ obstruction due to stricture. UVJ tumor not totally excluded although inflammatory etiology is favored. 3. Abnormal filling defects in the left collecting system without associated enhancement, raising the possibility of blood clots, fungus balls, or sloughed papillae from papillary necrosis. 4. Mild prostatomegaly.  Cannot exclude prostatitis. 5. Mild distal esophageal wall thickening is nonspecific but esophagitis would the most common potential cause. 6. Mild left foraminal impingement at L4-5 due to facet spurring. 7. Mild cardiomegaly. Coronary atherosclerosis. 8. Sigmoid colon diverticulosis.  Aortic Atherosclerosis (ICD10-I70.0).   Electronically Signed By: Van Clines M.D. On: 03/18/2022 11:20  No results found for this or any previous visit.  I personally reviewed the images referenced above and agree with findings as above.  Simple Catheter Placement  Due to urinary retention patient is present today for a foley cath placement.  Patient was cleaned and prepped in a sterile fashion with betadine and 2% lidocaine jelly was  instilled into the urethra. A 16 FR coud foley catheter was inserted, urine return was noted  271m, urine was yellow in color.  The balloon was filled with 10cc of sterile water.  A leg bag was attached for drainage. Patient was also given a night bag to take home.  Patient tolerated well, no complications were noted   Performed by: SDebroah Loop PA-C   Assessment & Plan:   72year old male with PMH BPH, nephrolithiasis, and acontractile bladder managed with [facility] who presents today for follow-up of an abnormal CT urogram with findings including a minimally obstructive 7 mm proximal right ureteral stone and left hydroureter with distal left ureteral thickening, most likely etiology infection but cannot rule out stricture versus tumor.  UA today is grossly infected consistent with possible left ureteritis. 1. Right ureteral stone Intermittent right flank pain associated with a 7 mm proximal right ureteral stone.  On CT, this appears to be minimally obstructive.  We discussed that he has an approximate 40% chance of passing the stone on his own, however given concern for infection as below I recommend pursuing definitive management of the stone.  See below for planning.  Prescribing Percocet and Zofran for symptom control in the interim.  He expressed understanding. - oxyCODONE-acetaminophen (PERCOCET/ROXICET) 5-325 MG tablet; Take 1-2 tablets by mouth every 6 (six) hours as needed for up to 5 days for severe pain.  Dispense: 16 tablet; Refill: 0 - ondansetron (ZOFRAN-ODT) 4 MG disintegrating tablet; Take 1 tablet (4 mg total) by mouth every 8 (eight) hours as needed for nausea or vomiting.  Dispense: 20 tablet; Refill: 0  2. Hydroureter on left Most likely due to ureteritis in the setting of grossly infected urine, however we discussed that urothelial carcinoma and ureteral stricture are also on the differential.  He is afebrile, VSS in clinic today and well-appearing.  He denies fevers at home.   No indication for urgent urologic intervention at this time, though I did recommend Foley catheter placement for maximum urinary decompression while on antibiotic therapy and he agreed, see procedure note above.  We administered 1 dose IM Rocephin in clinic today, will send for culture and have him transition to empiric Levaquin tomorrow for a total of 14 days of antibiotic therapy.  We discussed the need for further evaluation of his left ureteral findings after clearance of his infection.  I recommended proceeding to the OR with Dr. SBernardo Heaterlater this month for cystoscopy with right ureteroscopy/laser lithotripsy/stent placement and left diagnostic ureteroscopy with possible ureter versus bladder biopsies.  He is in agreement with this plan, OR orders placed today.  We had a lengthy conversation regarding return precautions including fever, chills, uncontrolled pain, and uncontrolled nausea/vomiting.  He understands to proceed to the emergency department immediately if he develops these. - Urinalysis, Complete - CULTURE, URINE  COMPREHENSIVE - cefTRIAXone (ROCEPHIN) injection 1 g - levofloxacin (LEVAQUIN) 500 MG tablet; Take 1 tablet (500 mg total) by mouth daily for 13 days.  Dispense: 13 tablet; Refill: 0   Return in 11 days (on 03/29/2022) for Surgery with Dr. Bernardo Heater as above.  Debroah Loop, PA-C  [facility] [address] [place], [place] [address] [phone]

## 2022-03-21 ENCOUNTER — Telehealth: Payer: Self-pay

## 2022-03-21 NOTE — Progress Notes (Signed)
  Phone: [phone] Fax: [phone]  REQUEST FOR SURGICAL CLEARANCE       Date: Date: 03/21/22  Faxed to: Dr. Saunders Revel  Surgeon: Dr. John Giovanni, MD     Date of Surgery: 03/29/2022  Operation: Right Ureteroscopy with laser lithotripsy and stent placement, Left Diagnostic Ureteroscopy with possible bladder biopsy versus Left Ureteral Biopsy   Anesthesia Type: General   Diagnosis: Right Ureteral Stone, Left Hydroureter  Patient Requires:   Cardiac / Vascular Clearance : Yes  Reason: Would like for patient to hold ASA '81mg'$    Risk Assessment:    Low   '[]'$       Moderate   '[]'$     High   '[]'$           This patient is optimized for surgery  YES '[]'$       NO   '[]'$    I recommend further assessment/workup prior to surgery. YES '[]'$      NO  '[]'$   Appointment scheduled for: _______________________   Further recommendations: ____________________________________     Physician Signature:__________________________________   Printed Name: ________________________________________   Date: _________________

## 2022-03-21 NOTE — Progress Notes (Signed)
   [facility] Surgical Posting From  Surgery Date: Date: 03/29/2022  Surgeon: Dr. John Giovanni, MD  Inpt ( No  )   Outpt (Yes)   Obs ( No  )   Diagnosis: N20.1 Right Ureteral Stone, N13.4 Left Hydroureter  -CPT: 02111, [id number], [id number], [id number]  Surgery: Right Ureteroscopy with laser lithotripsy and stent placement, Left Diagnostic Ureteroscopy with possible bladder biopsy versus Left Ureteral Biopsy  Stop Anticoagulations: Yes, needs to hold ASA  Cardiac/Medical/Pulmonary Clearance needed: Yes  Clearance needed from Dr: Saunders Revel  Clearance request sent on: Date: 03/21/22  *Orders entered into EPIC  Date: 03/21/22   *Case booked in EPIC  Date: 03/21/22  *Notified pt of Surgery: Date: 03/21/22  PRE-OP UA & CX: no  *Placed into Prior Authorization Work [place] Date: 03/21/22  Assistant/laser/rep:No

## 2022-03-21 NOTE — Telephone Encounter (Signed)
I spoke with Mr. Kevin Melton. We have discussed possible surgery dates and Tuesday January 23rd, 2024 was agreed upon by all parties. Patient given information about surgery date, what to expect pre-operatively and post operatively.  We discussed that a Pre-Admission Testing office will be calling to set up the pre-op visit that will take place prior to surgery, and that these appointments are typically done over the phone with a Pre-Admissions RN. Informed patient that our office will communicate any additional care to be provided after surgery. Patients questions or concerns were discussed during our call. Advised to call our office should there be any additional information, questions or concerns that arise. Patient verbalized understanding.

## 2022-03-22 ENCOUNTER — Telehealth: Payer: Self-pay | Admitting: *Deleted

## 2022-03-22 LAB — CULTURE, URINE COMPREHENSIVE

## 2022-03-22 NOTE — Telephone Encounter (Signed)
   Pre-operative Risk Assessment    Patient Name: Kevin Melton  DOB: [birth year] MRN: [id number]      Request for Surgical Clearance    Procedure:   RIGHT URETEROSCOPY WITH LASER LITHOTRIPSY AND STENT PLACEMENT, LEFT DIAGNOSTIC URETEROSCOPY WITH POSSIBLE BLADDER BIOPSY VERSUS LEFT URETERAL BIOPSY  Date of Surgery:  Clearance 03/29/22                                 Surgeon:  DR. Nicki Reaper STOIOFF Surgeon's Group or Practice Name:  [facility] Phone number:  [phone] Fax number:  [phone]   Type of Clearance Requested:   - Pharmacy:  Hold Aspirin NOT INDICATED HOW LONG   Type of Anesthesia:  General    Additional requests/questions:    Astrid Divine   03/22/2022, 2:47 PM

## 2022-03-22 NOTE — Telephone Encounter (Signed)
   Patient Name: Kevin Melton  DOB: [birth year] MRN: [id number]  Primary Cardiologist: Nelva Bush, MD  Chart reviewed as part of pre-operative protocol coverage.  Mr. Nannini was last seen in clinic on 10/27/2021 by Finis Bud, NP.  During visit patient was doing well with no new anginal or cardiac complaints.  Regarding ASA therapy, we recommend continuation of ASA throughout the perioperative period.  However, if the surgeon feels that cessation of ASA is required in the perioperative period, it may be stopped 5-7 days prior to surgery with a plan to resume it as soon as felt to be feasible from a surgical standpoint in the post-operative period.   Please call with any questions.   Mable Fill, Marissa Nestle, NP 03/22/2022, 3:28 PM

## 2022-03-24 ENCOUNTER — Encounter: Payer: Self-pay | Admitting: Urology

## 2022-03-24 ENCOUNTER — Other Ambulatory Visit: Payer: Self-pay

## 2022-03-24 ENCOUNTER — Encounter
Admission: RE | Admit: 2022-03-24 | Discharge: 2022-03-24 | Disposition: A | Discharge status: Home / Self Care | Payer: PPO | Source: Ambulatory Visit | Attending: Urology | Admitting: Urology

## 2022-03-24 NOTE — Progress Notes (Signed)
Perioperative Services  Pre-Admission/Anesthesia Testing Clinical Review  Date: 03/25/22  Patient Demographics:  Name: Lottie Siska Somma DOB:   [birth year] MRN:   [id number]  Planned Surgical Procedure(s):    Case: [id number] Date/Time: 03/29/22 0841   Procedures:      CYSTOSCOPY/URETEROSCOPY/HOLMIUM LASER/STENT PLACEMENT (Right)     DIAGNOSTIC URETEROSCOPY (Left)     CYSTOSCOPY WITH BLADDER BIOPSY     URETERAL BIOPSY (Left)   Anesthesia type: General   Pre-op diagnosis: Right Ureteral Stone, Left Hydroureter   Location: [facility] OR ROOM 10 / [facility]   Surgeons: Abbie Sons, MD   NOTE: Available PAT nursing documentation and vital signs have been reviewed. Clinical nursing staff has updated patient's PMH/PSHx, current medication list, and drug allergies/intolerances to ensure comprehensive history available to assist in medical decision making as it pertains to the aforementioned surgical procedure and anticipated anesthetic course. Extensive review of available clinical information performed. [place] PMH and PSHx updated with any diagnoses/procedures that  may have been inadvertently omitted during his intake with the pre-admission testing department's nursing staff.  Clinical Discussion:  Esther Broyles Cayton is a 72 y.o. male who is submitted for pre-surgical anesthesia review and clearance prior to him undergoing the above procedure. Patient has never been a smoker. Pertinent PMH includes: CAD, inferior MI, diastolic dysfunction, RBBB, bradycardia, aortic atherosclerosis, HTN, HLD, prediabetes, CKD-III, GERD (no daily Tx), contractile bladder secondary to BPH/detrusor hypotonicity (requires CIC), nephrolithiasis.  Patient is followed by cardiology (End, MD). He was last seen in the cardiology clinic on 11/03/2021; notes reviewed.  At the time of his clinic visit, patient doing well overall from a cardiovascular perspective.  He denied any episodes of chest  pain, shortness breath, PND, orthopnea, palpitations, significant peripheral edema, vertiginous symptoms, or presyncope/syncope.  Patient reporting a longstanding history of asymptomatic bradycardia.  Patient with a past medical history significant for cardiovascular diagnoses.  Of note, complete records regarding patient's cardiovascular history unavailable for review at time of consult.  Patient has received care in [place], [place].  Information for consult gathered from patient report and from notes supplied by his local cardiologist.  Patient reported to have suffered an inferior wall MI in 04/2011.  Again, this was treated while living in [place].  BMS (unknown type) x 1 was placed to the RCA.  TTE performed on 11/19/2020 revealed a normal left trickle systolic function with an EF of 60-65%.  There were no regional wall motion abnormalities.  There was mild LVH. Left ventricular diastolic Doppler parameters consistent with abnormal relaxation (G1DD).  Average GLS was normal at -20.7%.  PASP normal at 20.5 mmHg.  There was mild mitral and aortic valve regurgitation.  All transvalvular gradients were noted to be normal with no evidence suggestive of valvular stenosis.  Myocardial perfusion imaging study performed on 12/01/2020 revealed a mildly reduced left ventricular systolic function with an EF of 40-45%.  Coronary artery calcification versus old stent noted and the RCA.  Attenuation correction CT imaging revealed aortic atherosclerosis.  Hypodensity in the liver also noted, which was felt to likely represent a hepatic cyst.  Small in size, moderate in severity, fixed basal inferior defect potentially representing scar versus artifact.  There was no evidence of significant ischemia observed.  While study was abnormal, cardiology noted that findings were probably low risk.  Blood pressure reasonably controlled at 132/68 mmHg on currently prescribed CCB (amlodipine) and ACEi (benazepril) therapies.   Patient is on atorvastatin for his HLD diagnosis and  further ASCVD prevention.  Patient has a supply of short acting nitrates (NTG) to use on a as needed basis for recurrent angina/anginal equivalent symptoms; denied recent use.  Patient does have a prediabetes diagnosis that he is managing with diet lifestyle modification.  Last hemoglobin A1c was 6.4% when checked on 10/19/2021. Patient does not have an OSAH diagnosis.  Patient maintains a "very active" lifestyle.  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 Functional capacity, as defined by DASI, is documented as being >/= 4 METS.  No changes were made to his medication regimen.  Patient to follow-up with outpatient cardiology in 1 year or sooner if needed.  Leslie Langille Thaker is scheduled for an CYSTOSCOPY/URETEROSCOPY/HOLMIUM LASER/STENT PLACEMENT (Right); DIAGNOSTIC URETEROSCOPY (Left) CYSTOSCOPY WITH BLADDER BIOPSY; URETERAL BIOPSY (Left) on 03/29/2022 with Dr. John Giovanni, MD.  Given patient's past medical history significant for cardiovascular diagnoses, presurgical cardiac clearance was sought by the PAT team. Per cardiology, "based ACC/AHA guidelines, the patient's past medical history, and the amount of time since his last clinic visit, this patient would be at an overall ACCEPTABLE risk for the planned procedure without further cardiovascular testing or intervention at this time". In review of his medication reconciliation, it is noted that patient is currently on prescribed daily antiplatelet therapy. He has been instructed on recommendations for holding his daily low-dose ASA for 5 days prior to his procedure with plans to restart as soon as postoperative bleeding risk felt to be minimized by his attending surgeon. The patient has been instructed that his last dose of his ASA should be on 03/23/2022.  Patient denies previous perioperative complications with anesthesia in the past. In review of  the available records, it is noted that patient underwent a general anesthetic course here at [facility] (ASA III) in 12/2021 without documented complications.      03/18/2022    1:02 PM 03/11/2022   10:16 AM 02/28/2022    9:34 AM  Vitals with BMI  Height  '5\' 9"'$    Weight 208 lbs    BMI 16.1    Systolic 096 045 409  Diastolic 62 70 73  Pulse 58 58 56    Providers/Specialists:   NOTE: Primary physician provider listed below. Patient may have been seen by APP or partner within same practice.   PROVIDER ROLE / SPECIALTY LAST Claud Kelp, MD Urology (Surgeon) 03/18/2022  Virginia Crews, MD Primary Care Provider 05/03/2021  Nelva Bush, MD Cardiology 11/03/2021   Allergies:  Patient has no known allergies.  Current Home Medications:   No current facility-administered medications for this encounter.    allopurinol (ZYLOPRIM) 300 MG tablet   amLODipine (NORVASC) 10 MG tablet   aspirin 81 MG chewable tablet   atorvastatin (LIPITOR) 40 MG tablet   benazepril (LOTENSIN) 40 MG tablet   finasteride (PROSCAR) 5 MG tablet   levofloxacin (LEVAQUIN) 500 MG tablet   nitroGLYCERIN (NITROSTAT) 0.4 MG SL tablet   ondansetron (ZOFRAN-ODT) 4 MG disintegrating tablet   oxybutynin (DITROPAN) 5 MG tablet   History:   Past Medical History:  Diagnosis Date   Acontractile bladder 06/17/2021   a.) secondary to BPH/detrusor hypotonicity; b.) requires self CIC 4x/day   Aortic atherosclerosis (HCC)    BPH (benign prostatic hyperplasia)    Bradycardia    Chronic kidney disease, stage III (moderate) (HCC)    Coronary artery disease    a. 2013 Inf MI/PCI: BMS to RCA; b.  11/2013 MV: small reversible apical anteroseptal defect/ischemia. EF 60%; c. 11/2020 MV: small, fixed basal inf defect - scar vs artifact. No ischemia. EF 45-50% (60-65% by echo).   Diastolic dysfunction    a.) TTE 11/19/2020: EF 60-65%, mild LVH, mild MR/AR, G1DD   Dyshidrotic  eczema    GERD (gastroesophageal reflux disease)    Gout    Hepatic cyst    History of umbilical hernia repair    Hyperlipidemia    Hypertension    Inferior MI ([place]) 04/2011   a.) MI 04/2011 --> treated in [place] --> BMS (unknown type) to RCA   Kidney stones    Prediabetes    RBBB    Sigmoid diverticulosis    Skin cancer    Past Surgical History:  Procedure Laterality Date   COLONOSCOPY  2013   CORONARY ANGIOPLASTY WITH STENT PLACEMENT  2013   [place]   ESOPHAGOGASTRODUODENOSCOPY N/A 12/08/2021   Procedure: ESOPHAGOGASTRODUODENOSCOPY (EGD);  Surgeon: Toledo, Benay Pike, MD;  Location: [facility] ENDOSCOPY;  Service: Gastroenterology;  Laterality: N/A;   ESOPHAGOGASTRODUODENOSCOPY (EGD) WITH PROPOFOL N/A 01/05/2017   Procedure: ESOPHAGOGASTRODUODENOSCOPY (EGD) WITH PROPOFOL With Dilation;  Surgeon: Lin Landsman, MD;  Location: [facility] ENDOSCOPY;  Service: Gastroenterology;  Laterality: N/A;   ESOPHAGOGASTRODUODENOSCOPY (EGD) WITH PROPOFOL N/A 07/03/2019   Procedure: ESOPHAGOGASTRODUODENOSCOPY (EGD) WITH PROPOFOL;  Surgeon: Lin Landsman, MD;  Location: [facility] ENDOSCOPY;  Service: Gastroenterology;  Laterality: N/A;   ESOPHAGOGASTRODUODENOSCOPY (EGD) WITH PROPOFOL N/A 11/04/2019   Procedure: ESOPHAGOGASTRODUODENOSCOPY (EGD) WITH PROPOFOL;  Surgeon: Lin Landsman, MD;  Location: [facility] ENDOSCOPY;  Service: Gastroenterology;  Laterality: N/A;   HERNIA REPAIR     LITHOTRIPSY  [id number]   UMBILICAL HERNIA REPAIR N/A 02/20/2017   Primary repair 2 cm umbilical hernia;  Surgeon: Robert Bellow, MD;  Location: [facility] ORS;  Service: General;  Laterality: N/A;   Family History  Problem Relation Age of Onset   Dementia Mother 13   Lung disease Father 11       collapsed lung   Healthy Sister    Healthy Brother    Heart Problems Brother    Healthy Son    Healthy Sister    Colon cancer Neg Hx    Prostate cancer Neg Hx    Heart disease Neg Hx    Social History   Tobacco Use    Smoking status: Never   Smokeless tobacco: Never  Vaping Use   Vaping Use: Never used  Substance Use Topics   Alcohol use: No   Drug use: No    Pertinent Clinical Results:  LABS: Labs reviewed: Acceptable for surgery.  Lab Results  Component Value Date   WBC 8.5 02/28/2022   HGB 14.7 02/28/2022   HCT 45.3 02/28/2022   MCV 93.0 02/28/2022   PLT 229 02/28/2022   Lab Results  Component Value Date   NA 141 02/28/2022   K 4.2 02/28/2022   CO2 25 02/28/2022   GLUCOSE 133 (H) 02/28/2022   BUN 21 02/28/2022   CREATININE 1.31 (H) 02/28/2022   CALCIUM 8.9 02/28/2022   EGFR 57 (L) 10/18/2021   GFRNONAA 59 (L) 02/28/2022    No visits with results within 3 Day(s) from this visit.  Latest known visit with results is:  Office Visit on 03/18/2022  Component Date Value Ref Range Status   Specific Gravity, UA 03/18/2022 <1.005 (L)  1.005 - 1.030 Final   pH, UA 03/18/2022 5.5  5.0 - 7.5 Final   Color, UA 03/18/2022 Yellow  Yellow  Final   Appearance Ur 03/18/2022 Cloudy (A)  Clear Final   Leukocytes,UA 03/18/2022 3+ (A)  Negative Final   Protein,UA 03/18/2022 Negative  Negative/Trace Final   Glucose, UA 03/18/2022 Negative  Negative Final   Ketones, UA 03/18/2022 Negative  Negative Final   RBC, UA 03/18/2022 1+ (A)  Negative Final   Bilirubin, UA 03/18/2022 Negative  Negative Final   Urobilinogen, Ur 03/18/2022 0.2  0.2 - 1.0 mg/dL Final   Nitrite, UA 03/18/2022 Negative  Negative Final   Microscopic Examination 03/18/2022 See below:   Final   Urine Culture, Comprehensive 03/18/2022 Final report (A)   Final   Organism ID, Bacteria 03/18/2022 Klebsiella pneumoniae (A)   Final   Comment: Cefazolin <=4 ug/mL Cefazolin with an MIC <=16 predicts susceptibility to the oral agents cefaclor, cefdinir, cefpodoxime, cefprozil, cefuroxime, cephalexin, and loracarbef when used for therapy of uncomplicated urinary tract infections due to E. coli, Klebsiella pneumoniae, and  Proteus mirabilis. 50,000-100,000 colony forming units per mL    Organism ID, Bacteria [id number] Not applicable   Final   ANTIMICROBIAL SUSCEPTIBILITY 03/18/2022 Comment   Final   Comment:       ** S = Susceptible; I = Intermediate; R = Resistant **                    P = Positive; N = Negative             MICS are expressed in micrograms per mL    Antibiotic                 RSLT#1    RSLT#2    RSLT#3    RSLT#4 Amoxicillin/Clavulanic Acid    S Ampicillin                     R Cefepime                       S Ceftriaxone                    S Cefuroxime                     S Ciprofloxacin                  S Ertapenem                      S Gentamicin                     S Imipenem                       S Levofloxacin                   S Meropenem                      S Nitrofurantoin                 S Piperacillin/Tazobactam        S Tetracycline                   S Tobramycin                     S Trimethoprim/Sulfa             S  WBC, UA 03/18/2022 >30 (A)  0 - 5 /hpf Final   RBC, Urine 03/18/2022 3-10 (A)  0 - 2 /hpf Final   Epithelial Cells (non renal) 03/18/2022 0-10  0 - 10 /hpf Final   Bacteria, UA 03/18/2022 Many (A)  None seen/Few Final    ECG: Date: 11/03/2021 Time ECG obtained: 0831 AM Rate: 48 bpm Rhythm:  Sinus bradycardia; RBBB Axis (leads I and aVF): Normal Intervals: PR 156 ms. QRS 142 ms. QTc 425 ms. ST segment and T wave changes: No evidence of acute ST segment elevation or depression Comparison: Similar to previous tracing obtained on 10/14/2020   IMAGING / PROCEDURES: CT HEMATURIA WORKUP performed on 03/18/2022 Borderline right hydronephrosis associated with a 0.7 cm proximal right ureteral calculus. Substantial left renal scarring, caliceal blunting, substantial left hydroureter, and diffuse left urothelial enhancement without stone identified. There is also diffuse thickening and enhancement of the distal 1.6 cm of the left ureter extending to the  left UVJ. There is also diffuse wall thickening in the urinary bladder compatible with cystitis, and small caliber of the distal left ureter at the UVJ with associated wall enhancement. The appearance raises concern for possible infection/ureteritis and possibly chronic left UVJ obstruction due to stricture. UVJ tumor not totally excluded although inflammatory etiology is favored. Abnormal filling defects in the left collecting system without associated enhancement, raising the possibility of blood clots, fungus balls, or sloughed papillae from papillary necrosis. Mild prostatomegaly. Cannot exclude prostatitis. Mild distal esophageal wall thickening is nonspecific but esophagitis would the most common potential cause. 6. Mild left foraminal impingement at L4-5 due to facet spurring. Mild cardiomegaly.  Coronary atherosclerosis. Sigmoid colon diverticulosis. Aortic atherosclerosis  US RENAL performed on 03/15/2022 Mild to moderate left-sided hydronephrosis that persists on postvoid imaging Left hydroureter. The right kidney is unremarkable. The bladder is normal other than a 7.7 cc postvoid residual.  MYOCARDIAL PERFUSION IMAGING STUDY (LEXISCAN) performed on 12/01/2020 Mildly reduced left ventricular systolic function with an EF of 45-50% Coronary calcification versus old stent noted in the RCA.  Aortic atherosclerosis is noted on the attenuation correction CT. Hypodensity in the liver most likely represents a cyst, but is incompletely characterized on this study.  Consider enhanced CT, MRI, or abdominal ultrasound for further characterization as clinically indicated. No significant myocardial ischemia. Small in size, moderate in severity, fixed basal inferior defect that may represent scar versus artifact Abnormal, probably low risk pharmacologic myocardial perfusion stress test  TRANSTHORACIC ECHOCARDIOGRAM performed on 11/19/2020 Left ventricular ejection fraction, by estimation, is 60 to  65%. The left ventricle has normal function. The left ventricle has no regional wall motion abnormalities. There is mild left ventricular hypertrophy. Left ventricular diastolic parameters are consistent with Grade I diastolic dysfunction (impaired relaxation). The average left ventricular global longitudinal strain is -20.7 %. The global longitudinal strain is normal.  Right ventricular systolic function is normal. The right ventricular size is normal. There is normal pulmonary artery systolic pressure. The estimated right ventricular systolic pressure is 87.5 mmHg.  The mitral valve is normal in structure. Mild mitral valve regurgitation.  The aortic valve is normal in structure. Aortic valve regurgitation is mild.  The inferior vena cava is normal in size with greater than 50% respiratory variability, suggesting right atrial pressure of 3 mmHg.   Impression and Plan:  Jalien Weakland Guidroz has been referred for pre-anesthesia review and clearance prior to him undergoing the planned anesthetic and procedural courses. Available labs, pertinent testing, and imaging results were personally  reviewed by me. This patient has been appropriately cleared by cardiology with an overall ACCEPTABLE risk of significant perioperative cardiovascular complications.  Based on clinical review performed today (03/25/22), barring any significant acute changes in the patient's overall condition, it is anticipated that he will be able to proceed with the planned surgical intervention. Any acute changes in clinical condition may necessitate his procedure being postponed and/or cancelled. Patient will meet with anesthesia team (MD and/or CRNA) on the day of his procedure for preoperative evaluation/assessment. Questions regarding anesthetic course will be fielded at that time.   Pre-surgical instructions were reviewed with the patient during his PAT appointment and questions were fielded by PAT clinical staff. Patient was  advised that if any questions or concerns arise prior to his procedure then he should return a call to PAT and/or his surgeon's office to discuss.  Honor Loh, MSN, APRN, FNP-C, CEN [facility]  Peri-operative Services Nurse Practitioner Phone: [phone] Fax: [phone] 03/25/22 9:35 AM  NOTE: This note has been prepared using Dragon dictation software. Despite my best ability to proofread, there is always the potential that unintentional transcriptional errors may still occur from this process.

## 2022-03-24 NOTE — Patient Instructions (Addendum)
Your procedure is scheduled on: 03/29/22 - Tuesday Report to the Registration Desk on the 1st floor of the [facility]. To find out your arrival time, please call [phone] between 1PM - 3PM on: 03/28/22 - Monday If your arrival time is 6:00 am, do not arrive prior to that time as the [facility] entrance doors do not open until 6:00 am.  REMEMBER: Instructions that are not followed completely may result in serious medical risk, up to and including death; or upon the discretion of your surgeon and anesthesiologist your surgery may need to be rescheduled.  Do not eat food after midnight the night before surgery.  No gum chewing, lozengers or hard candies.  TAKE THESE MEDICATIONS THE MORNING OF SURGERY WITH A SIP OF WATER:  - amLODipine (NORVASC) - finasteride (PROSCAR)  - levofloxacin (LEVAQUIN)  - atorvastatin (LIPITOR)    One week prior to surgery: Stop Anti-inflammatories (NSAIDS) such as Advil, Aleve, Ibuprofen, Motrin, Naproxen, Naprosyn and Aspirin based products such as Excedrin, Goodys Powder, BC Powder.  Stop ANY OVER THE COUNTER supplements until after surgery.  You may however, continue to take Tylenol if needed for pain up until the day of surgery.  No Alcohol for 24 hours before or after surgery.  No Smoking including e-cigarettes for 24 hours prior to surgery.  No chewable tobacco products for at least 6 hours prior to surgery.  No nicotine patches on the day of surgery.  Do not use any "recreational" drugs for at least a week prior to your surgery.  Please be advised that the combination of cocaine and anesthesia may have negative outcomes, up to and including death. If you test positive for cocaine, your surgery will be cancelled.  On the morning of surgery brush your teeth with toothpaste and water, you may rinse your mouth with mouthwash if you wish. Do not swallow any toothpaste or mouthwash.  Do not wear jewelry, make-up, hairpins, clips or nail  polish.  Do not wear lotions, powders, or perfumes.   Do not shave body from the neck down 48 hours prior to surgery just in case you cut yourself which could leave a site for infection.  Also, freshly shaved skin may become irritated if using the CHG soap.  Contact lenses, hearing aids and dentures may not be worn into surgery.  Do not bring valuables to the hospital. [facility] is not responsible for any missing/lost belongings or valuables.   Notify your doctor if there is any change in your medical condition (cold, fever, infection).  Wear comfortable clothing (specific to your surgery type) to the hospital.  After surgery, you can help prevent lung complications by doing breathing exercises.  Take deep breaths and cough every 1-2 hours. Your doctor may order a device called an Incentive Spirometer to help you take deep breaths. When coughing or sneezing, hold a pillow firmly against your incision with both hands. This is called "splinting." Doing this helps protect your incision. It also decreases belly discomfort.  If you are being admitted to the hospital overnight, leave your suitcase in the car. After surgery it may be brought to your room.  If you are being discharged the day of surgery, you will not be allowed to drive home. You will need a responsible adult (18 years or older) to drive you home and stay with you that night.   If you are taking public transportation, you will need to have a responsible adult (18 years or older) with you. Please  confirm with your physician that it is acceptable to use public transportation.   Please call the [place] Dept. at [phone] if you have any questions about these instructions.  Surgery Visitation Policy:  Patients undergoing a surgery or procedure may have two family members or support persons with them as long as the person is not COVID-19 positive or experiencing its symptoms.   Inpatient Visitation:     Visiting hours are 7 a.m. to 8 p.m. Up to four visitors are allowed at one time in a patient room. The visitors may rotate out with other people during the day. One designated support person (adult) may remain overnight.  Due to an increase in RSV and influenza rates and associated hospitalizations, children ages 31 and under will not be able to visit patients in [facility]. Masks continue to be strongly recommended.

## 2022-03-25 ENCOUNTER — Encounter: Payer: Self-pay | Admitting: Urology

## 2022-03-29 ENCOUNTER — Encounter
Admission: RE | Disposition: A | Discharge status: Home / Self Care | Payer: Self-pay | Source: Home / Self Care | Attending: Urology

## 2022-03-29 ENCOUNTER — Ambulatory Visit: Payer: PPO | Admitting: Urgent Care

## 2022-03-29 ENCOUNTER — Other Ambulatory Visit: Payer: Self-pay

## 2022-03-29 ENCOUNTER — Ambulatory Visit: Payer: PPO

## 2022-03-29 ENCOUNTER — Encounter: Payer: Self-pay | Admitting: Urology

## 2022-03-29 ENCOUNTER — Ambulatory Visit
Admission: RE | Admit: 2022-03-29 | Discharge: 2022-03-29 | Disposition: A | Discharge status: Home / Self Care | Payer: PPO | Attending: Urology | Admitting: Urology

## 2022-03-29 DIAGNOSIS — N134 Hydroureter: Secondary | ICD-10-CM

## 2022-03-29 DIAGNOSIS — N400 Enlarged prostate without lower urinary tract symptoms: Secondary | ICD-10-CM | POA: Diagnosis not present

## 2022-03-29 DIAGNOSIS — N183 Chronic kidney disease, stage 3 unspecified: Secondary | ICD-10-CM | POA: Insufficient documentation

## 2022-03-29 DIAGNOSIS — I252 Old myocardial infarction: Secondary | ICD-10-CM | POA: Diagnosis not present

## 2022-03-29 DIAGNOSIS — I2510 Atherosclerotic heart disease of native coronary artery without angina pectoris: Secondary | ICD-10-CM | POA: Diagnosis not present

## 2022-03-29 DIAGNOSIS — K219 Gastro-esophageal reflux disease without esophagitis: Secondary | ICD-10-CM | POA: Diagnosis not present

## 2022-03-29 DIAGNOSIS — N132 Hydronephrosis with renal and ureteral calculous obstruction: Secondary | ICD-10-CM | POA: Insufficient documentation

## 2022-03-29 DIAGNOSIS — N201 Calculus of ureter: Secondary | ICD-10-CM

## 2022-03-29 DIAGNOSIS — I129 Hypertensive chronic kidney disease with stage 1 through stage 4 chronic kidney disease, or unspecified chronic kidney disease: Secondary | ICD-10-CM | POA: Insufficient documentation

## 2022-03-29 DIAGNOSIS — E785 Hyperlipidemia, unspecified: Secondary | ICD-10-CM | POA: Insufficient documentation

## 2022-03-29 DIAGNOSIS — N200 Calculus of kidney: Secondary | ICD-10-CM | POA: Diagnosis not present

## 2022-03-29 DIAGNOSIS — R310 Gross hematuria: Secondary | ICD-10-CM | POA: Diagnosis not present

## 2022-03-29 DIAGNOSIS — N1330 Unspecified hydronephrosis: Secondary | ICD-10-CM | POA: Diagnosis not present

## 2022-03-29 HISTORY — DX: Personal history of other diseases of the digestive system: Z87.19

## 2022-03-29 HISTORY — DX: Bradycardia, unspecified: R00.1

## 2022-03-29 HISTORY — DX: Other specified diseases of liver: K76.89

## 2022-03-29 HISTORY — PX: CYSTOSCOPY/URETEROSCOPY/HOLMIUM LASER/STENT PLACEMENT: SHX6546

## 2022-03-29 HISTORY — DX: Benign prostatic hyperplasia without lower urinary tract symptoms: N40.0

## 2022-03-29 HISTORY — DX: Diverticulosis of large intestine without perforation or abscess without bleeding: K57.30

## 2022-03-29 HISTORY — PX: URETEROSCOPY: SHX842

## 2022-03-29 HISTORY — DX: Prediabetes: R73.03

## 2022-03-29 HISTORY — DX: Dyshidrosis (pompholyx): L30.1

## 2022-03-29 HISTORY — DX: Unspecified malignant neoplasm of skin, unspecified: C44.90

## 2022-03-29 HISTORY — DX: Gout, unspecified: M10.9

## 2022-03-29 HISTORY — DX: Atherosclerosis of aorta: I70.0

## 2022-03-29 SURGERY — CYSTOSCOPY/URETEROSCOPY/HOLMIUM LASER/STENT PLACEMENT
Anesthesia: General | Site: Ureter | Laterality: Right

## 2022-03-29 MED ORDER — CEFAZOLIN SODIUM-DEXTROSE 2-3 GM-%(50ML) IV SOLR
INTRAVENOUS | Status: DC | PRN
  Administered 2022-03-29: 2 g via INTRAVENOUS

## 2022-03-29 MED ORDER — CEFAZOLIN SODIUM-DEXTROSE 2-4 GM/100ML-% IV SOLN
INTRAVENOUS | Status: AC
  Filled 2022-03-29: qty 100

## 2022-03-29 MED ORDER — CEFAZOLIN SODIUM-DEXTROSE 2-4 GM/100ML-% IV SOLN: 2.0000 g | INTRAVENOUS | Status: DC

## 2022-03-29 MED ORDER — ROCURONIUM BROMIDE 100 MG/10ML IV SOLN
INTRAVENOUS | Status: DC | PRN
  Administered 2022-03-29: 50 mg via INTRAVENOUS

## 2022-03-29 MED ORDER — OXYCODONE HCL 5 MG/5ML PO SOLN: 5.0000 mg | Freq: Once | ORAL | Status: DC | PRN

## 2022-03-29 MED ORDER — ONDANSETRON HCL 4 MG/2ML IJ SOLN
INTRAMUSCULAR | Status: DC | PRN
  Administered 2022-03-29: 4 mg via INTRAVENOUS

## 2022-03-29 MED ORDER — FENTANYL CITRATE (PF) 100 MCG/2ML IJ SOLN: 25.0000 ug | INTRAMUSCULAR | Status: DC | PRN

## 2022-03-29 MED ORDER — HYDROCODONE-ACETAMINOPHEN 5-325 MG PO TABS: 1.0000 | ORAL_TABLET | Freq: Four times a day (QID) | ORAL | 0 refills | Status: DC | PRN

## 2022-03-29 MED ORDER — SODIUM CHLORIDE 0.9 % IR SOLN
Status: DC | PRN
  Administered 2022-03-29: 3000 mL via INTRAVESICAL

## 2022-03-29 MED ORDER — FENTANYL CITRATE (PF) 100 MCG/2ML IJ SOLN
INTRAMUSCULAR | Status: AC
  Filled 2022-03-29: qty 2

## 2022-03-29 MED ORDER — PROMETHAZINE HCL 25 MG/ML IJ SOLN: 6.2500 mg | INTRAMUSCULAR | Status: DC | PRN

## 2022-03-29 MED ORDER — OXYCODONE HCL 5 MG PO TABS: 5.0000 mg | ORAL_TABLET | Freq: Once | ORAL | Status: DC | PRN

## 2022-03-29 MED ORDER — FAMOTIDINE 20 MG PO TABS
ORAL_TABLET | ORAL | Status: AC
  Administered 2022-03-29: 20 mg via ORAL
  Filled 2022-03-29: qty 1

## 2022-03-29 MED ORDER — FAMOTIDINE 20 MG PO TABS: 20.0000 mg | ORAL_TABLET | Freq: Once | ORAL | Status: AC

## 2022-03-29 MED ORDER — EPHEDRINE SULFATE (PRESSORS) 50 MG/ML IJ SOLN
INTRAMUSCULAR | Status: DC | PRN
  Administered 2022-03-29: 5 mg via INTRAVENOUS
  Administered 2022-03-29: 2.5 mg via INTRAVENOUS
  Administered 2022-03-29: 5 mg via INTRAVENOUS

## 2022-03-29 MED ORDER — ACETAMINOPHEN 10 MG/ML IV SOLN: 1000.0000 mg | Freq: Once | INTRAVENOUS | Status: DC | PRN

## 2022-03-29 MED ORDER — DROPERIDOL 2.5 MG/ML IJ SOLN: 0.6250 mg | Freq: Once | INTRAMUSCULAR | Status: DC | PRN

## 2022-03-29 MED ORDER — LACTATED RINGERS IV SOLN: INTRAVENOUS | Status: DC | PRN

## 2022-03-29 MED ORDER — ORAL CARE MOUTH RINSE: 15.0000 mL | Freq: Once | OROMUCOSAL | Status: AC

## 2022-03-29 MED ORDER — FENTANYL CITRATE (PF) 100 MCG/2ML IJ SOLN
INTRAMUSCULAR | Status: DC | PRN
  Administered 2022-03-29: 50 ug via INTRAVENOUS
  Administered 2022-03-29 (×2): 25 ug via INTRAVENOUS

## 2022-03-29 MED ORDER — IOHEXOL 180 MG/ML  SOLN
INTRAMUSCULAR | Status: DC | PRN
  Administered 2022-03-29: 50 mL

## 2022-03-29 MED ORDER — SUGAMMADEX SODIUM 200 MG/2ML IV SOLN
INTRAVENOUS | Status: DC | PRN
  Administered 2022-03-29: 200 mg via INTRAVENOUS

## 2022-03-29 MED ORDER — LACTATED RINGERS IV SOLN: INTRAVENOUS | Status: DC

## 2022-03-29 MED ORDER — DEXAMETHASONE SODIUM PHOSPHATE 10 MG/ML IJ SOLN
INTRAMUSCULAR | Status: DC | PRN
  Administered 2022-03-29: 5 mg via INTRAVENOUS

## 2022-03-29 MED ORDER — LIDOCAINE HCL (CARDIAC) PF 100 MG/5ML IV SOSY
PREFILLED_SYRINGE | INTRAVENOUS | Status: DC | PRN
  Administered 2022-03-29: 80 mg via INTRAVENOUS

## 2022-03-29 MED ORDER — PHENYLEPHRINE 80 MCG/ML (10ML) SYRINGE FOR IV PUSH (FOR BLOOD PRESSURE SUPPORT)
PREFILLED_SYRINGE | INTRAVENOUS | Status: DC | PRN
  Administered 2022-03-29 (×2): 80 ug via INTRAVENOUS
  Administered 2022-03-29: 160 ug via INTRAVENOUS
  Administered 2022-03-29: 80 ug via INTRAVENOUS
  Administered 2022-03-29: 160 ug via INTRAVENOUS
  Administered 2022-03-29: 80 ug via INTRAVENOUS

## 2022-03-29 MED ORDER — CHLORHEXIDINE GLUCONATE 0.12 % MT SOLN
OROMUCOSAL | Status: AC
  Administered 2022-03-29: 15 mL via OROMUCOSAL
  Filled 2022-03-29: qty 15

## 2022-03-29 MED ORDER — CHLORHEXIDINE GLUCONATE 0.12 % MT SOLN: 15.0000 mL | Freq: Once | OROMUCOSAL | Status: AC

## 2022-03-29 MED ORDER — PROPOFOL 10 MG/ML IV BOLUS
INTRAVENOUS | Status: DC | PRN
  Administered 2022-03-29: 150 mg via INTRAVENOUS
  Administered 2022-03-29: 30 mg via INTRAVENOUS

## 2022-03-29 SURGICAL SUPPLY — 41 items
BAG DRAIN SIEMENS DORNER NS (MISCELLANEOUS) ×4 IMPLANT
BAG DRN NS LF (MISCELLANEOUS) ×4
BAG PRESSURE INF REUSE 3000 (BAG) ×2 IMPLANT
BASKET LASER NITINOL 1.9FR (BASKET) ×2 IMPLANT
BASKET ZERO TIP 1.9FR (BASKET) IMPLANT
BRUSH SCRUB EZ 1% IODOPHOR (MISCELLANEOUS) ×4 IMPLANT
BSKT STON RTRVL 120 1.9FR (BASKET) ×4
BSKT STON RTRVL ZERO TP 1.9FR (BASKET)
CATH URET FLEX-TIP 2 LUMEN 10F (CATHETERS) ×4 IMPLANT
CATH URETL OPEN 5X70 (CATHETERS) ×2 IMPLANT
CATH URETL OPEN END 6X70 (CATHETERS) ×2 IMPLANT
CONRAY 43 FOR UROLOGY 50M (MISCELLANEOUS) ×4 IMPLANT
DRAPE UTILITY 15X26 TOWEL STRL (DRAPES) ×4 IMPLANT
DRSG TELFA 3X4 N-ADH STERILE (GAUZE/BANDAGES/DRESSINGS) ×4 IMPLANT
ELECT REM PT RETURN 9FT ADLT (ELECTROSURGICAL)
ELECTRODE REM PT RTRN 9FT ADLT (ELECTROSURGICAL) ×2 IMPLANT
FIBER LASER MOSES 200 DFL (Laser) IMPLANT
FORCEPS BIOP PIRANHA Y (CUTTING FORCEPS) IMPLANT
GAUZE 4X4 16PLY ~~LOC~~+RFID DBL (SPONGE) ×8 IMPLANT
GLOVE SURG UNDER POLY LF SZ7.5 (GLOVE) ×4 IMPLANT
GOWN STRL REUS W/ TWL LRG LVL3 (GOWN DISPOSABLE) ×8 IMPLANT
GOWN STRL REUS W/ TWL XL LVL3 (GOWN DISPOSABLE) ×4 IMPLANT
GOWN STRL REUS W/TWL LRG LVL3 (GOWN DISPOSABLE) ×8
GOWN STRL REUS W/TWL XL LVL3 (GOWN DISPOSABLE) ×4
GUIDEWIRE GREEN .038 145CM (MISCELLANEOUS) ×4 IMPLANT
GUIDEWIRE STR DUAL SENSOR (WIRE) ×6 IMPLANT
IV NS IRRIG 3000ML ARTHROMATIC (IV SOLUTION) ×4 IMPLANT
KIT TURNOVER CYSTO (KITS) ×4 IMPLANT
NDL SAFETY ECLIP 18X1.5 (MISCELLANEOUS) ×4 IMPLANT
PACK CYSTO AR (MISCELLANEOUS) ×4 IMPLANT
SET CYSTO W/LG BORE CLAMP LF (SET/KITS/TRAYS/PACK) ×4 IMPLANT
SHEATH NAVIGATOR HD 12/14X36 (SHEATH) ×2 IMPLANT
STENT CONTOUR 8FR X 24 (STENTS) ×2 IMPLANT
STENT URET 6FRX24 CONTOUR (STENTS) IMPLANT
STENT URET 6FRX26 CONTOUR (STENTS) IMPLANT
SURGILUBE 2OZ TUBE FLIPTOP (MISCELLANEOUS) ×4 IMPLANT
TRAP FLUID SMOKE EVACUATOR (MISCELLANEOUS) ×2 IMPLANT
VALVE UROSEAL ADJ ENDO (VALVE) ×2 IMPLANT
WATER STERILE IRR 1000ML POUR (IV SOLUTION) ×2 IMPLANT
WATER STERILE IRR 3000ML UROMA (IV SOLUTION) ×2 IMPLANT
WATER STERILE IRR 500ML POUR (IV SOLUTION) ×4 IMPLANT

## 2022-03-29 NOTE — Anesthesia Preprocedure Evaluation (Addendum)
Anesthesia Evaluation  Patient identified by MRN, date of birth, ID band Patient awake    Reviewed: Allergy & Precautions, H&P , NPO status , Patient's Chart, lab work & pertinent test results  History of Anesthesia Complications Negative for: history of anesthetic complications  Airway Mallampati: III  TM Distance: <3 FB Neck ROM: limited    Dental  (+) Edentulous Upper, Edentulous Lower   Pulmonary neg shortness of breath   Pulmonary exam normal        Cardiovascular Exercise Tolerance: Good hypertension, (-) angina + CAD, + Past MI and + Cardiac Stents  (-) DOE Normal cardiovascular exam+ dysrhythmias (RBBB)      Neuro/Psych negative neurological ROS  negative psych ROS   GI/Hepatic negative GI ROS, Neg liver ROS,,,  Endo/Other  negative endocrine ROS    Renal/GU CRFRenal disease  negative genitourinary   Musculoskeletal   Abdominal  (+) + obese  Peds  Hematology negative hematology ROS (+)   Anesthesia Other Findings Past Medical History: No date: Abdominal hernia     Comment:  umbilical No date: Chronic kidney disease (CKD), active medical management  without dialysis No date: Chronic kidney disease, stage III (moderate) No date: Coronary artery disease No date: GERD (gastroesophageal reflux disease) No date: History of kidney stones     Comment:  h/o No date: Kidney stones 2013: MI (myocardial infarction) ([place])     Comment:  1 stent placed-sees Dr End(cardiologist) last seen in               nov 2018  Past Surgical History: 2013: COLONOSCOPY 2013: CORONARY ANGIOPLASTY WITH STENT PLACEMENT     Comment:  [place] 01/05/2017: ESOPHAGOGASTRODUODENOSCOPY (EGD) WITH PROPOFOL; N/A     Comment:  Procedure: ESOPHAGOGASTRODUODENOSCOPY (EGD) WITH               PROPOFOL With Dilation;  Surgeon: Lin Landsman,               MD;  Location: [facility] END[facility]COPY;  Service:               Gastroenterology;   Laterality: N/A; 07/03/2019: ESOPHAGOGASTRODUODENOSCOPY (EGD) WITH PROPOFOL; N/A     Comment:  Procedure: ESOPHAGOGASTRODUODENOSCOPY (EGD) WITH               PROPOFOL;  Surgeon: Lin Landsman, MD;  Location:               [facility] ENDOSCOPY;  Service: Gastroenterology;  Laterality:               N/A; No date: HERNIA REPAIR 2014: LITHOTRIPSY 41/28/7867: UMBILICAL HERNIA REPAIR; N/A     Comment:  Primary repair 2 cm umbilical hernia;  Surgeon: Robert Bellow, MD;  Location: [facility] ORS;  Service: General;                Laterality: N/A;  BMI    Body Mass Index: 30.27 kg/m      Reproductive/Obstetrics negative OB ROS                             Anesthesia Physical Anesthesia Plan  ASA: III  Anesthesia Plan: General   Post-op Pain Management:    Induction: Intravenous  PONV Risk Score and Plan: 2 and Ondansetron and Treatment may vary due to age or medical condition  Airway Management Planned: Oral ETT  Additional  Equipment:   Intra-op Plan:   Post-operative Plan: Extubation in OR  Informed Consent: I have reviewed the patients History and Physical, chart, labs and discussed the procedure including the risks, benefits and alternatives for the proposed anesthesia with the patient or authorized representative who has indicated his/her understanding and acceptance.     Dental Advisory Given  Plan Discussed with: Anesthesiologist, CRNA and Surgeon  Anesthesia Plan Comments: (Patient consented for risks of anesthesia including but not limited to:  - adverse reactions to medications - risk of intubation if required - damage to eyes, teeth, lips or other oral mucosa - nerve damage due to positioning  - sore throat or hoarseness - Damage to heart, brain, nerves, lungs, other parts of body or loss of life  Patient voiced understanding.)        Anesthesia Quick Evaluation

## 2022-03-29 NOTE — Interval H&P Note (Signed)
History and Physical Interval Note:  CV:RRR Lungs: clear  03/29/2022 8:23 AM  Kevin Melton  has presented today for surgery, with the diagnosis of Right Ureteral Stone, Left Hydroureter.  The various methods of treatment have been discussed with the patient and family. After consideration of risks, benefits and other options for treatment, the patient has consented to  Procedure(s): CYSTOSCOPY/URETEROSCOPY/HOLMIUM LASER/STENT PLACEMENT (Right) DIAGNOSTIC URETEROSCOPY (Left) CYSTOSCOPY WITH BLADDER BIOPSY (N/A) URETERAL BIOPSY (Left) as a surgical intervention.  The patient's history has been reviewed, patient examined, no change in status, stable for surgery.  I have reviewed the patient's chart and labs.  Questions were answered to the patient's satisfaction.     [place]

## 2022-03-29 NOTE — Transfer of Care (Signed)
Immediate Anesthesia Transfer of Care Note  Patient: Kevin Melton  Procedure(s) Performed: CYSTOSCOPY/URETEROSCOPY/HOLMIUM LASER/STENT PLACEMENT/BILATERAL RETROGRADE PYLEOGRAM (Right) DIAGNOSTIC URETEROSCOPY/LEFT STENT PLACEMENT (Left: Ureter)  Patient Location: PACU  Anesthesia Type:General  Level of Consciousness: awake  Airway & Oxygen Therapy: Patient Spontanous Breathing and Patient connected to face mask  Post-op Assessment: Report given to RN, Post -op Vital signs reviewed and stable, and Patient moving all extremities X 4  Post vital signs: Reviewed  Last Vitals:  Vitals Value Taken Time  BP 129/67 03/29/22 1016  Temp 97.8 F   Pulse 71 03/29/22 1020  Resp 20 03/29/22 1020  SpO2 98 % 03/29/22 1020  Vitals shown include unvalidated device data.  Last Pain:  Vitals:   03/29/22 0806  TempSrc: Oral  PainSc: 4          Complications: No notable events documented.

## 2022-03-29 NOTE — Anesthesia Procedure Notes (Signed)
Procedure Name: Intubation Date/Time: 03/29/2022 8:46 AM  Performed by: Otho Perl, CRNAPre-anesthesia Checklist: Patient identified Patient Re-evaluated:Patient Re-evaluated prior to induction Oxygen Delivery Method: Circle system utilized Preoxygenation: Pre-oxygenation with 100% oxygen Induction Type: IV induction Ventilation: Mask ventilation without difficulty and Oral airway inserted - appropriate to patient size Laryngoscope Size: Mac, McGraph and 4 Grade View: Grade I Tube type: Oral Tube size: 7.5 mm Number of attempts: 1 Airway Equipment and Method: Stylet Placement Confirmation: ETT inserted through vocal cords under direct vision, positive ETCO2 and breath sounds checked- equal and bilateral Secured at: 22 cm Tube secured with: Tape Dental Injury: Teeth and Oropharynx as per pre-operative assessment

## 2022-03-29 NOTE — Op Note (Signed)
Preoperative diagnosis:  Right proximal ureteral calculus with mild hydronephrosis Moderate-severe left hydronephrosis/hydroureter to the left distal ureter Filling defect left collecting system on CT urogram  Postoperative diagnosis:  Same  Procedure: Right ureteroscopy with laser lithotripsy/stone removal Placement right ureteral stent Left diagnostic ureteroscopy Left ureteral stent placement Bilateral retrograde pyelogram  Surgeon: Abbie Sons, MD  Anesthesia: General  Complications: None  Intraoperative findings:   Cystoscopy-urethra normal, without stricture.  Mild-moderate lateral lobe enlargement with moderate bladder neck elevation.  Bladder mucosa without solid or papillary lesions.  Bilateral urethral diverticula with UOs just at the medial edge bilaterally.  Inflammatory changes bladder base secondary to indwelling Foley  EBL: Minimal  Specimens: Saline washing left renal pelvis for cytology  Indication: Kevin Melton is a 72 y.o. patient with a history of BPH, nephrolithiasis and a contractile bladder managed with CIC.  Recent CT urogram showed mild right hydronephrosis secondary to a 7 mm right proximal ureteral calculus.  Left renal scarring was noted with calyceal blunting, left hydroureter and diffuse left urothelial enhancement filling defects were noted in the left collecting system felt to represent possible blood clot versus sloughed papilla.  After reviewing the management options for treatment, he elected to proceed with the above surgical procedure(s). We have discussed the potential benefits and risks of the procedure, side effects of the proposed treatment, the likelihood of the patient achieving the goals of the procedure, and any potential problems that might occur during the procedure or recuperation. Informed consent has been obtained.  Description of procedure:  The patient was taken to the operating room and general anesthesia was  induced.  The patient was placed in the dorsal lithotomy position, prepped and draped in the usual sterile fashion, and preoperative antibiotics were administered. A preoperative time-out was performed.   A 21 French cystoscope was lubricated, passed per urethra and advanced proximally under direct vision with findings as described above.  A 6 French open-ended ureteral catheter was placed through the cystoscope and positioned at the right ureteral orifice which was just at the medial aspect of the right-sided diverticulum.  A 0.038 Sensor wire was then placed through the catheter and into the right UO and advanced proximally and fluoroscopic guidance into the region of the right renal pelvis without difficulty.  The cystoscope was removed and repassed and a left-sided Sensor wire was placed into the left UO in a similar fashion.  A 4.5 French semirigid ureteroscope was then passed per urethra.  The scope was advanced into the right ureter alongside the guidewire without difficulty and advance to the proximal ureter where the calculus was identified.  The calculus was unable to be placed in an Escape basket and migrated to the kidney.  A second sensor wire was placed through this and made ureteroscope followed by scope removal.  A single channel digital flexible ureteroscope was then advanced over the guidewire however would not advance through the distal ureter.  The Sensor wire was replaced with a Super Stiff wire and the flexible scope was easily advanced over the Super Stiff wire.  The scope was advanced into the renal pelvis.  Retrograde pyelogram was performed.  No filling defects were noted.  All calyces were examined and the calculus was identified in an upper pole calyx.  A 200 m Moses holmium laser fiber was then placed through the ureteroscope and the calculus was dusted at a setting of 0.3J/80 Hz.  Once completely treated there were no fragments identified larger than the  tip of the laser fiber.   The ureteroscope was removed and a 83F/26 cm contour ureteral stent was placed over the safety wire under fluoroscopic guidance with good positioning noted proximally and distally.  The 4.5 French semirigid ureteroscope was then passed per urethra and into the left distal ureter.  An approximately 5 mm narrowed area was noted in the ureter which is able to be negotiated with the ureteroscope over a guidewire.  The ureteral mucosa was normal in appearance and no lesions or stone noted.  The ureter proximal to this area was significantly dilated.  The ureteroscope was advanced to the UPJ and no abnormalities were noted.  A second guidewire is placed through the semirigid ureteroscope followed by scope removal.  The single channel digital flexible ureteroscope was then advanced over the wire into the left distal ureter however would not advance beyond this narrowed area.  The guidewire was exchanged for a Super Stiff wire and the ureteroscope was able to be advanced through the narrowed area.  The flexible scope was advanced to the renal pelvis.  Retrograde pyelogram was performed through the scope and no filling defects were noted.  All calyces were examined under fluoroscopic guidance and no abnormalities were noted including calculi, tumor or clot.  A saline washing was performed through the ureteroscope and sent for cytology.  The ureteroscope was removed under direct vision.  The narrowed area in the distal ureter was now easily advanced with flexible scope and no additional dilation was performed.  The ureteroscope was removed and an 8 French/24 cm Contour ureteral stent was placed on fluoroscopic guidance with good positioning noted proximally and distally.   Plan: The right ureteral stent may be removed in 7 days. The left ureteral stent will remain indwelling for 6 weeks   Abbie Sons, M.D.

## 2022-03-29 NOTE — Discharge Instructions (Addendum)
DISCHARGE INSTRUCTIONS FOR KIDNEY STONE/URETERAL STENT   MEDICATIONS:  1. Resume all your other meds from home.  2.  AZO (over-the-counter) can help with the burning/stinging when you urinate. 3.  Hydrocodone is for moderate/severe pain, Rx was sent to your pharmacy.  ACTIVITY:  1. May resume regular activities in 24 hours. 2. No driving while on narcotic pain medications  3. Drink plenty of water  4. Continue to walk at home - you can still get blood clots when you are at home, so keep active, but don't over do it.  5. May return to work/school tomorrow or when you feel ready  6.  You may resume intermittent catheterization  BATHING:  1. You can shower.   SIGNS/SYMPTOMS TO CALL:  Common postoperative symptoms include urinary frequency, urgency, bladder spasm and blood in the urine  Please call us if you have a fever greater than 101.5, uncontrolled nausea/vomiting, uncontrolled pain, dizziness, unable to urinate, excessively bloody urine, chest pain, shortness of breath, leg swelling, leg pain, or any other concerns or questions.   You can reach [place] at [phone].   FOLLOW-UP:  1.  Our office will contact you for a follow-up appointment for stent removal  AMBULATORY SURGERY  DISCHARGE INSTRUCTIONS   The drugs that you were given will stay in your system until tomorrow so for the next 24 hours you should not:  Drive an automobile Make any legal decisions Drink any alcoholic beverage   You may resume regular meals tomorrow.  Today it is better to start with liquids and gradually work up to solid foods.  You may eat anything you prefer, but it is better to start with liquids, then soup and crackers, and gradually work up to solid foods.   Please notify your doctor immediately if you have any unusual bleeding, trouble breathing, redness and pain at the surgery site, drainage, fever, or pain not relieved by medication.    Additional Instructions:        Please  contact your physician with any problems or Same Day Surgery at [phone], Monday through Friday 6 am to 4 pm, or [place] at [facility] number at [phone].

## 2022-03-30 ENCOUNTER — Encounter: Payer: Self-pay | Admitting: Urology

## 2022-03-30 LAB — CYTOLOGY - NON PAP

## 2022-03-30 NOTE — Anesthesia Postprocedure Evaluation (Signed)
Anesthesia Post Note  Patient: Kevin Melton  Procedure(s) Performed: CYSTOSCOPY/URETEROSCOPY/HOLMIUM LASER/STENT PLACEMENT/BILATERAL RETROGRADE PYLEOGRAM (Right) DIAGNOSTIC URETEROSCOPY/LEFT STENT PLACEMENT (Left: Ureter)  Patient location during evaluation: PACU Anesthesia Type: General Level of consciousness: awake and alert Pain management: pain level controlled Vital Signs Assessment: post-procedure vital signs reviewed and stable Respiratory status: spontaneous breathing, nonlabored ventilation, respiratory function stable and patient connected to nasal cannula oxygen Cardiovascular status: blood pressure returned to baseline and stable Postop Assessment: no apparent nausea or vomiting Anesthetic complications: no   No notable events documented.   Last Vitals:  Vitals:   03/29/22 1100 03/29/22 1107  BP: 135/63 (!) 143/66  Pulse: 60 (!) 59  Resp: 16 16  Temp: (!) 36.4 C 36.9 C  SpO2: 95% 98%    Last Pain:  Vitals:   03/29/22 1107  TempSrc: Temporal  PainSc: 0-No pain                 Molli Barrows

## 2022-04-03 ENCOUNTER — Other Ambulatory Visit: Payer: Self-pay | Admitting: Family Medicine

## 2022-04-04 NOTE — Telephone Encounter (Signed)
Requested Prescriptions  Pending Prescriptions Disp Refills   amLODipine (NORVASC) 10 MG tablet [Pharmacy Med Name: AMLODIPINE BESYLATE 10 MG TAB] 90 tablet 0    Sig: TAKE 1 TABLET BY MOUTH EVERY DAY     Cardiovascular: Calcium Channel Blockers 2 Failed - 04/03/2022  8:24 AM      Failed - Last BP in normal range    BP Readings from Last 1 Encounters:  03/29/22 (!) 143/66         Passed - Last Heart Rate in normal range    Pulse Readings from Last 1 Encounters:  03/29/22 (!) 27         Passed - Valid encounter within last 6 months    Recent Outpatient Visits           5 months ago Essential hypertension with goal blood pressure less than 130/80   [facility] Myles Gip, DO   11 months ago Annual physical exam   [place], Dionne Bucy, MD   1 year ago Essential hypertension with goal blood pressure less than 130/80   [facility] [place], Dionne Bucy, MD   1 year ago Essential hypertension with goal blood pressure less than 130/80    [facility] [place], Dionne Bucy, MD   1 year ago Atypical mole   [facility] [place], Dionne Bucy, MD

## 2022-04-08 ENCOUNTER — Encounter: Payer: Self-pay | Admitting: Urology

## 2022-04-25 ENCOUNTER — Other Ambulatory Visit: Payer: Self-pay | Admitting: Family Medicine

## 2022-05-04 ENCOUNTER — Ambulatory Visit (INDEPENDENT_AMBULATORY_CARE_PROVIDER_SITE_OTHER): Payer: PPO

## 2022-05-04 VITALS — BP 110/62 | Ht 69.0 in | Wt 202.9 lb

## 2022-05-04 DIAGNOSIS — Z0000 Encounter for general adult medical examination without abnormal findings: Secondary | ICD-10-CM | POA: Diagnosis not present

## 2022-05-04 NOTE — Progress Notes (Signed)
Subjective:   Kevin Melton is a 72 y.o. male who presents for Medicare Annual/Subsequent preventive examination.  Review of Systems    Cardiac Risk Factors include: advanced age (>7mn, >>76women);dyslipidemia;hypertension;male gender;sedentary lifestyle     Objective:    Today's Vitals   05/04/22 0928  BP: 110/62  Weight: 202 lb 14.4 oz (92 kg)  Height: '5\' 9"'$  (1.753 m)   Body mass index is 29.96 kg/m.     05/04/2022    9:47 AM 03/24/2022   10:12 AM 02/28/2022    6:25 AM 12/08/2021    9:08 PM 05/03/2021    9:50 AM 04/27/2020    9:48 AM 11/04/2019    9:14 AM  Advanced Directives  Does Patient Have a Medical Advance Directive? No No No No No No No  Would patient like information on creating a medical advance directive?   No - Patient declined  No - Patient declined No - Patient declined No - Patient declined    Current Medications (verified) Outpatient Encounter Medications as of 05/04/2022  Medication Sig   allopurinol (ZYLOPRIM) 300 MG tablet TAKE 1 TABLET BY MOUTH EVERYDAY AT BEDTIME   amLODipine (NORVASC) 10 MG tablet TAKE 1 TABLET BY MOUTH EVERY DAY   aspirin 81 MG chewable tablet Chew 81 mg by mouth at bedtime.    atorvastatin (LIPITOR) 40 MG tablet TAKE 1 TABLET BY MOUTH EVERY DAY   benazepril (LOTENSIN) 40 MG tablet TAKE 1 TABLET BY MOUTH EVERY DAY   finasteride (PROSCAR) 5 MG tablet Take 1 tablet (5 mg total) by mouth daily.   HYDROcodone-acetaminophen (NORCO/VICODIN) 5-325 MG tablet Take 1 tablet by mouth every 6 (six) hours as needed for moderate pain. (Patient not taking: Reported on 05/04/2022)   nitroGLYCERIN (NITROSTAT) 0.4 MG SL tablet Place 1 tablet (0.4 mg total) under the tongue every 5 (five) minutes as needed for chest pain.   ondansetron (ZOFRAN-ODT) 4 MG disintegrating tablet Take 1 tablet (4 mg total) by mouth every 8 (eight) hours as needed for nausea or vomiting. (Patient not taking: Reported on 05/04/2022)   oxybutynin (DITROPAN) 5 MG tablet  Take 1 tablet (5 mg total) by mouth every 8 (eight) hours as needed for bladder spasms.   [DISCONTINUED] allopurinol (ZYLOPRIM) 300 MG tablet TAKE 1 TABLET BY MOUTH EVERYDAY AT BEDTIME   [DISCONTINUED] amLODipine (NORVASC) 10 MG tablet Take 1 tablet (10 mg total) by mouth daily.   [DISCONTINUED] atorvastatin (LIPITOR) 40 MG tablet TAKE 1 TABLET BY MOUTH EVERY DAY   [DISCONTINUED] benazepril (LOTENSIN) 40 MG tablet TAKE 1 TABLET BY MOUTH EVERY DAY   [DISCONTINUED] nitroGLYCERIN (NITROSTAT) 0.4 MG SL tablet Place 1 tablet (0.4 mg total) under the tongue every 5 (five) minutes as needed for chest pain.   [DISCONTINUED] silodosin (RAPAFLO) 8 MG CAPS capsule Take 1 capsule (8 mg total) by mouth daily with breakfast.   No facility-administered encounter medications on file as of 05/04/2022.    Allergies (verified) Patient has no known allergies.   History: Past Medical History:  Diagnosis Date   Acontractile bladder 06/17/2021   a.) secondary to BPH/detrusor hypotonicity; b.) requires self CIC 4x/day   Aortic atherosclerosis (HCC)    BPH (benign prostatic hyperplasia)    Bradycardia    Chronic kidney disease, stage III (moderate) ([place]    Coronary artery disease    a. 2013 Inf MI/PCI: BMS to RCA; b. 11/2013 MV: small reversible apical anteroseptal defect/ischemia. EF 60%; c. 11/2020 MV: small, fixed basal inf defect -  scar vs artifact. No ischemia. EF 45-50% (60-65% by echo).   Diastolic dysfunction    a.) TTE 11/19/2020: EF 60-65%, mild LVH, mild MR/AR, G1DD   Dyshidrotic eczema    GERD (gastroesophageal reflux disease)    Gout    Hepatic cyst    History of umbilical hernia repair    Hyperlipidemia    Hypertension    Inferior MI ([employer]) 04/2011   a.) MI 04/2011 --> treated in [place] --> BMS (unknown type) to RCA   Kidney stones    Prediabetes    RBBB    Sigmoid diverticulosis    Skin cancer    Past Surgical History:  Procedure Laterality Date   COLONOSCOPY  2013   CORONARY  ANGIOPLASTY WITH STENT PLACEMENT  2013   [place]   CYSTOSCOPY/URETEROSCOPY/HOLMIUM LASER/STENT PLACEMENT Right 03/29/2022   Procedure: CYSTOSCOPY/URETEROSCOPY/HOLMIUM LASER/STENT PLACEMENT/BILATERAL RETROGRADE [id number];  Surgeon: Abbie Sons, MD;  Location: [facility] ORS;  Service: Urology;  Laterality: Right;   ESOPHAGOGASTRODUODENOSCOPY N/A 12/08/2021   Procedure: ESOPHAGOGASTRODUODENOSCOPY (EGD);  Surgeon: Toledo, Benay Pike, MD;  Location: [facility] ENDOSCOPY;  Service: Gastroenterology;  Laterality: N/A;   ESOPHAGOGASTRODUODENOSCOPY (EGD) WITH PROPOFOL N/A 01/05/2017   Procedure: ESOPHAGOGASTRODUODENOSCOPY (EGD) WITH PROPOFOL With Dilation;  Surgeon: Lin Landsman, MD;  Location: [facility] ENDOSCOPY;  Service: Gastroenterology;  Laterality: N/A;   ESOPHAGOGASTRODUODENOSCOPY (EGD) WITH PROPOFOL N/A 07/03/2019   Procedure: ESOPHAGOGASTRODUODENOSCOPY (EGD) WITH PROPOFOL;  Surgeon: Lin Landsman, MD;  [facility] END[facility]COPY;  Service: Gastroenterology;  Laterality: N/A;   ESOPHAGOGASTRODUODENOSCOPY (EGD) WITH PROPOFOL N/A 11/04/2019   Procedure: ESOPHAGOGASTRODUODENOSCOPY (EGD) WITH PROPOFOL;  Surgeon: Lin Landsman, MD;  Location: [facility] ENDOSCOPY;  Service: Gastroenterology;  Laterality: N/A;   HERNIA REPAIR     LITHOTRIPSY  [id number]   UMBILICAL HERNIA REPAIR N/A 02/20/2017   Primary repair 2 cm umbilical hernia;  Surgeon: Robert Bellow, MD;  Location: [facility] ORS;  Service: General;  Laterality: N/A;   URETEROSCOPY Left 03/29/2022   Procedure: DIAGNOSTIC URETEROSCOPY/LEFT STENT PLACEMENT;  Surgeon: Abbie Sons, MD;  Location: [facility] ORS;  Service: Urology;  Laterality: Left;   Family History  Problem Relation Age of Onset   Dementia Mother 34   Lung disease Father 28       collapsed lung   Healthy Sister    Healthy Brother    Heart Problems Brother    Healthy Son    Healthy Sister    Colon cancer Neg Hx    Prostate cancer Neg Hx    Heart disease Neg Hx    Social History    Socioeconomic History   Marital status: Married    Spouse name: [name]   Number of children: 1   Years of education: 12   Highest education level: High school graduate  Occupational History   Occupation: Self Employed    Comment: [employer]: semi-retired  Tobacco Use   Smoking status: Never   Smokeless tobacco: Never  Vaping Use   Vaping Use: Never used  Substance and Sexual Activity   Alcohol use: No   Drug use: No   Sexual activity: Yes    Partners: Female  Other Topics Concern   Not on file  Social History Narrative   Not on file   Social Determinants of Health   Financial Resource Strain: Low Risk  (05/04/2022)   Overall Financial Resource Strain (CARDIA)    Difficulty of Paying Living Expenses: Not hard at all  Food Insecurity: No Food Insecurity (  05/04/2022)   Hunger Vital Sign    Worried About Running Out of Food in the Last Year: Never true    [place] in the Last Year: Never true  Transportation Needs: No Transportation Needs (05/04/2022)   PRAPARE - Hydrologist (Medical): No    Lack of Transportation (Non-Medical): No  Physical Activity: Inactive (05/04/2022)   Exercise Vital Sign    Days of Exercise per Week: 0 days    Minutes of Exercise per Session: 0 min  Stress: No Stress Concern Present (05/04/2022)   [facility]    Feeling of Stress : Not at all  Social Connections: Moderately Isolated (05/04/2022)   Social Connection and Isolation Panel [NHANES]    Frequency of Communication with Friends and Family: More than three times a week    Frequency of Social Gatherings with Friends and Family: Once a week    Attends Religious Services: Never    Marine scientist or Organizations: No    Attends Music therapist: Never    Marital Status: Married    Tobacco Counseling Counseling given: Not  Answered   Clinical Intake:  Pre-visit preparation completed: Yes  Pain : No/denies pain     BMI - recorded: 29.95 Nutritional Status: BMI 25 -29 Overweight Nutritional Risks: None Diabetes: No  How often do you need to have someone help you when you read instructions, pamphlets, or other written materials from your doctor or pharmacy?: 1 - Never  Diabetic?no  Interpreter Needed?: No  Information entered by :: B.Rein Popov,LPN   Activities of Daily Living    05/04/2022    9:47 AM 03/24/2022   10:13 AM  In your present state of health, do you have any difficulty performing the following activities:  Hearing? 1   Vision? 0   Difficulty concentrating or making decisions? 0   Walking or climbing stairs? 0   Dressing or bathing? 0   Doing errands, shopping? 0 0  Preparing Food and eating ? N   Using the Toilet? N   In the past six months, have you accidently leaked urine? N   Comment pt caths himself 4xdaily   Do you have problems with loss of bowel control? N   Managing your Medications? N   Managing your Finances? N   Housekeeping or managing your Housekeeping? N     Patient Care Team: Virginia Crews, MD as PCP - General (Family Medicine) End, Harrell Gave, MD as PCP - Cardiology (Cardiology)  Indicate any recent Medical Services you may have received from other than [facility] providers in the past year (date may be approximate).     Assessment:   This is a routine wellness examination for [place].  Hearing/Vision screen Hearing Screening - Comments:: Hearing little less than adequate Vision Screening - Comments:: Adequate vision w/glasses  [facility]  Dietary issues and exercise activities discussed: Current Exercise Habits: The patient does not participate in regular exercise at present, Exercise limited by: None identified   Goals Addressed             This Visit's Progress    DIET - EAT MORE FRUITS AND VEGETABLES   On track    Exercise 3x per  week (30 min per time)   Not on track    Recommend to start fast walking three times a week for at least 30 minutes at a time.  Depression Screen    05/04/2022    9:36 AM 10/18/2021    8:30 AM 05/03/2021    9:49 AM 02/19/2021   10:18 AM 10/29/2020    9:10 AM 05/25/2020    3:06 PM 04/27/2020    9:46 AM  PHQ 2/9 Scores  PHQ - 2 Score 0 0 0 0 0 0 0  PHQ- 9 Score     0 0     Fall Risk    05/04/2022    9:34 AM 10/18/2021    8:30 AM 05/03/2021    9:50 AM 02/19/2021   10:18 AM 10/29/2020    9:10 AM  Fall Risk   Falls in the past year? 0 0 0 0 0  Number falls in past yr: 0 0 0 0 0  Injury with Fall? 0 0 0 0 0  Risk for fall due to : No Fall Risks  No Fall Risks  No Fall Risks  Follow up Education provided;Falls prevention discussed  Falls evaluation completed      FALL RISK PREVENTION PERTAINING TO THE HOME:  Any stairs in or around the home? No  If so, are there any without handrails? No  Home free of loose throw rugs in walkways, pet beds, electrical cords, etc? Yes  Adequate lighting in your home to reduce risk of falls? Yes   ASSISTIVE DEVICES UTILIZED TO PREVENT FALLS:  Life alert? No  Use of a cane, walker or w/c? No  Grab bars in the bathroom? Yes  Shower chair or bench in shower? Yes  Elevated toilet seat or a handicapped toilet? Yes   TIMED UP AND GO:  Was the test performed? Yes .  Length of time to ambulate 10 feet: 8 sec.   Gait steady and fast without use of assistive device  Cognitive Function:        05/04/2022    9:40 AM 05/03/2021   10:26 AM 04/19/2018    8:46 AM  6CIT Screen  What Year? 0 points 0 points 0 points  What month? 0 points 0 points 0 points  What time? 0 points 0 points 0 points  Count back from 20 0 points 0 points 0 points  Months in reverse 0 points 0 points 0 points  Repeat phrase 0 points 0 points 0 points  Total Score 0 points 0 points 0 points    Immunizations Immunization History  Administered Date(s) Administered    Fluad Quad(high Dose 65+) 10/26/2018, 02/19/2021   Influenza, High Dose Seasonal PF 11/10/2016, 12/19/2017   Moderna Sars-Covid-2 Vaccination 04/05/2019, 05/01/2019, 01/05/2020   Pneumococcal Conjugate-13 11/10/2016   Pneumococcal Polysaccharide-23 04/19/2018    TDAP status: Due, Education has been provided regarding the importance of this vaccine. Advised may receive this vaccine at local pharmacy or Health Dept. Aware to provide a copy of the vaccination record if obtained from local pharmacy or Health Dept. Verbalized acceptance and understanding.  Flu Vaccine status: Up to date  Pneumococcal vaccine status: Up to date  Covid-19 vaccine status: Completed vaccines  Qualifies for Shingles Vaccine? Yes   Zostavax completed No   Shingrix Completed?: No.    Education has been provided regarding the importance of this vaccine. Patient has been advised to call insurance company to determine out of pocket expense if they have not yet received this vaccine. Advised may also receive vaccine at local pharmacy or Health Dept. Verbalized acceptance and understanding.  Screening Tests Health Maintenance  Topic Date Due   DTaP/Tdap/Td (1 -  Tdap) Never done   Zoster Vaccines- Shingrix (1 of 2) Never done   INFLUENZA VACCINE  10/05/2021   COVID-19 Vaccine (4 - 2023-24 season) 11/05/2021   Medicare Annual Wellness (AWV)  05/05/2023   Fecal DNA (Cologuard)  05/12/2023   Pneumonia Vaccine 59+ Years old  Completed   Hepatitis C Screening  Completed   HPV VACCINES  Aged Out    Health Maintenance  Health Maintenance Due  Topic Date Due   DTaP/Tdap/Td (1 - Tdap) Never done   Zoster Vaccines- Shingrix (1 of 2) Never done   INFLUENZA VACCINE  10/05/2021   COVID-19 Vaccine (4 - 2023-24 season) 11/05/2021    Colorectal cancer screening: Type of screening: Cologuard. Completed 3. Repeat every 3 years Colonoscopy 10 years ago  Lung Cancer Screening: (Low Dose CT Chest recommended if Age 69-80  years, 30 pack-year currently smoking OR have quit w/in 15years.) does not qualify.   Lung Cancer Screening Referral: no  Additional Screening:  Hepatitis C Screening: does not qualify; Completed yes  Vision Screening: Recommended annual ophthalmology exams for early detection of glaucoma and other disorders of the eye. Is the patient up to date with their annual eye exam?  Yes  Who is the provider or what is the name of the office in which the patient attends annual eye exams? [facility] If pt is not established with a provider, would they like to be referred to a provider to establish care? No .   Dental Screening: Recommended annual dental exams for proper oral hygiene  Community Resource Referral / Chronic Care Management: CRR required this visit?  No   CCM required this visit?  No      Plan:     I have personally reviewed and noted the following in the patient's chart:   Medical and social history Use of alcohol, tobacco or illicit drugs  Current medications and supplements including opioid prescriptions. Patient is not currently taking opioid prescriptions. Functional ability and status Nutritional status Physical activity Advanced directives List of other physicians Hospitalizations, surgeries, and ER visits in previous 12 months Vitals Screenings to include cognitive, depression, and falls Referrals and appointments  In addition, I have reviewed and discussed with patient certain preventive protocols, quality metrics, and best practice recommendations. A written personalized care plan for preventive services as well as general preventive health recommendations were provided to patient.     Roger Shelter, LPN   [id number]   Nurse Notes: pt  is doing well continues to take care of his wife who he states is handicapped. Pt voices no concerns or questions at this visit.

## 2022-05-04 NOTE — Patient Instructions (Signed)
Mr. Kevin Melton , Thank you for taking time to come for your Medicare Wellness Visit. I appreciate your ongoing commitment to your health goals. Please review the following plan we discussed and let me know if I can assist you in the future.   These are the goals we discussed:  Goals      DIET - EAT MORE FRUITS AND VEGETABLES     Exercise 3x per week (30 min per time)     Recommend to start fast walking three times a week for at least 30 minutes at a time.         This is a list of the screening recommended for you and due dates:  Health Maintenance  Topic Date Due   DTaP/Tdap/Td vaccine (1 - Tdap) Never done   Zoster (Shingles) Vaccine (1 of 2) Never done   Flu Shot  10/05/2021   COVID-19 Vaccine (4 - 2023-24 season) 11/05/2021   Medicare Annual Wellness Visit  05/05/2023   Cologuard (Stool DNA test)  05/12/2023   Pneumonia Vaccine  Completed   Hepatitis C Screening: USPSTF Recommendation to screen - Ages 18-79 yo.  Completed   HPV Vaccine  Aged Out    Advanced directives: no they have paperwork  Conditions/risks identified: none  Next appointment: Follow up in one year for your annual wellness visit. 05/09/2023@9$ :15am in person  Preventive Care 65 Years and Older, Male  Preventive care refers to lifestyle choices and visits with your health care provider that can promote health and wellness. What does preventive care include? A yearly physical exam. This is also called an annual well check. Dental exams once or twice a year. Routine eye exams. Ask your health care provider how often you should have your eyes checked. Personal lifestyle choices, including: Daily care of your teeth and gums. Regular physical activity. Eating a healthy diet. Avoiding tobacco and drug use. Limiting alcohol use. Practicing safe sex. Taking low doses of aspirin every day. Taking vitamin and mineral supplements as recommended by your health care provider. What happens during an annual well  check? The services and screenings done by your health care provider during your annual well check will depend on your age, overall health, lifestyle risk factors, and family history of disease. Counseling  Your health care provider may ask you questions about your: Alcohol use. Tobacco use. Drug use. Emotional well-being. Home and relationship well-being. Sexual activity. Eating habits. History of falls. Memory and ability to understand (cognition). Work and work Statistician. Screening  You may have the following tests or measurements: Height, weight, and BMI. Blood pressure. Lipid and cholesterol levels. These may be checked every 5 years, or more frequently if you are over 70 years old. Skin check. Lung cancer screening. You may have this screening every year starting at age 69 if you have a 30-pack-year history of smoking and currently smoke or have quit within the past 15 years. Fecal occult blood test (FOBT) of the stool. You may have this test every year starting at age 32. Flexible sigmoidoscopy or colonoscopy. You may have a sigmoidoscopy every 5 years or a colonoscopy every 10 years starting at age 73. Prostate cancer screening. Recommendations will vary depending on your family history and other risks. Hepatitis C blood test. Hepatitis B blood test. Sexually transmitted disease (STD) testing. Diabetes screening. This is done by checking your blood sugar (glucose) after you have not eaten for a while (fasting). You may have this done every 1-3 years. Abdominal aortic aneurysm (AAA)  screening. You may need this if you are a current or former smoker. Osteoporosis. You may be screened starting at age 43 if you are at high risk. Talk with your health care provider about your test results, treatment options, and if necessary, the need for more tests. Vaccines  Your health care provider may recommend certain vaccines, such as: Influenza vaccine. This is recommended every  year. Tetanus, diphtheria, and acellular pertussis (Tdap, Td) vaccine. You may need a Td booster every 10 years. Zoster vaccine. You may need this after age 52. Pneumococcal 13-valent conjugate (PCV13) vaccine. One dose is recommended after age 110. Pneumococcal polysaccharide (PPSV23) vaccine. One dose is recommended after age 85. Talk to your health care provider about which screenings and vaccines you need and how often you need them. This information is not intended to replace advice given to you by your health care provider. Make sure you discuss any questions you have with your health care provider. Document Released: 03/20/2015 Document Revised: 11/11/2015 Document Reviewed: 12/23/2014 Elsevier Interactive Patient Education  2017 [place] Prevention in the Home Falls can cause injuries. They can happen to people of all ages. There are many things you can do to make your home safe and to help prevent falls. What can I do on the outside of my home? Regularly fix the edges of walkways and driveways and fix any cracks. Remove anything that might make you trip as you walk through a door, such as a raised step or threshold. Trim any bushes or trees on the path to your home. Use bright outdoor lighting. Clear any walking paths of anything that might make someone trip, such as rocks or tools. Regularly check to see if handrails are loose or broken. Make sure that both sides of any steps have handrails. Any raised decks and porches should have guardrails on the edges. Have any leaves, snow, or ice cleared regularly. Use sand or salt on walking paths during winter. Clean up any spills in your garage right away. This includes oil or grease spills. What can I do in the bathroom? Use night lights. Install grab bars by the toilet and in the tub and shower. Do not use towel bars as grab bars. Use non-skid mats or decals in the tub or shower. If you need to sit down in the shower, use a  plastic, non-slip stool. Keep the floor dry. Clean up any water that spills on the floor as soon as it happens. Remove soap buildup in the tub or shower regularly. Attach bath mats securely with double-sided non-slip rug tape. Do not have throw rugs and other things on the floor that can make you trip. What can I do in the bedroom? Use night lights. Make sure that you have a light by your bed that is easy to reach. Do not use any sheets or blankets that are too big for your bed. They should not hang down onto the floor. Have a firm chair that has side arms. You can use this for support while you get dressed. Do not have throw rugs and other things on the floor that can make you trip. What can I do in the kitchen? Clean up any spills right away. Avoid walking on wet floors. Keep items that you use a lot in easy-to-reach places. If you need to reach something above you, use a strong step stool that has a grab bar. Keep electrical cords out of the way. Do not use floor polish or  wax that makes floors slippery. If you must use wax, use non-skid floor wax. Do not have throw rugs and other things on the floor that can make you trip. What can I do with my stairs? Do not leave any items on the stairs. Make sure that there are handrails on both sides of the stairs and use them. Fix handrails that are broken or loose. Make sure that handrails are as long as the stairways. Check any carpeting to make sure that it is firmly attached to the stairs. Fix any carpet that is loose or worn. Avoid having throw rugs at the top or bottom of the stairs. If you do have throw rugs, attach them to the floor with carpet tape. Make sure that you have a light switch at the top of the stairs and the bottom of the stairs. If you do not have them, ask someone to add them for you. What else can I do to help prevent falls? Wear shoes that: Do not have high heels. Have rubber bottoms. Are comfortable and fit you  well. Are closed at the toe. Do not wear sandals. If you use a stepladder: Make sure that it is fully opened. Do not climb a closed stepladder. Make sure that both sides of the stepladder are locked into place. Ask someone to hold it for you, if possible. Clearly mark and make sure that you can see: Any grab bars or handrails. First and last steps. Where the edge of each step is. Use tools that help you move around (mobility aids) if they are needed. These include: Canes. Walkers. Scooters. Crutches. Turn on the lights when you go into a dark area. Replace any light bulbs as soon as they burn out. Set up your furniture so you have a clear path. Avoid moving your furniture around. If any of your floors are uneven, fix them. If there are any pets around you, be aware of where they are. Review your medicines with your doctor. Some medicines can make you feel dizzy. This can increase your chance of falling. Ask your doctor what other things that you can do to help prevent falls. This information is not intended to replace advice given to you by your health care provider. Make sure you discuss any questions you have with your health care provider. Document Released: 12/18/2008 Document Revised: 07/30/2015 Document Reviewed: 03/28/2014 Elsevier Interactive Patient Education  2017 [employer].

## 2022-05-05 DIAGNOSIS — R339 Retention of urine, unspecified: Secondary | ICD-10-CM | POA: Diagnosis not present

## 2022-05-06 ENCOUNTER — Other Ambulatory Visit: Payer: PPO

## 2022-05-06 ENCOUNTER — Other Ambulatory Visit: Payer: Self-pay | Admitting: Family Medicine

## 2022-05-06 DIAGNOSIS — R300 Dysuria: Secondary | ICD-10-CM

## 2022-05-06 LAB — URINALYSIS, COMPLETE
Bilirubin, UA: NEGATIVE
Glucose, UA: NEGATIVE
Nitrite, UA: POSITIVE — AB
Specific Gravity, UA: 1.02 (ref 1.005–1.030)
Urobilinogen, Ur: 2 mg/dL — ABNORMAL HIGH (ref 0.2–1.0)
pH, UA: 5.5 (ref 5.0–7.5)

## 2022-05-06 LAB — MICROSCOPIC EXAMINATION
RBC, Urine: >30 /hpf — AB (ref 0–2)
WBC, UA: >30 /hpf — AB (ref 0–5)

## 2022-05-10 LAB — CULTURE, URINE COMPREHENSIVE

## 2022-05-18 ENCOUNTER — Encounter: Payer: Self-pay | Admitting: Urology

## 2022-05-18 ENCOUNTER — Ambulatory Visit: Payer: PPO | Admitting: Urology

## 2022-05-18 VITALS — BP 162/61 | HR 62 | Ht 70.0 in | Wt 202.0 lb

## 2022-05-18 DIAGNOSIS — N201 Calculus of ureter: Secondary | ICD-10-CM

## 2022-05-18 DIAGNOSIS — N1330 Unspecified hydronephrosis: Secondary | ICD-10-CM

## 2022-05-18 DIAGNOSIS — Z466 Encounter for fitting and adjustment of urinary device: Secondary | ICD-10-CM

## 2022-05-18 LAB — MICROSCOPIC EXAMINATION
RBC, Urine: >30 /hpf — AB (ref 0–2)
WBC, UA: >30 /hpf — AB (ref 0–5)

## 2022-05-18 LAB — URINALYSIS, COMPLETE
Bilirubin, UA: NEGATIVE
Glucose, UA: NEGATIVE
Ketones, UA: NEGATIVE
Nitrite, UA: POSITIVE — AB
Specific Gravity, UA: 1.02 (ref 1.005–1.030)
Urobilinogen, Ur: 1 mg/dL (ref 0.2–1.0)
pH, UA: 5.5 (ref 5.0–7.5)

## 2022-05-18 MED ORDER — SULFAMETHOXAZOLE-TRIMETHOPRIM 800-160 MG PO TABS: 1.0000 | ORAL_TABLET | Freq: Two times a day (BID) | ORAL | 0 refills | Status: AC

## 2022-05-18 MED ORDER — LEVOFLOXACIN 500 MG PO TABS
500.0000 mg | ORAL_TABLET | Freq: Once | ORAL | Status: AC
  Administered 2022-05-18: 500 mg via ORAL

## 2022-05-19 NOTE — Progress Notes (Signed)
Indications: Patient is 72 y.o., who is s/p ureteroscopic removal of a 7 mm right proximal ureteral calculus and diagnostic left ureteroscopy for left hydronephrosis to the distal ureter.  He was found to have narrowing of the left distal ureter.  No filling defects were noted in the left collecting system on retrograde pyelogram.  The flexible ureteroscope could not be advanced into the collecting system.  Saline barbotage for cytology was negative the patient is presenting today for stent removal.  Procedure:  Flexible Cystoscopy with stent removal OK:[id number])  Timeout was performed and the correct patient, procedure and participants were identified.    Description:  The patient was prepped and draped in the usual sterile fashion. Flexible cystosopy was performed.  The left ureteral stent was visualized, grasped, and removed intact without difficulty. The patient tolerated the procedure well.  The cystoscope was repassed and the right ureteral stent was removed in a similar fashion A single dose of oral antibiotics was given.  Complications:  None  Plan:  He has a neurogenic bladder on CIC and UA today was nitrite positive After prophylactic oral antibiotic Rx Septra DS twice daily x 5 days was sent to pharmacy Keep scheduled follow-up with renal ultrasound prior   John Giovanni, MD

## 2022-05-21 LAB — CULTURE, URINE COMPREHENSIVE

## 2022-05-26 ENCOUNTER — Other Ambulatory Visit: Payer: Self-pay | Admitting: Family Medicine

## 2022-05-26 NOTE — Telephone Encounter (Signed)
Requested medication (s) are due for refill today: yes   Requested medication (s) are on the active medication list: yes   Last refill:  11/22/21 #90 1 refills   Future visit scheduled: no  Notes to clinic:  last ordered by A. Rumball, DO  11/22/21. Do you want to refill Rx?     Requested Prescriptions  Pending Prescriptions Disp Refills   allopurinol (ZYLOPRIM) 300 MG tablet [Pharmacy Med Name: ALLOPURINOL 300 MG TABLET] 90 tablet 1    Sig: TAKE 1 TABLET BY MOUTH EVERYDAY AT BEDTIME     Endocrinology:  Gout Agents - allopurinol Failed - 05/26/2022  1:54 AM      Failed - Cr in normal range and within 360 days    Creat  Date Value Ref Range Status  12/13/2016 1.36 (H) 0.70 - 1.25 mg/dL Final    Comment:    For patients >69 years of age, the reference limit for Creatinine is approximately 13% higher for people identified as African-American. .    Creatinine, Ser  Date Value Ref Range Status  02/28/2022 1.31 (H) 0.61 - 1.24 mg/dL Final         Passed - Uric Acid in normal range and within 360 days    Uric Acid  Date Value Ref Range Status  10/18/2021 5.0 3.8 - 8.4 mg/dL Final    Comment:               Therapeutic target for gout patients: <6.0         Passed - Valid encounter within last 12 months    Recent Outpatient Visits           7 months ago Essential hypertension with goal blood pressure less than 130/80   [facility] [place], Jake Church, DO   1 year ago Annual physical exam   [place], Dionne Bucy, MD   1 year ago Essential hypertension with goal blood pressure less than 130/80   [place], Dionne Bucy, MD   1 year ago Essential hypertension with goal blood pressure less than 130/80   [place], Dionne Bucy, MD   2 years ago Atypical mole   [place], Dionne Bucy, MD               Passed - CBC within normal limits and completed in the last 12 months    WBC  Date Value Ref Range Status  02/28/2022 8.5 4.0 - 10.5 K/uL Final   RBC  Date Value Ref Range Status  02/28/2022 4.87 4.22 - 5.81 MIL/uL Final   Hemoglobin  Date Value Ref Range Status  02/28/2022 14.7 13.0 - 17.0 g/dL Final  02/12/2019 14.9 13.0 - 17.7 g/dL Final   HCT  Date Value Ref Range Status  02/28/2022 45.3 39.0 - 52.0 % Final   Hematocrit  Date Value Ref Range Status  02/12/2019 42.9 37.5 - 51.0 % Final   MCHC  Date Value Ref Range Status  02/28/2022 32.5 30.0 - 36.0 g/dL Final   [facility]  Date Value Ref Range Status  02/28/2022 30.2 26.0 - 34.0 pg Final   MCV  Date Value Ref Range Status  02/28/2022 93.0 80.0 - 100.0 fL Final  02/12/2019 88 79 - 97 fL Final   No results found for: "PLTCOUNTKUC", "LABPLAT", "POCPLA" RDW  Date Value Ref Range Status  02/28/2022 13.6 11.5 - 15.5 %  Final  02/12/2019 13.1 11.6 - 15.4 % Final

## 2022-05-30 NOTE — Progress Notes (Signed)
     I,Sha'taria Tyson,acting as a Neurosurgeon for Mila Merry, MD.,have documented all relevant documentation on the behalf of Mila Merry, MD,as directed by  Mila Merry, MD while in the presence of Mila Merry, MD.   Established patient visit   Patient: Kevin Melton   DOB: 08/11/1950   72 y.o. Male  MRN: [id number] Visit Date: 05/31/2022  Today's healthcare provider: Mila Merry, MD    Subjective    Rash This is a new problem. The current episode started yesterday. The problem is unchanged. The affected locations include the back and chest. The rash is characterized by redness, swelling and pain. He was exposed to nothing. Associated symptoms include fatigue. Past treatments include nothing. The treatment provided no relief.  Was sore for a few days before rash. He has never received vaccine.    Medications: Outpatient Medications Prior to Visit  Medication Sig   allopurinol (ZYLOPRIM) 300 MG tablet TAKE 1 TABLET BY MOUTH EVERYDAY AT BEDTIME   amLODipine (NORVASC) 10 MG tablet TAKE 1 TABLET BY MOUTH EVERY DAY   aspirin 81 MG chewable tablet Chew 81 mg by mouth at bedtime.    atorvastatin (LIPITOR) 40 MG tablet TAKE 1 TABLET BY MOUTH EVERY DAY   benazepril (LOTENSIN) 40 MG tablet TAKE 1 TABLET BY MOUTH EVERY DAY   finasteride (PROSCAR) 5 MG tablet Take 1 tablet (5 mg total) by mouth daily.   nitroGLYCERIN (NITROSTAT) 0.4 MG SL tablet Place 1 tablet (0.4 mg total) under the tongue every 5 (five) minutes as needed for chest pain.   oxybutynin (DITROPAN) 5 MG tablet Take 1 tablet (5 mg total) by mouth every 8 (eight) hours as needed for bladder spasms.   No facility-administered medications prior to visit.    Review of Systems  Constitutional:  Positive for fatigue.  Skin:  Positive for rash.       Objective    BP (!) 141/60 (BP Location: Left Arm, Patient Position: Sitting, Cuff Size: Normal)   Pulse (!) 55   Temp 98.9 F (37.2 C) (Oral)   Ht 5\' 9"  (1.753 m)    Wt 199 lb 12.8 oz (90.6 kg)   SpO2 100%   BMI 29.51 kg/m    Physical Exam       Assessment & Plan     1. Herpes zoster without complication  - amitriptyline (ELAVIL) 25 MG tablet; Take 1/2 to 1 tablet twice a day  Dispense: 20 tablet; Refill: 0 - valACYclovir (VALTREX) 1000 MG tablet; Take 1 tablet (1,000 mg total) by mouth 3 (three) times daily for 7 days.  Dispense: 21 tablet; Refill: 0   Counseled on potential for recurrence after current episode resolves. Counseled on recommendation for vaccine to reduce risk of recurrence.      The entirety of the information documented in the History of Present Illness, Review of Systems and Physical Exam were personally obtained by me. Portions of this information were initially documented by the CMA and reviewed by me for thoroughness and accuracy.     Mila Merry, MD  [facility] [phone] (phone) [phone] (fax)  [facility]

## 2022-05-31 ENCOUNTER — Ambulatory Visit (INDEPENDENT_AMBULATORY_CARE_PROVIDER_SITE_OTHER): Payer: PPO | Admitting: Family Medicine

## 2022-05-31 ENCOUNTER — Encounter: Payer: Self-pay | Admitting: Family Medicine

## 2022-05-31 VITALS — BP 141/60 | HR 55 | Temp 98.9°F | Ht 69.0 in | Wt 199.8 lb

## 2022-05-31 DIAGNOSIS — B029 Zoster without complications: Secondary | ICD-10-CM

## 2022-05-31 MED ORDER — VALACYCLOVIR HCL 1 G PO TABS: 1000.0000 mg | ORAL_TABLET | Freq: Three times a day (TID) | ORAL | 0 refills | Status: AC

## 2022-05-31 MED ORDER — AMITRIPTYLINE HCL 25 MG PO TABS: ORAL_TABLET | ORAL | 0 refills | Status: DC

## 2022-06-03 ENCOUNTER — Encounter: Payer: Self-pay | Admitting: Family Medicine

## 2022-06-12 ENCOUNTER — Encounter: Payer: Self-pay | Admitting: Family Medicine

## 2022-06-22 ENCOUNTER — Other Ambulatory Visit: Payer: Self-pay | Admitting: Family Medicine

## 2022-06-23 NOTE — Telephone Encounter (Signed)
Requested Prescriptions  Pending Prescriptions Disp Refills   allopurinol (ZYLOPRIM) 300 MG tablet [Pharmacy Med Name: ALLOPURINOL 300 MG TABLET] 90 tablet 0    Sig: TAKE 1 TABLET BY MOUTH EVERYDAY AT BEDTIME     Endocrinology:  Gout Agents - allopurinol Failed - 06/22/2022  2:32 PM      Failed - Cr in normal range and within 360 days    Creat  Date Value Ref Range Status  12/13/2016 1.36 (H) 0.70 - 1.25 mg/dL Final    Comment:    For patients >43 years of age, the reference limit for Creatinine is approximately 13% higher for people identified as African-American. .    Creatinine, Ser  Date Value Ref Range Status  02/28/2022 1.31 (H) 0.61 - 1.24 mg/dL Final         Passed - Uric Acid in normal range and within 360 days    Uric Acid  Date Value Ref Range Status  10/18/2021 5.0 3.8 - 8.4 mg/dL Final    Comment:               Therapeutic target for gout patients: <6.0         Passed - Valid encounter within last 12 months    Recent Outpatient Visits           3 weeks ago Herpes zoster without complication   [place] [facility] Malva Limes, MD   8 months ago Essential hypertension with goal blood pressure less than 130/80   [facility] [place], Darl Householder, DO   1 year ago Annual physical exam   [facility] [place], Marzella Schlein, MD   1 year ago Essential hypertension with goal blood pressure less than 130/80   [facility] [place], Marzella Schlein, MD   1 year ago Essential hypertension with goal blood pressure less than 130/80   [facility] [place], Marzella Schlein, MD              Passed - CBC within normal limits and completed in the last 12 months    WBC  Date Value Ref Range Status  02/28/2022 8.5 4.0 - 10.5 K/uL Final   RBC  Date Value Ref Range Status  02/28/2022 4.87 4.22 - 5.81 MIL/uL Final   Hemoglobin  Date Value Ref Range Status   02/28/2022 14.7 13.0 - 17.0 g/dL Final  16/12/9602 54.0 13.0 - 17.7 g/dL Final   HCT  Date Value Ref Range Status  02/28/2022 45.3 39.0 - 52.0 % Final   Hematocrit  Date Value Ref Range Status  02/12/2019 42.9 37.5 - 51.0 % Final   MCHC  Date Value Ref Range Status  02/28/2022 32.5 30.0 - 36.0 g/dL Final   [facility]  Date Value Ref Range Status  02/28/2022 30.2 26.0 - 34.0 pg Final   MCV  Date Value Ref Range Status  02/28/2022 93.0 80.0 - 100.0 fL Final  02/12/2019 88 79 - 97 fL Final   No results found for: "PLTCOUNTKUC", "LABPLAT", "POCPLA" RDW  Date Value Ref Range Status  02/28/2022 13.6 11.5 - 15.5 % Final  02/12/2019 13.1 11.6 - 15.4 % Final

## 2022-07-01 ENCOUNTER — Other Ambulatory Visit: Payer: Self-pay | Admitting: Family Medicine

## 2022-07-11 DIAGNOSIS — L821 Other seborrheic keratosis: Secondary | ICD-10-CM | POA: Diagnosis not present

## 2022-07-11 DIAGNOSIS — Z08 Encounter for follow-up examination after completed treatment for malignant neoplasm: Secondary | ICD-10-CM | POA: Diagnosis not present

## 2022-07-11 DIAGNOSIS — D2261 Melanocytic nevi of right upper limb, including shoulder: Secondary | ICD-10-CM | POA: Diagnosis not present

## 2022-07-11 DIAGNOSIS — B029 Zoster without complications: Secondary | ICD-10-CM | POA: Diagnosis not present

## 2022-07-11 DIAGNOSIS — D2272 Melanocytic nevi of left lower limb, including hip: Secondary | ICD-10-CM | POA: Diagnosis not present

## 2022-07-11 DIAGNOSIS — D2262 Melanocytic nevi of left upper limb, including shoulder: Secondary | ICD-10-CM | POA: Diagnosis not present

## 2022-07-11 DIAGNOSIS — Z85820 Personal history of malignant melanoma of skin: Secondary | ICD-10-CM | POA: Diagnosis not present

## 2022-07-11 DIAGNOSIS — D225 Melanocytic nevi of trunk: Secondary | ICD-10-CM | POA: Diagnosis not present

## 2022-07-11 DIAGNOSIS — D2271 Melanocytic nevi of right lower limb, including hip: Secondary | ICD-10-CM | POA: Diagnosis not present

## 2022-07-15 ENCOUNTER — Encounter: Payer: Self-pay | Admitting: Family Medicine

## 2022-07-18 MED ORDER — GABAPENTIN 300 MG PO CAPS
300.0000 mg | ORAL_CAPSULE | Freq: Three times a day (TID) | ORAL | 3 refills | Status: DC
Start: 2022-07-18 — End: 2022-09-01

## 2022-07-29 ENCOUNTER — Ambulatory Visit
Admission: RE | Admit: 2022-07-29 | Discharge: 2022-07-29 | Disposition: A | Discharge status: Home / Self Care | Payer: PPO | Source: Ambulatory Visit | Attending: Urology | Admitting: Urology

## 2022-07-29 DIAGNOSIS — N1330 Unspecified hydronephrosis: Secondary | ICD-10-CM | POA: Diagnosis not present

## 2022-08-02 ENCOUNTER — Other Ambulatory Visit: Payer: PPO

## 2022-08-02 ENCOUNTER — Other Ambulatory Visit: Payer: Self-pay

## 2022-08-02 DIAGNOSIS — N400 Enlarged prostate without lower urinary tract symptoms: Secondary | ICD-10-CM | POA: Diagnosis not present

## 2022-08-02 DIAGNOSIS — R972 Elevated prostate specific antigen [PSA]: Secondary | ICD-10-CM | POA: Diagnosis not present

## 2022-08-03 ENCOUNTER — Ambulatory Visit: Payer: PPO | Admitting: Urology

## 2022-08-03 LAB — PSA: Prostate Specific Ag, Serum: 4.1 ng/mL — ABNORMAL HIGH (ref 0.0–4.0)

## 2022-08-04 ENCOUNTER — Ambulatory Visit
Admission: RE | Admit: 2022-08-04 | Discharge: 2022-08-04 | Disposition: A | Discharge status: Home / Self Care | Payer: PPO | Source: Ambulatory Visit | Attending: Urology | Admitting: Urology

## 2022-08-04 ENCOUNTER — Ambulatory Visit: Payer: PPO | Admitting: Urology

## 2022-08-04 ENCOUNTER — Encounter: Payer: Self-pay | Admitting: Urology

## 2022-08-04 VITALS — BP 138/69 | HR 66 | Ht 69.0 in | Wt 195.0 lb

## 2022-08-04 DIAGNOSIS — Z466 Encounter for fitting and adjustment of urinary device: Secondary | ICD-10-CM | POA: Insufficient documentation

## 2022-08-04 DIAGNOSIS — N201 Calculus of ureter: Secondary | ICD-10-CM | POA: Diagnosis not present

## 2022-08-04 DIAGNOSIS — R339 Retention of urine, unspecified: Secondary | ICD-10-CM

## 2022-08-04 DIAGNOSIS — N1330 Unspecified hydronephrosis: Secondary | ICD-10-CM | POA: Diagnosis not present

## 2022-08-04 DIAGNOSIS — R8271 Bacteriuria: Secondary | ICD-10-CM | POA: Diagnosis not present

## 2022-08-04 DIAGNOSIS — N3001 Acute cystitis with hematuria: Secondary | ICD-10-CM | POA: Diagnosis not present

## 2022-08-04 LAB — URINALYSIS, COMPLETE
Bilirubin, UA: NEGATIVE
Glucose, UA: NEGATIVE
Ketones, UA: NEGATIVE
Nitrite, UA: POSITIVE — AB
Specific Gravity, UA: 1.025 (ref 1.005–1.030)
Urobilinogen, Ur: 0.2 mg/dL (ref 0.2–1.0)
pH, UA: 6 (ref 5.0–7.5)

## 2022-08-04 LAB — MICROSCOPIC EXAMINATION: WBC, UA: >30 /hpf — AB (ref 0–5)

## 2022-08-04 NOTE — Progress Notes (Addendum)
Kevin Melton,acting as a scribe for Kevin Altes, MD.,have documented all relevant documentation on the behalf of Kevin Altes, MD,as directed by  Kevin Altes, MD while in the presence of Kevin Altes, MD.  08/04/2022 2:02 PM   Kevin Melton [birth year] [id number]  Referring provider: Erasmo Downer, MD [address] [place],  [place] [address]  Chief Complaint  Patient presents with   Nephrolithiasis    HPI: Kevin Melton is a 72 y.o. male who presents today for follow up.  Followed for acontractile bladder, managed with CIC and nephrolithiasis Underwent right ureteroscopy with laser lithotripsy of a right proximal ureteral calculus and a left ureteroscopy for left hydronephrosis with findings of some narrowing of the distal ureter, but no stricture, intraluminal lesion, or stone. A left ureteral stone was placed and was subsequently removed on 05/18/2022 A follow up renal ultrasound on 07/29/2022 showed moderate hydronephrosis and dilation of the left ureter His only complaint has been postherpetic neuralgia in the right flank region. Otherwise, he is doing well. No problem with intermittent catheterization No left sided symptoms   PMH: Past Medical History:  Diagnosis Date   Acontractile bladder 06/17/2021   a.) secondary to BPH/detrusor hypotonicity; b.) requires self CIC 4x/day   Aortic atherosclerosis (HCC)    BPH (benign prostatic hyperplasia)    Bradycardia    Chronic kidney disease, stage III (moderate) (HCC)    Coronary artery disease    a. 2013 Inf MI/PCI: BMS to RCA; b. 11/2013 MV: small reversible apical anteroseptal defect/ischemia. EF 60%; c. 11/2020 MV: small, fixed basal inf defect - scar vs artifact. No ischemia. EF 45-50% (60-65% by echo).   Diastolic dysfunction    a.) TTE 11/19/2020: EF 60-65%, mild LVH, mild MR/AR, G1DD   Dyshidrotic eczema    GERD (gastroesophageal reflux disease)    Gout    Hepatic cyst     History of umbilical hernia repair    Hyperlipidemia    Hypertension    Inferior MI (HCC) 04/2011   a.) MI 04/2011 --> treated in [place] --> BMS (unknown type) to RCA   Kidney stones    Prediabetes    RBBB    Sigmoid diverticulosis    Skin cancer     Surgical History: Past Surgical History:  Procedure Laterality Date   COLONOSCOPY  2013   CORONARY ANGIOPLASTY WITH STENT PLACEMENT  2013   [place]   CYSTOSCOPY/URETEROSCOPY/HOLMIUM LASER/STENT PLACEMENT Right 03/29/2022   Procedure: CYSTOSCOPY/URETEROSCOPY/HOLMIUM LASER/STENT PLACEMENT/BILATERAL RETROGRADE [id number];  Surgeon: Kevin Altes, MD;  Location: [facility]S;  Service: Urology;  Laterality: Right;   ESOPHAGOGASTRODUODENOSCOPY N/A 12/08/2021   Procedure: ESOPHAGOGASTRODUODENOSCOPY (EGD);  Surgeon: Toledo, Boykin Nearing, MD;  Location: [facility] ENDOSCOPY;  Service: Gastroenterology;  Laterality: N/A;   ESOPHAGOGASTRODUODENOSCOPY (EGD) WITH PROPOFOL N/A 01/05/2017   Procedure: ESOPHAGOGASTRODUODENOSCOPY (EGD) WITH PROPOFOL With Dilation;  Surgeon: Toney Reil, MD;  Location: [facility] ENDOS[facility];  Service: Gastroenterology;  Laterality: N/A;   ESOPHAGOGASTRODUODENOSCOPY (EGD) WITH PROPOFOL N/A 07/03/2019   Procedure: ESOPHAGOGASTRODUODENOSCOPY (EGD) WITH PROPOFOL;  Surgeon: Toney Reil, MD;  Location: [facility] ENDOSCOPY;  Service: Gastroenterology;  Laterality: N/A;   ESOPHAGOGASTRODUODENOSCOPY (EGD) WITH PROPOFOL N/A 11/04/2019   Procedure: ESOPHAGOGASTRODUODENOSCOPY (EGD) WITH PROPOFOL;  Surgeon: Toney Reil, MD;  Location: [facility] ENDOSCOPY;  Service: Gastroenterology;  Laterality: N/A;   HERNIA REPAIR     LITHOTRIPSY  2014   UMBILICAL HERNIA REPAIR N/A 02/20/2017   Primary repair 2 cm umbilical hernia;  Surgeon:  Earline Mayotte, MD;  Location: [facility];  Service: General;  Laterality: N/A;   URETEROSCOPY Left 03/29/2022   Procedure: DIAGNOSTIC URETEROSCOPY/LEFT STENT PLACEMENT;  Surgeon: Kevin Altes, MD;   Location: [facility];  Service: Urology;  Laterality: Left;    Home Medications:  Allergies as of 08/04/2022   No Known Allergies      Medication List        Accurate as of Aug 04, 2022  2:02 PM. If you have any questions, ask your nurse or doctor.          allopurinol 300 MG tablet Commonly known as: ZYLOPRIM TAKE 1 TABLET BY MOUTH EVERYDAY AT BEDTIME   amitriptyline 25 MG tablet Commonly known as: ELAVIL Take 1/2 to 1 tablet twice a day   amLODipine 10 MG tablet Commonly known as: NORVASC Take 1 tablet (10 mg total) by mouth daily. Please schedule office visit before any future refill   aspirin 81 MG chewable tablet Chew 81 mg by mouth at bedtime.   atorvastatin 40 MG tablet Commonly known as: LIPITOR TAKE 1 TABLET BY MOUTH EVERY DAY   benazepril 40 MG tablet Commonly known as: LOTENSIN TAKE 1 TABLET BY MOUTH EVERY DAY   finasteride 5 MG tablet Commonly known as: PROSCAR Take 1 tablet (5 mg total) by mouth daily.   gabapentin 300 MG capsule Commonly known as: NEURONTIN Take 1 capsule (300 mg total) by mouth 3 (three) times daily.   nitroGLYCERIN 0.4 MG SL tablet Commonly known as: NITROSTAT Place 1 tablet (0.4 mg total) under the tongue every 5 (five) minutes as needed for chest pain.   oxybutynin 5 MG tablet Commonly known as: DITROPAN Take 1 tablet (5 mg total) by mouth every 8 (eight) hours as needed for bladder spasms.        Family History: Family History  Problem Relation Age of Onset   Dementia Mother 52   Lung disease Father 52       collapsed lung   Healthy Sister    Healthy Brother    Heart Problems Brother    Healthy Son    Healthy Sister    Colon cancer Neg Hx    Prostate cancer Neg Hx    Heart disease Neg Hx     Social History:  reports that he has never smoked. He has never used smokeless tobacco. He reports that he does not drink alcohol and does not use drugs.   Physical Exam: BP 138/69   Pulse 66   Ht 5\' 9"  (1.753 m)    Wt 195 lb (88.5 kg)   BMI 28.80 kg/m   Constitutional:  Alert and oriented, No acute distress. HEENT: [place] AT Respiratory: Normal respiratory effort, no increased work of breathing. Psychiatric: Normal mood and affect.  Assessment & Plan:    1. Left hydronephrosis Recent ureteroscopy showed no stricture but slight narrowing of the distal ureter. Schedule Lasix renogram  2.  Hypotonic bladder He is catheterizing 4 times daily He is unable to pass straight catheters and requires coud catheter for successful bladder emptying  I have reviewed the above documentation for accuracy and completeness, and I agree with the above.   Kevin Altes, MD  [facility] [address] [place], [place] [address] [phone]

## 2022-08-05 NOTE — Telephone Encounter (Signed)
Spoke with Cathlean Cower @ [facility] and advised we are working on the addendum to the last OV 08/04/22 with Dr. Lonna Cobb and will send ASAP when complete. Pt has also walked in and we advised the same.

## 2022-08-11 ENCOUNTER — Other Ambulatory Visit: Payer: Self-pay | Admitting: Urology

## 2022-08-11 ENCOUNTER — Ambulatory Visit
Admission: RE | Admit: 2022-08-11 | Discharge: 2022-08-11 | Disposition: A | Discharge status: Home / Self Care | Payer: PPO | Source: Ambulatory Visit | Attending: Urology | Admitting: Urology

## 2022-08-11 DIAGNOSIS — N1330 Unspecified hydronephrosis: Secondary | ICD-10-CM | POA: Diagnosis not present

## 2022-08-11 MED ORDER — FUROSEMIDE 10 MG/ML IJ SOLN
40.0000 mg | Freq: Once | INTRAMUSCULAR | Status: DC
  Filled 2022-08-11: qty 4

## 2022-08-11 MED ORDER — TECHNETIUM TC 99M MERTIATIDE
5.0800 | Freq: Once | INTRAVENOUS | Status: AC | PRN
  Administered 2022-08-11: 5.08 via INTRAVENOUS

## 2022-08-11 MED ORDER — FUROSEMIDE 10 MG/ML IJ SOLN
44.0000 mg | Freq: Once | INTRAMUSCULAR | Status: DC
  Filled 2022-08-11 (×2): qty 4.4

## 2022-08-12 ENCOUNTER — Encounter: Payer: Self-pay | Admitting: Urology

## 2022-08-12 ENCOUNTER — Other Ambulatory Visit: Payer: Self-pay | Admitting: Family Medicine

## 2022-08-15 DIAGNOSIS — R339 Retention of urine, unspecified: Secondary | ICD-10-CM | POA: Diagnosis not present

## 2022-08-19 ENCOUNTER — Encounter: Payer: Self-pay | Admitting: *Deleted

## 2022-09-01 ENCOUNTER — Ambulatory Visit (INDEPENDENT_AMBULATORY_CARE_PROVIDER_SITE_OTHER): Payer: PPO | Admitting: Family Medicine

## 2022-09-01 ENCOUNTER — Encounter: Payer: Self-pay | Admitting: Family Medicine

## 2022-09-01 VITALS — BP 123/68 | HR 54 | Temp 97.6°F | Resp 12 | Ht 69.0 in | Wt 202.2 lb

## 2022-09-01 DIAGNOSIS — B0229 Other postherpetic nervous system involvement: Secondary | ICD-10-CM

## 2022-09-01 DIAGNOSIS — N182 Chronic kidney disease, stage 2 (mild): Secondary | ICD-10-CM | POA: Diagnosis not present

## 2022-09-01 DIAGNOSIS — E782 Mixed hyperlipidemia: Secondary | ICD-10-CM | POA: Diagnosis not present

## 2022-09-01 DIAGNOSIS — E663 Overweight: Secondary | ICD-10-CM

## 2022-09-01 DIAGNOSIS — I10 Essential (primary) hypertension: Secondary | ICD-10-CM | POA: Diagnosis not present

## 2022-09-01 DIAGNOSIS — R7303 Prediabetes: Secondary | ICD-10-CM

## 2022-09-01 MED ORDER — GABAPENTIN 300 MG PO CAPS: ORAL_CAPSULE | ORAL | 3 refills | Status: DC

## 2022-09-01 MED ORDER — AMLODIPINE BESYLATE 10 MG PO TABS: 10.0000 mg | ORAL_TABLET | Freq: Every day | ORAL | 1 refills | Status: DC

## 2022-09-01 MED ORDER — ALLOPURINOL 300 MG PO TABS: ORAL_TABLET | ORAL | 1 refills | Status: DC

## 2022-09-01 NOTE — Assessment & Plan Note (Signed)
Chronic and stable Recheck metabolic panel Avoid nephrotoxic meds 

## 2022-09-01 NOTE — Assessment & Plan Note (Signed)
Discussed importance of healthy weight management Discussed diet and exercise  

## 2022-09-01 NOTE — Assessment & Plan Note (Signed)
Well controlled on current medication regimen. -Continue Amlodipine 10mg  daily and Benazepril 40mg  daily.

## 2022-09-01 NOTE — Assessment & Plan Note (Signed)
Recommend low carb diet °Recheck A1c  °

## 2022-09-01 NOTE — Progress Notes (Signed)
I,Kevin Melton,acting as a Neurosurgeon for Kevin Latch, MD.,have documented all relevant documentation on the behalf of Kevin Latch, MD,as directed by  Kevin Latch, MD while in the presence of Kevin Latch, MD.     Established patient visit   Patient: Kevin Melton   DOB: 02/18/1951   72 y.o. Male  MRN: [id number] Visit Date: 09/01/2022  Today's healthcare provider: [name], MD   Chief Complaint  Patient presents with   Follow-up   Hypertension   Insect Bite   Subjective    HPI HPI   Shingles rash. Last edited by Myles Lipps, CMA on 09/01/2022  1:11 PM.      Follow up for shingles  The patient was last seen for this 3 months ago. Changes made at last visit include start gabapentin 300 mg TID.  He reports excellent compliance with treatment. He feels that condition is Improved. He is not having side effects.   ----------------------------------------------------------------------------------------- Hypertension, follow-up  BP Readings from Last 3 Encounters:  09/01/22 123/68  08/04/22 138/69  05/31/22 (!) 141/60   Wt Readings from Last 3 Encounters:  09/01/22 202 lb 3.2 oz (91.7 kg)  08/04/22 195 lb (88.5 kg)  05/31/22 199 lb 12.8 oz (90.6 kg)     He was last seen for hypertension 6 months ago.  BP at that visit was 124/66. Management since that visit includes no changes.  He reports excellent compliance with treatment. He is not having side effects.   Outside blood pressures are stable 130/80s. Symptoms: No chest pain No chest pressure  No palpitations No syncope  No dyspnea No orthopnea  No paroxysmal nocturnal dyspnea No lower extremity edema   Pertinent labs Lab Results  Component Value Date   CHOL 121 11/03/2021   HDL 37 (L) 11/03/2021   LDLCALC 61 11/03/2021   TRIG 115 11/03/2021   CHOLHDL 3.3 11/03/2021   Lab Results  Component Value Date   NA 141 02/28/2022   K 4.2 02/28/2022    CREATININE 1.31 (H) 02/28/2022   GFRNONAA 59 (L) 02/28/2022   GLUCOSE 133 (H) 02/28/2022   TSH 2.518 11/03/2021     The ASCVD Risk score (Arnett DK, et al., 2019) failed to calculate for the following reasons:   The patient has a prior MI or stroke diagnosis  --------------------------------------------------------------------------------------------------- Patient reports pulling off a tick from his right leg about a week ago. He denies any fever or chills.   Discussed the use of AI scribe software for clinical note transcription with the patient, who gave verbal consent to proceed.  History of Present Illness   The patient, with a history of shingles, presents with persistent post-herpetic neuralgia that has been ongoing for about three months. The patient describes the pain as a stabbing sensation that is particularly noticeable at night and in the morning. The pain is not debilitating but is described as annoying and disruptive to his daily activities. The patient also experiences back pain after being active, which requires them to take breaks to alleviate the discomfort. The patient has been on gabapentin for about a month and a half, which he believes may be helping with the pain, but he is not certain.  In addition to the post-herpetic neuralgia, the patient also reports a recent tick bite, which occurred about a week ago. The patient removed the tick within an hour of noticing it, but there is still a red, inflamed area at the site of the bite. The patient has been  applying antibiotic ointment to the area, but it remains red and slightly itchy.        Medications: Outpatient Medications Prior to Visit  Medication Sig   aspirin 81 MG chewable tablet Chew 81 mg by mouth at bedtime.    atorvastatin (LIPITOR) 40 MG tablet TAKE 1 TABLET BY MOUTH EVERY DAY   benazepril (LOTENSIN) 40 MG tablet TAKE 1 TABLET BY MOUTH EVERY DAY   finasteride (PROSCAR) 5 MG tablet TAKE 1 TABLET (5 MG TOTAL)  BY MOUTH DAILY.   nitroGLYCERIN (NITROSTAT) 0.4 MG SL tablet Place 1 tablet (0.4 mg total) under the tongue every 5 (five) minutes as needed for chest pain.   oxybutynin (DITROPAN) 5 MG tablet Take 1 tablet (5 mg total) by mouth every 8 (eight) hours as needed for bladder spasms.   [DISCONTINUED] allopurinol (ZYLOPRIM) 300 MG tablet TAKE 1 TABLET BY MOUTH EVERYDAY AT BEDTIME   [DISCONTINUED] amitriptyline (ELAVIL) 25 MG tablet Take 1/2 to 1 tablet twice a day   [DISCONTINUED] amLODipine (NORVASC) 10 MG tablet Take 1 tablet (10 mg total) by mouth daily. Please schedule office visit before any future refill   [DISCONTINUED] gabapentin (NEURONTIN) 300 MG capsule Take 1 capsule (300 mg total) by mouth 3 (three) times daily.   No facility-administered medications prior to visit.    Review of Systems per HPI     Objective    BP 123/68 (BP Location: Left Arm, Patient Position: Sitting, Cuff Size: Large)   Pulse (!) 54   Temp 97.6 F (36.4 C) (Temporal)   Resp 12   Ht 5\' 9"  (1.753 m)   Wt 202 lb 3.2 oz (91.7 kg)   SpO2 99%   BMI 29.86 kg/m  BP Readings from Last 3 Encounters:  09/01/22 123/68  08/04/22 138/69  05/31/22 (!) 141/60   Wt Readings from Last 3 Encounters:  09/01/22 202 lb 3.2 oz (91.7 kg)  08/04/22 195 lb (88.5 kg)  05/31/22 199 lb 12.8 oz (90.6 kg)      Physical Exam Vitals reviewed.  Constitutional:      General: He is not in acute distress.    Appearance: Normal appearance. He is not diaphoretic.  HENT:     Head: Normocephalic and atraumatic.  Eyes:     General: No scleral icterus.    Conjunctiva/sclera: Conjunctivae normal.  Cardiovascular:     Rate and Rhythm: Normal rate and regular rhythm.     Pulses: Normal pulses.     Heart sounds: Normal heart sounds. No murmur heard. Pulmonary:     Effort: Pulmonary effort is normal. No respiratory distress.     Breath sounds: Normal breath sounds. No wheezing or rhonchi.  Musculoskeletal:     Cervical back: Neck  supple.     Right lower leg: No edema.     Left lower leg: No edema.  Lymphadenopathy:     Cervical: No cervical adenopathy.  Skin:    General: Skin is warm and dry.  Neurological:     Mental Status: He is alert and oriented to person, place, and time. Mental status is at baseline.  Psychiatric:        Mood and Affect: Mood normal.        Behavior: Behavior normal.     Physical Exam   VITALS: BP- 123/69 SKIN: Hyperpigmented macules over T11 to T12 distribution on the right.       No results found for any visits on 09/01/22.  Assessment & Plan  Problem List Items Addressed This Visit       Cardiovascular and Mediastinum   Essential hypertension with goal blood pressure less than 130/80    Well controlled on current medication regimen. -Continue Amlodipine 10mg  daily and Benazepril 40mg  daily.      Relevant Medications   amLODipine (NORVASC) 10 MG tablet   Other Relevant Orders   Comprehensive metabolic panel   CBC w/Diff/Platelet     Nervous and Auditory   Post herpetic neuralgia - Primary    Postherpetic Neuralgia: Persistent pain and sensitivity in the area of previous shingles rash, with intermittent stabbing pain and muscle tightness. Gabapentin appears to provide some relief. -Increase Gabapentin to 300mg  in the morning and midday, and 600mg  at night. -Continue to monitor and manage symptoms.  Shingles Rash: Residual rash from previous shingles outbreak, no longer blistering but still present and sensitive. -Continue to monitor and expect gradual improvement.      Relevant Orders   CBC w/Diff/Platelet     Genitourinary   CKD (chronic kidney disease) stage 2, GFR 60-89 ml/min    Chronic and stable Recheck metabolic panel Avoid nephrotoxic meds       Relevant Orders   Comprehensive metabolic panel     Other   Mixed hyperlipidemia    On Atorvastatin for cholesterol management. -Continue Atorvastatin 40mg  daily.      Relevant Medications    amLODipine (NORVASC) 10 MG tablet   Other Relevant Orders   Lipid panel   Comprehensive metabolic panel   Prediabetes    Recommend low carb diet Recheck A1c      Relevant Orders   Hemoglobin A1c   Overweight    Discussed importance of healthy weight management Discussed diet and exercise       Relevant Orders   Lipid panel   Comprehensive metabolic panel   Hemoglobin A1c   CBC w/Diff/Platelet       Tick Bite: Recent tick bite with local reaction, no signs of infection or systemic symptoms. -Observe and allow for natural resolution.  General Health Maintenance: -Order routine blood work including cholesterol, kidney and liver function, A1C, and blood counts. -Schedule follow-up visit in 3-6 months.        Return in about 6 months (around 03/03/2023) for CPE.      I, Kevin Latch, MD, have reviewed all documentation for this visit. The documentation on 09/01/22 for the exam, diagnosis, procedures, and orders are all accurate and complete.   Vega Withrow, Marzella Schlein, MD, MPH [facility]

## 2022-09-01 NOTE — Assessment & Plan Note (Signed)
On Atorvastatin for cholesterol management. -Continue Atorvastatin 40mg  daily.

## 2022-09-01 NOTE — Assessment & Plan Note (Signed)
Postherpetic Neuralgia: Persistent pain and sensitivity in the area of previous shingles rash, with intermittent stabbing pain and muscle tightness. Gabapentin appears to provide some relief. -Increase Gabapentin to 300mg  in the morning and midday, and 600mg  at night. -Continue to monitor and manage symptoms.  Shingles Rash: Residual rash from previous shingles outbreak, no longer blistering but still present and sensitive. -Continue to monitor and expect gradual improvement.

## 2022-09-02 LAB — COMPREHENSIVE METABOLIC PANEL
ALT: 13 IU/L (ref 0–44)
AST: 14 IU/L (ref 0–40)
Albumin: 4.1 g/dL (ref 3.9–4.9)
Alkaline Phosphatase: 89 IU/L (ref 44–121)
BUN/Creatinine Ratio: 12 (ref 10–24)
BUN: 16 mg/dL (ref 8–27)
Bilirubin Total: 0.4 mg/dL (ref 0.0–1.2)
CO2: 24 mmol/L (ref 20–29)
Calcium: 9.7 mg/dL (ref 8.6–10.2)
Chloride: 106 mmol/L (ref 96–106)
Creatinine, Ser: 1.34 mg/dL — ABNORMAL HIGH (ref 0.76–1.27)
Globulin, Total: 3.2 g/dL (ref 1.5–4.5)
Glucose: 101 mg/dL — ABNORMAL HIGH (ref 70–99)
Potassium: 5.8 mmol/L — ABNORMAL HIGH (ref 3.5–5.2)
Sodium: 142 mmol/L (ref 134–144)
Total Protein: 7.3 g/dL (ref 6.0–8.5)
eGFR: 57 mL/min/{1.73_m2} — ABNORMAL LOW (ref >59)

## 2022-09-02 LAB — HEMOGLOBIN A1C
Est. average glucose Bld gHb Est-mCnc: 148 mg/dL
Hgb A1c MFr Bld: 6.8 % — ABNORMAL HIGH (ref 4.8–5.6)

## 2022-09-02 LAB — CBC WITH DIFFERENTIAL/PLATELET
Basophils Absolute: 0.1 10*3/uL (ref 0.0–0.2)
Basos: 1 %
EOS (ABSOLUTE): 0.5 10*3/uL — ABNORMAL HIGH (ref 0.0–0.4)
Eos: 6 %
Hematocrit: 41.7 % (ref 37.5–51.0)
Hemoglobin: 13.7 g/dL (ref 13.0–17.7)
Immature Grans (Abs): 0 10*3/uL (ref 0.0–0.1)
Immature Granulocytes: 0 %
Lymphocytes Absolute: 2.7 10*3/uL (ref 0.7–3.1)
Lymphs: 30 %
MCH: 29.7 pg (ref 26.6–33.0)
MCHC: 32.9 g/dL (ref 31.5–35.7)
MCV: 91 fL (ref 79–97)
Monocytes Absolute: 0.9 10*3/uL (ref 0.1–0.9)
Monocytes: 11 %
Neutrophils Absolute: 4.6 10*3/uL (ref 1.4–7.0)
Neutrophils: 52 %
Platelets: 285 10*3/uL (ref 150–450)
RBC: 4.61 x10E6/uL (ref 4.14–5.80)
RDW: 13.5 % (ref 11.6–15.4)
WBC: 8.8 10*3/uL (ref 3.4–10.8)

## 2022-09-02 LAB — LIPID PANEL
Chol/HDL Ratio: 3.7 ratio (ref 0.0–5.0)
Cholesterol, Total: 134 mg/dL (ref 100–199)
HDL: 36 mg/dL — ABNORMAL LOW (ref >39)
LDL Chol Calc (NIH): 68 mg/dL (ref 0–99)
Triglycerides: 179 mg/dL — ABNORMAL HIGH (ref 0–149)
VLDL Cholesterol Cal: 30 mg/dL (ref 5–40)

## 2022-09-02 NOTE — Addendum Note (Signed)
Addended by: Erasmo Downer on: 09/02/2022 01:24 PM   Modules accepted: Level of Service

## 2022-09-21 ENCOUNTER — Ambulatory Visit: Payer: PPO | Admitting: Urology

## 2022-09-21 ENCOUNTER — Encounter: Payer: Self-pay | Admitting: Urology

## 2022-09-21 VITALS — BP 126/70 | HR 61 | Ht 69.0 in | Wt 195.0 lb

## 2022-09-21 DIAGNOSIS — N1330 Unspecified hydronephrosis: Secondary | ICD-10-CM | POA: Diagnosis not present

## 2022-09-21 NOTE — Progress Notes (Signed)
I, Kevin Melton, acting as a Neurosurgeon for Riki Altes, MD., have documented all relevant documentation on the behalf of Riki Altes, MD, as directed by  Riki Altes, MD while in the presence of Riki Altes, MD.   09/21/2022 1:45 PM   Kevin Melton [birth year] [id number]  Referring provider: Erasmo Downer, MD [address] [place],  [place] [address]  Chief Complaint  Patient presents with   Results    HPI: Kevin Melton is a 72 y.o. male who presents today for follow up.  Refer to my prior note 08/04/22. Lasix renogram performed 08/11/22 showed differential function of 64.6% right kidney and 35.4% left kidney. There was delayed clearance of pharmaceutical from a dilated left renal collecting system with columning of the radiopharmaceutical along the length of the ureter to the region of the UVJ. He states he had an impacted stone on the left side approximately 20 years ago and underwent an evaluation similar to his previous renal scan and reports the findings were similar. He presently denies flank or abdominal pain.   PMH: Past Medical History:  Diagnosis Date   Acontractile bladder 06/17/2021   a.) secondary to BPH/detrusor hypotonicity; b.) requires self CIC 4x/day   Aortic atherosclerosis (HCC)    BPH (benign prostatic hyperplasia)    Bradycardia    Chronic kidney disease, stage III (moderate) (HCC)    Coronary artery disease    a. 2013 Inf MI/PCI: BMS to RCA; b. 11/2013 MV: small reversible apical anteroseptal defect/ischemia. EF 60%; c. 11/2020 MV: small, fixed basal inf defect - scar vs artifact. No ischemia. EF 45-50% (60-65% by echo).   Diastolic dysfunction    a.) TTE 11/19/2020: EF 60-65%, mild LVH, mild MR/AR, G1DD   Dyshidrotic eczema    GERD (gastroesophageal reflux disease)    Gout    Hepatic cyst    History of umbilical hernia repair    Hyperlipidemia    Hypertension    Inferior MI (HCC) 04/2011   a.) MI 04/2011  --> treated in [place] --> BMS (unknown type) to RCA   Kidney stones    Prediabetes    RBBB    Sigmoid diverticulosis    Skin cancer     Surgical History: Past Surgical History:  Procedure Laterality Date   COLONOSCOPY  2013   CORONARY ANGIOPLASTY WITH STENT PLACEMENT  2013   [place]   CYSTOSCOPY/URETEROSCOPY/HOLMIUM LASER/STENT PLACEMENT Right 03/29/2022   Procedure: CYSTOSCOPY/URETEROSCOPY/HOLMIUM LASER/STENT PLACEMENT/BILATERAL RETROGRADE [id number];  Surgeon: Riki Altes, MD;  Location: [facility] OR[facility];  Service: Urology;  Laterality: Right;   ESOPHAGOGASTRODUODENOSCOPY N/A 12/08/2021   Procedure: ESOPHAGOGASTRODUODENOSCOPY (EGD);  Surgeon: Toledo, Boykin Nearing, MD;  Location: [facility] ENDOSCOPY;  Service: Gastroenterology;  Laterality: N/A;   ESOPHAGOGASTRODUODENOSCOPY (EGD) WITH PROPOFOL N/A 01/05/2017   Procedure: ESOPHAGOGASTRODUODENOSCOPY (EGD) WITH PROPOFOL With Dilation;  Surgeon: Toney Reil, MD;  Location: [facility] ENDOSCOPY;  Service: Gastroenterology;  Laterality: N/A;   ESOPHAGOGASTRODUODENOSCOPY (EGD) WITH PROPOFOL N/A 07/03/2019   Procedure: ESOPHAGOGASTRODUODENOSCOPY (EGD) WITH PROPOFOL;  Surgeon: Toney Reil, MD;  Location[facility] END[facility]COPY;  Service: Gastroenterology;  Laterality: N/A;   ESOPHAGOGASTRODUODENOSCOPY (EGD) WITH PROPOFOL N/A 11/04/2019   Procedure: ESOPHAGOGASTRODUODENOSCOPY (EGD) WITH PROPOFOL;  Surgeon: Toney Reil, MD;  Location: [facility] ENDOSCOPY;  Service: Gastroenterology;  Laterality: N/A;   HERNIA REPAIR     LITHOTRIPSY  2014   UMBILICAL HERNIA REPAIR N/A 02/20/2017   Primary repair 2 cm umbilical hernia;  Surgeon: Earline Mayotte, MD;  Location: [facility];  Service: General;  Laterality: N/A;   URETEROSCOPY Left 03/29/2022   Procedure: DIAGNOSTIC URETEROSCOPY/LEFT STENT PLACEMENT;  Surgeon: Riki Altes, MD;  Location: [facility];  Service: Urology;  Laterality: Left;    Home Medications:  Allergies as of 09/21/2022   No Known  Allergies      Medication List        Accurate as of September 21, 2022  1:45 PM. If you have any questions, ask your nurse or doctor.          allopurinol 300 MG tablet Commonly known as: ZYLOPRIM TAKE 1 TABLET BY MOUTH EVERYDAY AT BEDTIME   amLODipine 10 MG tablet Commonly known as: NORVASC Take 1 tablet (10 mg total) by mouth daily.   aspirin 81 MG chewable tablet Chew 81 mg by mouth at bedtime.   atorvastatin 40 MG tablet Commonly known as: LIPITOR TAKE 1 TABLET BY MOUTH EVERY DAY   benazepril 40 MG tablet Commonly known as: LOTENSIN TAKE 1 TABLET BY MOUTH EVERY DAY   finasteride 5 MG tablet Commonly known as: PROSCAR TAKE 1 TABLET (5 MG TOTAL) BY MOUTH DAILY.   gabapentin 300 MG capsule Commonly known as: NEURONTIN Take 1 capsule (300 mg total) by mouth 2 (two) times daily AND 2 capsules (600 mg total) at bedtime.   nitroGLYCERIN 0.4 MG SL tablet Commonly known as: NITROSTAT Place 1 tablet (0.4 mg total) under the tongue every 5 (five) minutes as needed for chest pain.   oxybutynin 5 MG tablet Commonly known as: DITROPAN Take 1 tablet (5 mg total) by mouth every 8 (eight) hours as needed for bladder spasms.        Family History: Family History  Problem Relation Age of Onset   Dementia Mother 48   Lung disease Father 53       collapsed lung   Healthy Sister    Healthy Brother    Heart Problems Brother    Healthy Son    Healthy Sister    Colon cancer Neg Hx    Prostate cancer Neg Hx    Heart disease Neg Hx     Social History:  reports that he has never smoked. He has never used smokeless tobacco. He reports that he does not drink alcohol and does not use drugs.   Physical Exam: BP 126/70   Pulse 61   Ht 5\' 9"  (1.753 m)   Wt 195 lb (88.5 kg)   BMI 28.80 kg/m   Constitutional:  Alert and oriented, No acute distress. HEENT: [place] AT Respiratory: Normal respiratory effort, no increased work of breathing. Psychiatric: Normal mood and  affect.  Assessment & Plan:    1. Left hydronephrosis Previous ureteroscopy showed narrowing at the left UVJ without a definite stricture. He was briefly stented, but had recurrent hydronephrosis on follow up imaging. Lasix renogram felt to show mild obstruction at the left UVJ; renal function is diminished, which according to the patient is long standing. We discussed balloon dilation and stent placement, ureteral reimplantation and observation. He has elected observation and will follow up in 6 months with a serum creatinine.  I have reviewed the above documentation for accuracy and completeness, and I agree with the above.   Riki Altes, MD  [facility] [address] [place], [place] [address] [phone]

## 2022-09-22 ENCOUNTER — Encounter: Payer: Self-pay | Admitting: Urology

## 2022-11-09 DIAGNOSIS — R339 Retention of urine, unspecified: Secondary | ICD-10-CM | POA: Diagnosis not present

## 2022-11-29 ENCOUNTER — Telehealth: Payer: Self-pay | Admitting: Pharmacist

## 2022-11-29 NOTE — Progress Notes (Addendum)
11/29/2022  Patient ID: Kevin Melton, male   DOB: 02-Oct-1950, 72 y.o.   MRN: [id number]  Pharmacy Quality Measure Review  This patient is appearing on a report for being at risk of failing the adherence measure for cholesterol (statin) and hypertension (ACEi/ARB) medications this calendar year.   Medication: Benazepril 40mg  Last fill date: 09/09/22 for 90 day supply  Medication: Atorvastatin 40mg  Last fill date: 09/09/22 for 90 day supply  Insurance report was not up to date. No action needed at this time.     Marlowe Aschoff, PharmD [facility] Phone Number: [phone]

## 2022-12-03 ENCOUNTER — Other Ambulatory Visit: Payer: Self-pay | Admitting: Family Medicine

## 2022-12-05 NOTE — Telephone Encounter (Signed)
Requested Prescriptions  Pending Prescriptions Disp Refills   atorvastatin (LIPITOR) 40 MG tablet [Pharmacy Med Name: ATORVASTATIN 40 MG TABLET] 90 tablet 1    Sig: TAKE 1 TABLET BY MOUTH EVERY DAY     Cardiovascular:  Antilipid - Statins Failed - 12/03/2022  8:45 AM      Failed - Lipid Panel in normal range within the last 12 months    Cholesterol, Total  Date Value Ref Range Status  09/01/2022 134 100 - 199 mg/dL Final   LDL Cholesterol (Calc)  Date Value Ref Range Status  11/10/2016 90 mg/dL (calc) Final    Comment:    Reference range: <100 . Desirable range <100 mg/dL for primary prevention;   <70 mg/dL for patients with CHD or diabetic patients  with > or = 2 CHD risk factors. Marland Kitchen LDL-C is now calculated using the Martin-Hopkins  calculation, which is a validated novel method providing  better accuracy than the Friedewald equation in the  estimation of LDL-C.  Horald Pollen et al. Lenox Ahr. 1610;960(45): 2061-2068  (http://education.[facility].com/faq/FAQ164)    LDL Chol Calc (NIH)  Date Value Ref Range Status  09/01/2022 68 0 - 99 mg/dL Final   HDL  Date Value Ref Range Status  09/01/2022 36 (L) >39 mg/dL Final   Triglycerides  Date Value Ref Range Status  09/01/2022 179 (H) 0 - 149 mg/dL Final         Passed - Patient is not pregnant      Passed - Valid encounter within last 12 months    Recent Outpatient Visits           3 months ago Post herpetic neuralgia   [place] [facility] [place], Marzella Schlein, MD   6 months ago Herpes zoster without complication   [place] [facility] Malva Limes, MD   1 year ago Essential hypertension with goal blood pressure less than 130/80   [facility] [place], Darl Householder, DO   1 year ago Annual physical exam   [place] [facility] [place], Marzella Schlein, MD   1 year ago Essential hypertension with goal blood pressure less than 130/80   [facility] [place], Marzella Schlein, MD       Future Appointments             In 3 days Bacigalupo, Marzella Schlein, MD [facility]   In 3 months McG[place]an, Elana Alm [facility] [place]   In 5 months Bacigalupo, Marzella Schlein, MD [facility]             benazepril (LOTENSIN) 40 MG tablet [Pharmacy Med Name: BENAZEPRIL HCL 40 MG TABLET] 90 tablet 1    Sig: TAKE 1 TABLET BY MOUTH EVERY DAY     Cardiovascular:  ACE Inhibitors Failed - 12/03/2022  8:45 AM      Failed - Cr in normal range and within 180 days    Creat  Date Value Ref Range Status  12/13/2016 1.36 (H) 0.70 - 1.25 mg/dL Final    Comment:    For patients >70 years of age, the reference limit for Creatinine is approximately 13% higher for people identified as African-American. .    Creatinine, Ser  Date Value Ref Range Status  09/01/2022 1.34 (H) 0.76 - 1.27 mg/dL Final         Failed - K in normal range and within 180 days  Potassium  Date Value Ref Range Status  09/01/2022 5.8 (H) 3.5 - 5.2 mmol/L Final         Passed - Patient is not pregnant      Passed - Last BP in normal range    BP Readings from Last 1 Encounters:  09/21/22 126/70         Passed - Valid encounter within last 6 months    Recent Outpatient Visits           3 months ago Post herpetic neuralgia   [place] [facility] [place], Marzella Schlein, MD   6 months ago Herpes zoster without complication   [place] [facility] Malva Limes, MD   1 year ago Essential hypertension with goal blood pressure less than 130/80   [facility] Caro Laroche, DO   1 year ago Annual physical exam   [place] [facility] [place], Marzella Schlein, MD   1 year ago Essential hypertension with goal blood pressure less than 130/80   [place] [facility] [place], Marzella Schlein, MD       Future Appointments             In 3 days Bacigalupo, Marzella Schlein, MD [facility], [facility]   In 3 months McG[place]an, Elana Alm [facility] [place]   In 5 months Bacigalupo, Marzella Schlein, MD [facility]

## 2022-12-08 ENCOUNTER — Ambulatory Visit: Payer: PPO | Admitting: Family Medicine

## 2022-12-08 ENCOUNTER — Encounter: Payer: Self-pay | Admitting: Family Medicine

## 2022-12-08 ENCOUNTER — Ambulatory Visit: Payer: PPO | Attending: Nurse Practitioner | Admitting: Nurse Practitioner

## 2022-12-08 ENCOUNTER — Encounter: Payer: Self-pay | Admitting: Nurse Practitioner

## 2022-12-08 VITALS — BP 113/61 | HR 49 | Temp 98.1°F | Ht 69.0 in | Wt 191.5 lb

## 2022-12-08 VITALS — BP 127/66 | HR 54 | Ht 69.0 in | Wt 192.8 lb

## 2022-12-08 DIAGNOSIS — E875 Hyperkalemia: Secondary | ICD-10-CM

## 2022-12-08 DIAGNOSIS — I10 Essential (primary) hypertension: Secondary | ICD-10-CM | POA: Diagnosis not present

## 2022-12-08 DIAGNOSIS — Z23 Encounter for immunization: Secondary | ICD-10-CM

## 2022-12-08 DIAGNOSIS — N1831 Chronic kidney disease, stage 3a: Secondary | ICD-10-CM

## 2022-12-08 DIAGNOSIS — R7303 Prediabetes: Secondary | ICD-10-CM

## 2022-12-08 DIAGNOSIS — B0229 Other postherpetic nervous system involvement: Secondary | ICD-10-CM | POA: Diagnosis not present

## 2022-12-08 DIAGNOSIS — M1A09X0 Idiopathic chronic gout, multiple sites, without tophus (tophi): Secondary | ICD-10-CM | POA: Diagnosis not present

## 2022-12-08 DIAGNOSIS — E785 Hyperlipidemia, unspecified: Secondary | ICD-10-CM

## 2022-12-08 DIAGNOSIS — I2510 Atherosclerotic heart disease of native coronary artery without angina pectoris: Secondary | ICD-10-CM | POA: Diagnosis not present

## 2022-12-08 DIAGNOSIS — E782 Mixed hyperlipidemia: Secondary | ICD-10-CM | POA: Diagnosis not present

## 2022-12-08 MED ORDER — ATORVASTATIN CALCIUM 40 MG PO TABS: 40.0000 mg | ORAL_TABLET | Freq: Every day | ORAL | 3 refills | Status: DC

## 2022-12-08 MED ORDER — ASPIRIN 81 MG PO CHEW: 81.0000 mg | CHEWABLE_TABLET | Freq: Every day | ORAL | 3 refills | Status: DC

## 2022-12-08 MED ORDER — AMLODIPINE BESYLATE 10 MG PO TABS: 10.0000 mg | ORAL_TABLET | Freq: Every day | ORAL | 3 refills | Status: DC

## 2022-12-08 NOTE — Patient Instructions (Addendum)
Medication Instructions:  Your physician recommends that you continue on your current medications as directed. Please refer to the Current Medication list given to you today.  *If you need a refill on your cardiac medications before your next appointment, please call your pharmacy*  Lab Work: -None ordered  Testing/Procedures: -None ordered  Follow-Up: At [facility], you and your health needs are our priority.  As part of our continuing mission to provide you with exceptional heart care, we have created designated Provider Care Teams.  These Care Teams include your primary Cardiologist (physician) and Advanced Practice Providers (APPs -  Physician Assistants and Nurse Practitioners) who all work together to provide you with the care you need, when you need it.  Your next appointment:   1 year(s)  Provider:   You may see Yvonne Kendall, MD or one of the following Advanced Practice Providers on your designated Care Team:   Nicolasa Ducking, NP Eula Listen, PA-C Cadence Fransico Michael, PA-C Charlsie Quest, NP    Other Instructions -None

## 2022-12-08 NOTE — Assessment & Plan Note (Signed)
Well-controlled on atorvastatin 40mg  daily. -Continue atorvastatin 40mg  daily.

## 2022-12-08 NOTE — Assessment & Plan Note (Signed)
Chronic and stable Recheck metabolic panel Avoid nephrotoxic meds  

## 2022-12-08 NOTE — Assessment & Plan Note (Signed)
Persistent sensitivity and occasional pain in the area affected by previous shingles outbreak. Patient is currently managing symptoms with gabapentin. -Continue gabapentin as needed for symptom management.

## 2022-12-08 NOTE — Progress Notes (Signed)
Office Visit    Patient Name: Kevin Melton Date of Encounter: 12/08/2022  Primary Care Provider:  Erasmo Downer, MD Primary Cardiologist:  Yvonne Kendall, MD  Chief Complaint    72 y.o. male with a history of CAD, hypertension, hyperlipidemia, stage III chronic kidney disease, prediabetes, bradycardia, and right bundle branch block, presents for CAD follow-up.  Past Medical History    Past Medical History:  Diagnosis Date   Acontractile bladder 06/17/2021   a.) secondary to BPH/detrusor hypotonicity; b.) requires self CIC 4x/day   Aortic atherosclerosis (HCC)    BPH (benign prostatic hyperplasia)    Bradycardia    Chronic kidney disease, stage III (moderate) (HCC)    Coronary artery disease    a. 2013 Inf MI/PCI: BMS to RCA; b. 11/2013 MV: small reversible apical anteroseptal defect/ischemia. EF 60%; c. 11/2020 MV: small, fixed basal inf defect - scar vs artifact. No ischemia. EF 45-50% (60-65% by echo).   Diastolic dysfunction    a.) TTE 11/19/2020: EF 60-65%, mild LVH, mild MR/AR, G1DD   Dyshidrotic eczema    GERD (gastroesophageal reflux disease)    Gout    Hepatic cyst    History of umbilical hernia repair    Hyperlipidemia    Hypertension    Inferior MI (HCC) 04/2011   a.) MI 04/2011 --> treated in [place] --> BMS (unknown type) to RCA   Kidney stones    Prediabetes    RBBB    Sigmoid diverticulosis    Skin cancer    Past Surgical History:  Procedure Laterality Date   COLONOSCOPY  2013   CORONARY ANGIOPLASTY WITH STENT PLACEMENT  2013   [place]   CYSTOSCOPY/URETEROSCOPY/HOLMIUM LASER/STENT PLACEMENT Right 03/29/2022   Procedure: CYSTOSCOPY/URETEROSCOPY/HOLMIUM LASER/STENT PLACEMENT/BILATERAL RETROGRADE [id number];  Surgeon: Riki Altes, MD;  Location: [facility] ORS;  Service: Urology;  Laterality: Right;   ESOPHAGOGASTRODUODENOSCOPY N/A 12/08/2021   Procedure: ESOPHAGOGASTRODUODENOSCOPY (EGD);  Surgeon: Toledo, Boykin Nearing, MD;  Location:  [facility] ENDOSCOPY;  Service: Gastroenterology;  Laterality: N/A;   ESOPHAGOGASTRODUODENOSCOPY (EGD) WITH PROPOFOL N/A 01/05/2017   Procedure: ESOPHAGOGASTRODUODENOSCOPY (EGD) WITH PROPOFOL With Dilation;  Surgeon: Toney Reil, MD;  Location: [facility] ENDOSCOPY;  Service: Gastroenterology;  Laterality: N/A;   ESOPHAGOGASTRODUODENOSCOPY (EGD) WITH PROPOFOL N/A 07/03/2019   Procedure: ESOPHAGOGASTRODUODENOSCOPY (EGD) WITH PROPOFOL;  Surgeon: Toney Reil, MD;  [facility] ENDOSCOPY;  Service: Gastroenterology;  Laterality: N/A;   ESOPHAGOGASTRODUODENOSCOPY (EGD) WITH PROPOFOL N/A 11/04/2019   Procedure: ESOPHAGOGASTRODUODENOSCOPY (EGD) WITH PROPOFOL;  Surgeon: Toney Reil, MD;  Location[facility];  Service: Gastroenterology;  Laterality: N/A;   HERNIA REPAIR     LITHOTRIPSY  2014   UMBILICAL HERNIA REPAIR N/A 02/20/2017   Primary repair 2 cm umbilical hernia;  Surgeon: Earline Mayotte, MD;  Location: [facility] ORS;  Service: General;  Laterality: N/A;   URETEROSCOPY Left 03/29/2022   Procedure: DIAGNOSTIC URETEROSCOPY/LEFT STENT PLACEMENT;  Surgeon: Riki Altes, MD;  Location: [facility] ORS;  Service: Urology;  Laterality: Left;    Allergies  No Known Allergies  History of Present Illness      72 y.o. y/o male with a history of CAD, hypertension, hyperlipidemia, stage III chronic kidney disease, prediabetes, bradycardia, and right bundle branch block.  He previously suffered an inferior myocardial infarction in [place], [place] and February 2013, requiring bare-metal stenting of the right coronary artery.  Subsequent stress tests in September 2015 and again in September 2022, were low risk with the most recent showing a small, fixed basal inferior defect without  ischemia.  Most recent echo in September 2022 showed an EF of 60 to 65% with mild LVH, mild MR/AI, and grade 1 diastolic dysfunction.   [name] was last seen in cardiology clinic in August 2023, at which  time he was doing well.  He has since continued to do well.  HbA1c increased to 6.8 in 08/2022, and since then, he has cut out sweets and made other dietary changes that have resulted in a 10 pound weight loss.  His goal is to get his BMI under 25.  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  He stays very active throughout his day and is hopeful to start getting back to the gym.  He denies chest pain, palpitations, dyspnea, pnd, orthopnea, n, v, dizziness, syncope, edema, weight gain, or early satiety.   Home Medications    Current Outpatient Medications  Medication Sig Dispense Refill   allopurinol (ZYLOPRIM) 300 MG tablet TAKE 1 TABLET BY MOUTH EVERYDAY AT BEDTIME 90 tablet 1   amLODipine (NORVASC) 10 MG tablet Take 1 tablet (10 mg total) by mouth daily. 90 tablet 3   aspirin 81 MG chewable tablet Chew 1 tablet (81 mg total) by mouth at bedtime. 90 tablet 3   atorvastatin (LIPITOR) 40 MG tablet Take 1 tablet (40 mg total) by mouth daily. 90 tablet 3   benazepril (LOTENSIN) 40 MG tablet TAKE 1 TABLET BY MOUTH EVERY DAY 90 tablet 0   finasteride (PROSCAR) 5 MG tablet TAKE 1 TABLET (5 MG TOTAL) BY MOUTH DAILY. 90 tablet 3   gabapentin (NEURONTIN) 300 MG capsule Take 1 capsule (300 mg total) by mouth 2 (two) times daily AND 2 capsules (600 mg total) at bedtime. 120 capsule 3   nitroGLYCERIN (NITROSTAT) 0.4 MG SL tablet Place 1 tablet (0.4 mg total) under the tongue every 5 (five) minutes as needed for chest pain. 25 tablet 0   oxybutynin (DITROPAN) 5 MG tablet Take 1 tablet (5 mg total) by mouth every 8 (eight) hours as needed for bladder spasms. 30 tablet 2   No current facility-administered medications for this visit.     Review of Systems    He denies chest pain, palpitations, dyspnea, pnd, orthopnea, n, v, dizziness, syncope, edema, weight gain, or early satiety.   All other systems reviewed and are otherwise negative except as noted above.    Physical Exam    VS:  BP 127/66 (BP Location: Left Arm, Patient Position: Sitting, Cuff Size: Normal)   Pulse (!) 54   Ht 5\' 9"  (1.753 m)   Wt 192 lb 12.8 oz (87.5 kg)   SpO2 97%   BMI 28.47 kg/m  , BMI Body mass index is 28.47 kg/m.     GEN: Well nourished, well developed, in no acute distress. HEENT: normal. Neck: Supple, no JVD, carotid bruits, or masses. Cardiac: RRR, no murmurs, rubs, or gallops. No clubbing, cyanosis, edema.  Radials 2+/PT 2+ and equal bilaterally.  Respiratory:  Respirations regular and unlabored, clear to auscultation bilaterally. GI: Soft, nontender, nondistended, BS + x 4. MS: no deformity or atrophy. Skin: warm and dry, no rash. Neuro:  Strength and sensation are intact. Psych: Normal affect.  Accessory Clinical Findings    ECG personally reviewed by me today - EKG Interpretation Date/Time:  Thursday December 08 2022 11:45:13 EDT Ventricular Rate:  51 PR Interval:  158 QRS Duration:  142 QT Interval:  442 QTC Calculation: 407 R Axis:   -17  Text Interpretation: Sinus bradycardia Right bundle branch block Confirmed by Nicolasa Ducking [id number]) on 12/08/2022 11:54:32 AM  - no acute changes.  Lab Results  Component Value Date   WBC 8.8 09/01/2022   HGB 13.7 09/01/2022   HCT 41.7 09/01/2022   MCV 91 09/01/2022   PLT 285 09/01/2022   Lab Results  Component Value Date   CREATININE 1.34 (H) 09/01/2022   BUN 16 09/01/2022   NA 142 09/01/2022   K 5.8 (H) 09/01/2022   CL 106 09/01/2022   CO2 24 09/01/2022   Lab Results  Component Value Date   ALT 13 09/01/2022   AST 14 09/01/2022   ALKPHOS 89 09/01/2022   BILITOT 0.4 09/01/2022   Lab Results  Component Value Date   CHOL 134 09/01/2022   HDL 36 (L) 09/01/2022   LDLCALC 68 09/01/2022   TRIG 179 (H) 09/01/2022   CHOLHDL 3.7 09/01/2022    Lab Results  Component Value Date   HGBA1C 6.8 (H) 09/01/2022     Assessment & Plan    1.  CAD:  s/p inf MI and BMS to the RCA in 2013.  He has been doing well over the past year without symptoms or limitations.  He has adjusted his diet and has lost 10 pounds over the past 3 months.  He plans to increase his activity and hopes to get his BMI < 25.  He remains remains on asa, statin, and ccb therapy.  2.  HTN:  BP well-controlled today on amlodipine and benazepril.  3.  HL:  LDL 68 in June w/ nl LFTs @ that time.  Cont atorva.  4.  CKD III:  Creat stable @ 1.34 in June.  5.  DMII:  A1c 6.8 in June.  Currently diet controlled.  He has done a good job thus far adjusting his diet and is down 10 lbs.  Encouraged to keep up the good work and incorporate 30 mins of exercise at least 5x/wk, into his routine.  6.  Disposition:  f/u in 1 yr or sooner if necessary.      Nicolasa Ducking, NP 12/08/2022, 12:41 PM

## 2022-12-08 NOTE — Assessment & Plan Note (Signed)
No recent flares, patient is on allopurinol for prevention. -Continue allopurinol.

## 2022-12-08 NOTE — Assessment & Plan Note (Signed)
Well-controlled on current regimen of amlodipine 10mg  daily and benazepril 40mg  daily. -Continue current antihypertensive regimen.

## 2022-12-08 NOTE — Assessment & Plan Note (Signed)
Recent increase in A1c from 6.4 to 6.8, indicating progression from prediabetes to diabetes. Patient has made significant lifestyle changes including weight loss and dietary modifications, resulting in self-reported improved fasting blood glucose levels. -Check A1c today to assess current glycemic control. -Encourage continued lifestyle modifications including diet and exercise. -Plan to discuss potential need for medication management if A1c remains above 6.5.

## 2022-12-08 NOTE — Progress Notes (Signed)
Established Patient Office Visit  Subjective   Patient ID: Kevin Melton, male    DOB: 08/01/1950  Age: 72 y.o. MRN: [id number]  Chief Complaint  Patient presents with   Medical Management of Chronic Issues    Request shingles vacc    HPI  Discussed the use of AI scribe software for clinical note transcription with the patient, who gave verbal consent to proceed.  History of Present Illness   The patient, with a history of diabetes and shingles, presents for a routine check-up. He reports recent efforts to manage his blood sugar levels, including dietary changes and weight loss of approximately 10 pounds. He expresses a desire to lose more weight and achieve a normal BMI, but notes that time constraints and caregiving responsibilities have made it difficult to exercise regularly. He reports checking his blood sugar levels in the morning, which have been between 105 and 115.  The patient also discusses his history of shingles, noting that he still experiences nerve pain, particularly in his back. He reports managing the pain with gabapentin, taken two to three times a day. The pain is not severe, but is noticeable and sensitive to touch.         ROS    Objective:     BP 113/61 (BP Location: Right Arm, Patient Position: Sitting, Cuff Size: Normal)   Pulse (!) 49   Temp 98.1 F (36.7 C) (Oral)   Ht 5\' 9"  (1.753 m)   Wt 191 lb 8 oz (86.9 kg)   SpO2 100%   BMI 28.28 kg/m    Physical Exam Vitals reviewed.  Constitutional:      General: He is not in acute distress.    Appearance: Normal appearance. He is not diaphoretic.  HENT:     Head: Normocephalic and atraumatic.  Eyes:     General: No scleral icterus.    Conjunctiva/sclera: Conjunctivae normal.  Cardiovascular:     Rate and Rhythm: Normal rate and regular rhythm.     Heart sounds: Normal heart sounds. No murmur heard. Pulmonary:     Effort: Pulmonary effort is normal. No respiratory distress.     Breath  sounds: Normal breath sounds. No wheezing or rhonchi.  Musculoskeletal:     Cervical back: Neck supple.     Right lower leg: No edema.     Left lower leg: No edema.  Lymphadenopathy:     Cervical: No cervical adenopathy.  Skin:    General: Skin is warm and dry.     Findings: No rash.  Neurological:     Mental Status: He is alert and oriented to person, place, and time. Mental status is at baseline.  Psychiatric:        Mood and Affect: Mood normal.        Behavior: Behavior normal.      No results found for any visits on 12/08/22.    The ASCVD Risk score (Arnett DK, et al., 2019) failed to calculate for the following reasons:   The patient has a prior MI or stroke diagnosis    Assessment & Plan:   Problem List Items Addressed This Visit       Cardiovascular and Mediastinum   Essential hypertension with goal blood pressure less than 130/80    Well-controlled on current regimen of amlodipine 10mg  daily and benazepril 40mg  daily. -Continue current antihypertensive regimen.      Relevant Orders   Basic Metabolic Panel (BMET)     Nervous and Auditory  Post herpetic neuralgia    Persistent sensitivity and occasional pain in the area affected by previous shingles outbreak. Patient is currently managing symptoms with gabapentin. -Continue gabapentin as needed for symptom management.        Genitourinary   CKD stage 3a, GFR 45-59 ml/min (HCC)    Chronic and stable Recheck metabolic panel Avoid nephrotoxic meds       Relevant Orders   Basic Metabolic Panel (BMET)     Other   Gout    No recent flares, patient is on allopurinol for prevention. -Continue allopurinol.      Mixed hyperlipidemia    Well-controlled on atorvastatin 40mg  daily. -Continue atorvastatin 40mg  daily.      Prediabetes    Recent increase in A1c from 6.4 to 6.8, indicating progression from prediabetes to diabetes. Patient has made significant lifestyle changes including weight loss and  dietary modifications, resulting in self-reported improved fasting blood glucose levels. -Check A1c today to assess current glycemic control. -Encourage continued lifestyle modifications including diet and exercise. -Plan to discuss potential need for medication management if A1c remains above 6.5.      Relevant Orders   Hemoglobin A1c   Other Visit Diagnoses     Flu vaccine need    -  Primary   Relevant Orders   Flu Vaccine Trivalent High Dose (Fluad) (Completed)   Hyperkalemia       Relevant Orders   Basic Metabolic Panel (BMET)   Need for zoster vaccination       Relevant Orders   Zoster Recombinant (Shingrix ) (Completed)           General Health Maintenance -Administer influenza and first dose of shingles vaccine today. -Plan for second dose of shingles vaccine at next visit in March. -Check potassium levels today due to previous elevated level. -Plan for follow-up visit in six months.        Return in about 6 months (around 06/08/2023) for CPE, as scheduled.    [name], MD

## 2022-12-09 ENCOUNTER — Other Ambulatory Visit: Payer: Self-pay

## 2022-12-09 DIAGNOSIS — E875 Hyperkalemia: Secondary | ICD-10-CM

## 2022-12-09 LAB — BASIC METABOLIC PANEL
BUN/Creatinine Ratio: 17 (ref 10–24)
BUN: 23 mg/dL (ref 8–27)
CO2: 25 mmol/L (ref 20–29)
Calcium: 9.4 mg/dL (ref 8.6–10.2)
Chloride: 105 mmol/L (ref 96–106)
Creatinine, Ser: 1.39 mg/dL — ABNORMAL HIGH (ref 0.76–1.27)
Glucose: 109 mg/dL — ABNORMAL HIGH (ref 70–99)
Potassium: 5.5 mmol/L — ABNORMAL HIGH (ref 3.5–5.2)
Sodium: 141 mmol/L (ref 134–144)
eGFR: 54 mL/min/{1.73_m2} — ABNORMAL LOW (ref >59)

## 2022-12-09 LAB — HEMOGLOBIN A1C
Est. average glucose Bld gHb Est-mCnc: 134 mg/dL
Hgb A1c MFr Bld: 6.3 % — ABNORMAL HIGH (ref 4.8–5.6)

## 2022-12-27 ENCOUNTER — Telehealth: Payer: Self-pay | Admitting: Pharmacist

## 2022-12-27 NOTE — Progress Notes (Signed)
12/27/2022  Patient ID: Kevin Melton, male   DOB: [birth year], 72 y.o.   MRN: [id number]  Pharmacy Quality Measure Review  This patient is appearing on a report for being at risk of failing the adherence measure for cholesterol (statin) and hypertension (ACEi/ARB) medications this calendar year.   Medication: Benazepril 40mg  and Atorvastatin 40mg  Last fill date: 12/05/22 for 90 day supply  Insurance report was not up to date. No action needed at this time.   Marlowe Aschoff, PharmD [facility] Phone Number: [phone]

## 2023-01-02 ENCOUNTER — Other Ambulatory Visit: Payer: Self-pay

## 2023-01-02 DIAGNOSIS — N1330 Unspecified hydronephrosis: Secondary | ICD-10-CM

## 2023-01-02 DIAGNOSIS — E875 Hyperkalemia: Secondary | ICD-10-CM | POA: Diagnosis not present

## 2023-01-03 LAB — BASIC METABOLIC PANEL
BUN/Creatinine Ratio: 17 (ref 10–24)
BUN: 21 mg/dL (ref 8–27)
CO2: 21 mmol/L (ref 20–29)
Calcium: 8.9 mg/dL (ref 8.6–10.2)
Chloride: 105 mmol/L (ref 96–106)
Creatinine, Ser: 1.27 mg/dL (ref 0.76–1.27)
Glucose: 106 mg/dL — ABNORMAL HIGH (ref 70–99)
Potassium: 4.9 mmol/L (ref 3.5–5.2)
Sodium: 140 mmol/L (ref 134–144)
eGFR: 60 mL/min/{1.73_m2} (ref >59)

## 2023-01-24 DIAGNOSIS — D2262 Melanocytic nevi of left upper limb, including shoulder: Secondary | ICD-10-CM | POA: Diagnosis not present

## 2023-01-24 DIAGNOSIS — L821 Other seborrheic keratosis: Secondary | ICD-10-CM | POA: Diagnosis not present

## 2023-01-24 DIAGNOSIS — Z85820 Personal history of malignant melanoma of skin: Secondary | ICD-10-CM | POA: Diagnosis not present

## 2023-01-24 DIAGNOSIS — D2261 Melanocytic nevi of right upper limb, including shoulder: Secondary | ICD-10-CM | POA: Diagnosis not present

## 2023-01-24 DIAGNOSIS — D225 Melanocytic nevi of trunk: Secondary | ICD-10-CM | POA: Diagnosis not present

## 2023-01-24 DIAGNOSIS — D2271 Melanocytic nevi of right lower limb, including hip: Secondary | ICD-10-CM | POA: Diagnosis not present

## 2023-01-24 DIAGNOSIS — Z08 Encounter for follow-up examination after completed treatment for malignant neoplasm: Secondary | ICD-10-CM | POA: Diagnosis not present

## 2023-01-24 DIAGNOSIS — D2272 Melanocytic nevi of left lower limb, including hip: Secondary | ICD-10-CM | POA: Diagnosis not present

## 2023-01-27 DIAGNOSIS — H2513 Age-related nuclear cataract, bilateral: Secondary | ICD-10-CM | POA: Diagnosis not present

## 2023-01-31 DIAGNOSIS — H35373 Puckering of macula, bilateral: Secondary | ICD-10-CM | POA: Diagnosis not present

## 2023-01-31 DIAGNOSIS — H2511 Age-related nuclear cataract, right eye: Secondary | ICD-10-CM | POA: Diagnosis not present

## 2023-01-31 DIAGNOSIS — H25042 Posterior subcapsular polar age-related cataract, left eye: Secondary | ICD-10-CM | POA: Diagnosis not present

## 2023-01-31 DIAGNOSIS — H2512 Age-related nuclear cataract, left eye: Secondary | ICD-10-CM | POA: Diagnosis not present

## 2023-01-31 DIAGNOSIS — H25041 Posterior subcapsular polar age-related cataract, right eye: Secondary | ICD-10-CM | POA: Diagnosis not present

## 2023-02-03 DIAGNOSIS — R339 Retention of urine, unspecified: Secondary | ICD-10-CM | POA: Diagnosis not present

## 2023-02-13 DIAGNOSIS — H25041 Posterior subcapsular polar age-related cataract, right eye: Secondary | ICD-10-CM | POA: Diagnosis not present

## 2023-02-20 ENCOUNTER — Encounter: Payer: Self-pay | Admitting: Ophthalmology

## 2023-02-20 NOTE — Anesthesia Preprocedure Evaluation (Addendum)
Anesthesia Evaluation  Patient identified by MRN, date of birth, ID band Patient awake    Reviewed: Allergy & Precautions, H&P , NPO status , Patient's Chart, lab work & pertinent test results  Airway Mallampati: I  TM Distance: >3 FB Neck ROM: Full    Dental no notable dental hx. (+) Edentulous Upper, Edentulous Lower   Pulmonary neg pulmonary ROS   Pulmonary exam normal breath sounds clear to auscultation       Cardiovascular hypertension, + angina  + CAD, + Past MI and + Cardiac Stents  Normal cardiovascular exam+ dysrhythmias + Valvular Problems/Murmurs MR  Rhythm:Regular Rate:Normal  11-19-20 1. Left ventricular ejection fraction, by estimation, is 60 to 65%. The  left ventricle has normal function. The left ventricle has no regional  wall motion abnormalities. There is mild left ventricular hypertrophy.  Left ventricular diastolic parameters  are consistent with Grade I diastolic dysfunction (impaired relaxation).  The average left ventricular global longitudinal strain is -20.7 %. The  global longitudinal strain is normal.   2. Right ventricular systolic function is normal. The right ventricular  size is normal. There is normal pulmonary artery systolic pressure. The  estimated right ventricular systolic pressure is 20.5 mmHg.   3. The mitral valve is normal in structure. Mild mitral valve  regurgitation.   4. The aortic valve is normal in structure. Aortic valve regurgitation is  mild.   5. The inferior vena cava is normal in size with greater than 50%  respiratory variability, suggesting right atrial pressure of 3 mmHg.  There's an EKG, but  I can't see it, just that there was one done.  Hx inferior MI   Neuro/Psych  Neuromuscular disease negative neurological ROS  negative psych ROS   GI/Hepatic negative GI ROS, Neg liver ROS,GERD  ,,  Endo/Other  negative endocrine ROS    Renal/GU Renal diseasenegative Renal  ROS  negative genitourinary   Musculoskeletal negative musculoskeletal ROS (+)    Abdominal   Peds negative pediatric ROS (+)  Hematology negative hematology ROS (+)   Anesthesia Other Findings History of umbilical hernia repair  GERD (gastroesophageal reflux disease) Kidney stones Coronary artery disease Chronic kidney disease, stage III (moderate)  Inferior MI Feb 2013, treated w/BMS Hyperlipidemia  Hypertension RBBB  Diastolic dysfunction Skin cancer  Bradycardia Gout  Hepatic cyst BPH (benign prostatic hyperplasia) Dyshidrotic eczema Prediabetes  Aortic atherosclerosis  Sigmoid diverticulosis  Acontractile bladder Mild mitral regurg Mild aortic regurg Grade I diastolic dysfunction  03-29-22 cystosPast Surgical History: 2013: COLONOSCOPY 2013: CORONARY ANGIOPLASTY WITH STENT PLACEMENT        Reproductive/Obstetrics negative OB ROS                             Anesthesia Physical Anesthesia Plan  ASA: 3  Anesthesia Plan: MAC   Post-op Pain Management:    Induction: Intravenous  PONV Risk Score and Plan:   Airway Management Planned: Natural Airway and Nasal Cannula  Additional Equipment:   Intra-op Plan:   Post-operative Plan:   Informed Consent: I have reviewed the patients History and Physical, chart, labs and discussed the procedure including the risks, benefits and alternatives for the proposed anesthesia with the patient or authorized representative who has indicated his/her understanding and acceptance.     Dental Advisory Given  Plan Discussed with: Anesthesiologist, CRNA and Surgeon  Anesthesia Plan Comments: (Patient consented for risks of anesthesia including but not limited to:  - adverse reactions  to medications - damage to eyes, teeth, lips or other oral mucosa - nerve damage due to positioning  - sore throat or hoarseness - Damage to heart, brain, nerves, lungs, other parts of body or loss of  life  Patient voiced understanding and assent.)        Anesthesia Quick Evaluation

## 2023-02-22 NOTE — Discharge Instructions (Signed)

## 2023-03-02 ENCOUNTER — Ambulatory Visit: Payer: PPO | Admitting: Anesthesiology

## 2023-03-02 ENCOUNTER — Other Ambulatory Visit: Payer: Self-pay

## 2023-03-02 ENCOUNTER — Ambulatory Visit
Admission: RE | Admit: 2023-03-02 | Discharge: 2023-03-02 | Disposition: A | Discharge status: Home / Self Care | Payer: PPO | Attending: Ophthalmology | Admitting: Ophthalmology

## 2023-03-02 ENCOUNTER — Encounter
Admission: RE | Disposition: A | Discharge status: Home / Self Care | Payer: Self-pay | Source: Home / Self Care | Attending: Ophthalmology

## 2023-03-02 ENCOUNTER — Encounter: Payer: Self-pay | Admitting: Ophthalmology

## 2023-03-02 DIAGNOSIS — I2510 Atherosclerotic heart disease of native coronary artery without angina pectoris: Secondary | ICD-10-CM | POA: Diagnosis not present

## 2023-03-02 DIAGNOSIS — H2511 Age-related nuclear cataract, right eye: Secondary | ICD-10-CM | POA: Diagnosis not present

## 2023-03-02 DIAGNOSIS — I252 Old myocardial infarction: Secondary | ICD-10-CM | POA: Insufficient documentation

## 2023-03-02 DIAGNOSIS — K219 Gastro-esophageal reflux disease without esophagitis: Secondary | ICD-10-CM | POA: Insufficient documentation

## 2023-03-02 DIAGNOSIS — I129 Hypertensive chronic kidney disease with stage 1 through stage 4 chronic kidney disease, or unspecified chronic kidney disease: Secondary | ICD-10-CM | POA: Diagnosis not present

## 2023-03-02 DIAGNOSIS — H25091 Other age-related incipient cataract, right eye: Secondary | ICD-10-CM | POA: Diagnosis not present

## 2023-03-02 DIAGNOSIS — N183 Chronic kidney disease, stage 3 unspecified: Secondary | ICD-10-CM | POA: Diagnosis not present

## 2023-03-02 DIAGNOSIS — I080 Rheumatic disorders of both mitral and aortic valves: Secondary | ICD-10-CM | POA: Diagnosis not present

## 2023-03-02 HISTORY — DX: Motion sickness, initial encounter: T75.3XXA

## 2023-03-02 HISTORY — DX: Atherosclerotic heart disease of native coronary artery with unstable angina pectoris: I25.110

## 2023-03-02 HISTORY — DX: Presence of coronary angioplasty implant and graft: Z95.5

## 2023-03-02 HISTORY — PX: CATARACT EXTRACTION W/PHACO: SHX586

## 2023-03-02 HISTORY — DX: Other postherpetic nervous system involvement: B02.29

## 2023-03-02 HISTORY — DX: Presence of dental prosthetic device (complete) (partial): Z97.2

## 2023-03-02 SURGERY — PHACOEMULSIFICATION, CATARACT, WITH IOL INSERTION
Anesthesia: Monitor Anesthesia Care | Laterality: Right

## 2023-03-02 MED ORDER — ARMC OPHTHALMIC DILATING DROPS
OPHTHALMIC | Status: AC
  Filled 2023-03-02: qty 0.5

## 2023-03-02 MED ORDER — MIDAZOLAM HCL 2 MG/2ML IJ SOLN
INTRAMUSCULAR | Status: AC
  Filled 2023-03-02: qty 2

## 2023-03-02 MED ORDER — PHENYLEPHRINE-KETOROLAC 1-0.3 % IO SOLN
INTRAOCULAR | Status: DC | PRN
  Administered 2023-03-02: 81 mL via OPHTHALMIC

## 2023-03-02 MED ORDER — BRIMONIDINE TARTRATE-TIMOLOL 0.2-0.5 % OP SOLN
OPHTHALMIC | Status: DC | PRN
  Administered 2023-03-02: 1 [drp] via OPHTHALMIC

## 2023-03-02 MED ORDER — TETRACAINE HCL 0.5 % OP SOLN
1.0000 [drp] | OPHTHALMIC | Status: DC | PRN
  Administered 2023-03-02 (×3): 1 [drp] via OPHTHALMIC

## 2023-03-02 MED ORDER — SIGHTPATH DOSE#1 BSS IO SOLN
INTRAOCULAR | Status: DC | PRN
  Administered 2023-03-02: 15 mL via INTRAOCULAR

## 2023-03-02 MED ORDER — TETRACAINE HCL 0.5 % OP SOLN
OPHTHALMIC | Status: AC
  Filled 2023-03-02: qty 4

## 2023-03-02 MED ORDER — LIDOCAINE HCL (PF) 2 % IJ SOLN
INTRAOCULAR | Status: DC | PRN
  Administered 2023-03-02: 1 mL via INTRAOCULAR

## 2023-03-02 MED ORDER — SIGHTPATH DOSE#1 NA HYALUR & NA CHOND-NA HYALUR IO KIT
PACK | INTRAOCULAR | Status: DC | PRN
  Administered 2023-03-02: 1 via OPHTHALMIC

## 2023-03-02 MED ORDER — MIDAZOLAM HCL 2 MG/2ML IJ SOLN
INTRAMUSCULAR | Status: DC | PRN
  Administered 2023-03-02: 2 mg via INTRAVENOUS

## 2023-03-02 MED ORDER — FENTANYL CITRATE (PF) 100 MCG/2ML IJ SOLN
INTRAMUSCULAR | Status: AC
  Filled 2023-03-02: qty 2

## 2023-03-02 MED ORDER — MOXIFLOXACIN HCL 0.5 % OP SOLN
OPHTHALMIC | Status: DC | PRN
  Administered 2023-03-02: .2 mL via OPHTHALMIC

## 2023-03-02 MED ORDER — ARMC OPHTHALMIC DILATING DROPS
1.0000 | OPHTHALMIC | Status: DC | PRN
  Administered 2023-03-02 (×3): 1 via OPHTHALMIC

## 2023-03-02 SURGICAL SUPPLY — 10 items
CATARACT SUITE SIGHTPATH (MISCELLANEOUS) ×1 IMPLANT
DISSECTOR HYDRO NUCLEUS 50X22 (MISCELLANEOUS) ×1 IMPLANT
DRSG TEGADERM 2-3/8X2-3/4 SM (GAUZE/BANDAGES/DRESSINGS) ×1 IMPLANT
FEE CATARACT SUITE SIGHTPATH (MISCELLANEOUS) ×1 IMPLANT
GLOVE SURG SYN 7.5 E (GLOVE) ×1 IMPLANT
GLOVE SURG SYN 7.5 PF PI (GLOVE) ×1 IMPLANT
GLOVE SURG SYN 8.5 E (GLOVE) ×1 IMPLANT
GLOVE SURG SYN 8.5 PF PI (GLOVE) ×1 IMPLANT
LENS IOL CLRN 11.5 IMPLANT
LENS IOL MONOFOCAL CLAREON ×1 IMPLANT

## 2023-03-02 NOTE — Anesthesia Postprocedure Evaluation (Signed)
Anesthesia Post Note  Patient: Kevin Melton  Procedure(s) Performed: CATARACT EXTRACTION PHACO AND INTRAOCULAR LENS PLACEMENT (IOC) RIGHT MALYUGIN OMIDRIA 9.37 00:55.6 (Right)  Patient location during evaluation: PACU Anesthesia Type: MAC Level of consciousness: awake and alert Pain management: pain level controlled Vital Signs Assessment: post-procedure vital signs reviewed and stable Respiratory status: spontaneous breathing, nonlabored ventilation, respiratory function stable and patient connected to nasal cannula oxygen Cardiovascular status: stable and blood pressure returned to baseline Postop Assessment: no apparent nausea or vomiting Anesthetic complications: no   No notable events documented.   Last Vitals:  Vitals:   03/02/23 1331 03/02/23 1338  BP: 137/65 (!) 147/67  Pulse: (!) 54 (!) 53  Resp: 14 15  Temp: 36.6 C 36.6 C  SpO2: 97% 96%    Last Pain:  Vitals:   03/02/23 1338  TempSrc:   PainSc: 0-No pain                 Everley Evora C Imya Mance

## 2023-03-02 NOTE — H&P (Signed)
[facility]   Primary Care Physician:  Erasmo Downer, MD Ophthalmologist: Dr. Deberah Pelton  Pre-Procedure History & Physical: HPI:  Kevin Melton is a 72 y.o. male here for cataract surgery.   Past Medical History:  Diagnosis Date   Acontractile bladder 06/17/2021   a.) secondary to BPH/detrusor hypotonicity; b.) requires self CIC 4x/day   Aortic atherosclerosis (HCC)    Atherosclerotic heart disease of native coronary artery with unstable angina pectoris (HCC)    BPH (benign prostatic hyperplasia)    Bradycardia    Chronic kidney disease, stage III (moderate) (HCC)    Coronary artery disease    a. 2013 Inf MI/PCI: BMS to RCA; b. 11/2013 MV: small reversible apical anteroseptal defect/ischemia. EF 60%; c. 11/2020 MV: small, fixed basal inf defect - scar vs artifact. No ischemia. EF 45-50% (60-65% by echo).   Diastolic dysfunction    a.) TTE 11/19/2020: EF 60-65%, mild LVH, mild MR/AR, G1DD   Dyshidrotic eczema    GERD (gastroesophageal reflux disease)    Gout    Hepatic cyst    History of heart artery stent    History of umbilical hernia repair    Hyperlipidemia    Hypertension    Inferior MI (HCC) 04/2011   a.) MI 04/2011 --> treated in [place] --> BMS (unknown type) to RCA   Kidney stones    Motion sickness    Boats and circular motion   Prediabetes    RBBB    Sigmoid diverticulosis    Skin cancer    Wears dentures    Has full upper and lower.  Only wears upper    Past Surgical History:  Procedure Laterality Date   COLONOSCOPY  2013   CORONARY ANGIOPLASTY WITH STENT PLACEMENT  2013   [place]   CYSTOSCOPY/URETEROSCOPY/HOLMIUM LASER/STENT PLACEMENT Right 03/29/2022   Procedure: CYSTOSCOPY/URETEROSCOPY/HOLMIUM LASER/STENT PLACEMENT/BILATERAL RETROGRADE ZOXWRUEAV;  Surgeon: Riki Altes, MD;  Location: [facility] ORS;  Service: Urology;  Laterality: Right;   ESOPHAGOGASTRODUODENOSCOPY N/A 12/08/2021   Procedure: ESOPHAGOGASTRODUODENOSCOPY  (EGD);  Surgeon: Toledo, Boykin Nearing, MD;  Location: [facility] ENDOSCOPY;  Service: Gastroenterology;  Laterality: N/A;   ESOPHAGOGASTRODUODENOSCOPY (EGD) WITH PROPOFOL N/A 01/05/2017   Procedure: ESOPHAGOGASTRODUODENOSCOPY (EGD) WITH PROPOFOL With Dilation;  Surgeon: Toney Reil, MD;  Location: [facility] ENDOSCOPY;  Service: Gastroenterology;  Laterality: N/A;   ESOPHAGOGASTRODUODENOSCOPY (EGD) WITH PROPOFOL N/A 07/03/2019   Procedure: ESOPHAGOGASTRODUODENOSCOPY (EGD) WITH PROPOFOL;  Surgeon: Toney Reil, MD;  Location: [facility] ENDOSCOPY;  Service: Gastroenterology;  Laterality: N/A;   ESOPHAGOGASTRODUODENOSCOPY (EGD) WITH PROPOFOL N/A 11/04/2019   Procedure: ESOPHAGOGASTRODUODENOSCOPY (EGD) WITH PROPOFOL;  Surgeon: Toney Reil, MD;  Location: [facility] ENDOSCOPY;  Service: Gastroenterology;  Laterality: N/A;   HERNIA REPAIR     LITHOTRIPSY  2014   UMBILICAL HERNIA REPAIR N/A 02/20/2017   Primary repair 2 cm umbilical hernia;  Surgeon: Earline Mayotte, MD;  Location: [facility] ORS;  Service: General;  Laterality: N/A;   URETEROSCOPY Left 03/29/2022   Procedure: DIAGNOSTIC URETEROSCOPY/LEFT STENT PLACEMENT;  Surgeon: Riki Altes, MD;  Location: [facility] ORS;  Service: Urology;  Laterality: Left;    Prior to Admission medications   Medication Sig Start Date End Date Taking? Authorizing Provider  allopurinol (ZYLOPRIM) 300 MG tablet TAKE 1 TABLET BY MOUTH EVERYDAY AT BEDTIME 09/01/22  Yes Bacigalupo, Marzella Schlein, MD  amLODipine (NORVASC) 10 MG tablet Take 1 tablet (10 mg total) by mouth daily. 12/08/22  Yes Furth, Cadence H, PA-C  aspirin 81 MG chewable tablet Chew 1 tablet (  81 mg total) by mouth at bedtime. 12/08/22  Yes Furth, Cadence H, PA-C  atorvastatin (LIPITOR) 40 MG tablet Take 1 tablet (40 mg total) by mouth daily. 12/08/22  Yes Furth, Cadence H, PA-C  benazepril (LOTENSIN) 40 MG tablet TAKE 1 TABLET BY MOUTH EVERY DAY 12/05/22  Yes Bacigalupo, Marzella Schlein, MD  finasteride (PROSCAR) 5 MG tablet TAKE  1 TABLET (5 MG TOTAL) BY MOUTH DAILY. 08/11/22  Yes Stoioff, Verna Czech, MD  nitroGLYCERIN (NITROSTAT) 0.4 MG SL tablet Place 1 tablet (0.4 mg total) under the tongue every 5 (five) minutes as needed for chest pain. 11/03/21  Yes Creig Hines, NP  gabapentin (NEURONTIN) 300 MG capsule Take 1 capsule (300 mg total) by mouth 2 (two) times daily AND 2 capsules (600 mg total) at bedtime. Patient not taking: Reported on 02/20/2023 09/01/22   Erasmo Downer, MD  oxybutynin (DITROPAN) 5 MG tablet Take 1 tablet (5 mg total) by mouth every 8 (eight) hours as needed for bladder spasms. Patient not taking: Reported on 02/20/2023 03/11/22   Carman Ching, PA-C    Allergies as of 02/01/2023   (No Known Allergies)    Family History  Problem Relation Age of Onset   Dementia Mother 32   Lung disease Father 86       collapsed lung   Healthy Sister    Healthy Brother    Heart Problems Brother    Healthy Son    Healthy Sister    Colon cancer Neg Hx    Prostate cancer Neg Hx    Heart disease Neg Hx     Social History   Socioeconomic History   Marital status: Married    Spouse name: Kevin Melton   Number of children: 1   Years of education: 12   Highest education level: High school graduate  Occupational History   Occupation: Self Employed    Comment: [employer]    Comment: semi-retired  Tobacco Use   Smoking status: Never   Smokeless tobacco: Never  Vaping Use   Vaping status: Never Used  Substance and Sexual Activity   Alcohol use: No   Drug use: No   Sexual activity: Yes    Partners: Female  Other Topics Concern   Not on file  Social History Narrative   Not on file   Social Drivers of Health   Financial Resource Strain: Low Risk  (05/04/2022)   Overall Financial Resource Strain (CARDIA)    Difficulty of Paying Living Expenses: Not hard at all  Food Insecurity: No Food Insecurity (05/04/2022)   Hunger Vital Sign    Worried About Running Out of  Food in the Last Year: Never true    Ran Out of Food in the Last Year: Never true  Transportation Needs: No Transportation Needs (05/04/2022)   PRAPARE - Administrator, Civil Service (Medical): No    Lack of Transportation (Non-Medical): No  Physical Activity: Inactive (05/04/2022)   Exercise Vital Sign    Days of Exercise per Week: 0 days    Minutes of Exercise per Session: 0 min  Stress: No Stress Concern Present (05/04/2022)   [employer] of Occupational Health - Occupational Stress Questionnaire    Feeling of Stress : Not at all  Social Connections: Moderately Isolated (05/04/2022)   Social Connection and Isolation Panel [NHANES]    Frequency of Communication with Friends and Family: More than three times a week    Frequency of Social Gatherings with  Friends and Family: Once a week    Attends Religious Services: Never    Database administrator or Organizations: No    Attends Banker Meetings: Never    Marital Status: Married  Catering manager Violence: Not At Risk (05/04/2022)   Humiliation, Afraid, Rape, and Kick questionnaire    Fear of Current or Ex-Partner: No    Emotionally Abused: No    Physically Abused: No    Sexually Abused: No    Review of Systems: See HPI, otherwise negative ROS  Physical Exam: Ht 5\' 9"  (1.753 m)   Wt 86.2 kg   BMI 28.06 kg/m  General:   Alert, cooperative in NAD Head:  Normocephalic and atraumatic. Respiratory:  Normal work of breathing. Cardiovascular:  RRR  Impression/Plan: Kevin Melton is here for cataract surgery.  Risks, benefits, limitations, and alternatives regarding cataract surgery have been reviewed with the patient.  Questions have been answered.  All parties agreeable.   Estanislado Pandy, MD  03/02/2023, 9:04 AM

## 2023-03-02 NOTE — Op Note (Signed)
OPERATIVE NOTE  Kevin Melton [id number] 03/02/2023   PREOPERATIVE DIAGNOSIS: Nuclear sclerotic cataract right eye. H25.11   POSTOPERATIVE DIAGNOSIS: Nuclear sclerotic cataract right eye. H25.11   PROCEDURE:  Phacoemusification with posterior chamber intraocular lens placement of the right eye  Ultrasound time: Procedure(s): CATARACT EXTRACTION PHACO AND INTRAOCULAR LENS PLACEMENT (IOC) RIGHT MALYUGIN OMIDRIA 9.37 00:55.6 (Right)  LENS:   Implant Name Type Inv. Item Serial No. Manufacturer Lot No. LRB No. Used Action  CLAREON IOL 11.5   [id number]   Right 1 Implanted      SURGEON:  Julious Payer. Rolley Sims, MD   ANESTHESIA:  Topical with tetracaine drops, augmented with 1% preservative-free intracameral lidocaine.   COMPLICATIONS:  None.   DESCRIPTION OF PROCEDURE:  The patient was identified in the holding room and transported to the operating room and placed in the supine position under the operating microscope.  The right eye was identified as the operative eye, which was prepped and draped in the usual sterile ophthalmic fashion.   A 1 millimeter clear-corneal paracentesis was made superotemporally. Preservative-free 1% lidocaine mixed with 1:1,000 bisulfite-free aqueous solution of epinephrine was injected into the anterior chamber. The anterior chamber was then filled with Viscoat viscoelastic. A 2.4 millimeter keratome was used to make a clear-corneal incision inferotemporally. A curvilinear capsulorrhexis was made with a cystotome and capsulorrhexis forceps. Balanced salt solution was used to hydrodissect and hydrodelineate the nucleus. Phacoemulsification was then used to remove the lens nucleus and epinucleus. The remaining cortex was then removed using the irrigation and aspiration handpiece. Provisc was then placed into the capsular bag to distend it for lens placement. A +11.50 D [id number] intraocular lens was then injected into the capsular bag. The remaining viscoelastic was  aspirated.   Wounds were hydrated with balanced salt solution.  The anterior chamber was inflated to a physiologic pressure with balanced salt solution.  No wound leaks were noted. Vigamox was injected intracamerally.  Timolol and Brimonidine drops were applied to the eye.  The patient was taken to the recovery room in stable condition without complications of anesthesia or surgery.  [name] 03/02/2023, 1:31 PM

## 2023-03-02 NOTE — Transfer of Care (Signed)
Immediate Anesthesia Transfer of Care Note  Patient: Kevin Melton  Procedure(s) Performed: CATARACT EXTRACTION PHACO AND INTRAOCULAR LENS PLACEMENT (IOC) RIGHT MALYUGIN OMIDRIA 9.37 00:55.6 (Right)  Patient Location: PACU  Anesthesia Type: MAC  Level of Consciousness: awake, alert  and patient cooperative  Airway and Oxygen Therapy: Patient Spontanous Breathing and Patient connected to supplemental oxygen  Post-op Assessment: Post-op Vital signs reviewed, Patient's Cardiovascular Status Stable, Respiratory Function Stable, Patent Airway and No signs of Nausea or vomiting  Post-op Vital Signs: Reviewed and stable  Complications: No notable events documented.

## 2023-03-03 ENCOUNTER — Encounter: Payer: Self-pay | Admitting: Ophthalmology

## 2023-03-03 DIAGNOSIS — H2512 Age-related nuclear cataract, left eye: Secondary | ICD-10-CM | POA: Diagnosis not present

## 2023-03-03 NOTE — Anesthesia Preprocedure Evaluation (Signed)
Anesthesia Evaluation    Airway        Dental   Pulmonary           Cardiovascular hypertension,   11-19-20 1. Left ventricular ejection fraction, by estimation, is 60 to 65%. The  left ventricle has normal function. The left ventricle has no regional  wall motion abnormalities. There is mild left ventricular hypertrophy.  Left ventricular diastolic parameters  are consistent with Grade I diastolic dysfunction (impaired relaxation).  The average left ventricular global longitudinal strain is -20.7 %. The  global longitudinal strain is normal.   2. Right ventricular systolic function is normal. The right ventricular  size is normal. There is normal pulmonary artery systolic pressure. The  estimated right ventricular systolic pressure is 20.5 mmHg.   3. The mitral valve is normal in structure. Mild mitral valve  regurgitation.   4. The aortic valve is normal in structure. Aortic valve regurgitation is  mild.   5. The inferior vena cava is normal in size with greater than 50%  respiratory variability, suggesting right atrial pressure of 3 mmHg.  There's an EKG, but  I can't see it, just that there was one done.   Hx inferior MI    Neuro/Psych    GI/Hepatic   Endo/Other    Renal/GU      Musculoskeletal   Abdominal   Peds  Hematology   Anesthesia Other Findings Previous cataract surgery 03-02-23 Dr. Juel Burrow and [place] CRNA patient received versed 2 mg IV  History of umbilical hernia repair  GERD (gastroesophageal reflux disease) Kidney stones  Coronary artery disease Chronic kidney disease, stage III (moderate) Inferior MI  Hyperlipidemia Hypertension RBBB  Diastolic dysfunction Skin cancer  Bradycardia Gout  Hepatic cyst BPH (benign prostatic hyperplasia) Dyshidrotic eczema Prediabetes  Aortic atherosclerosis (HCC) Sigmoid diverticulosis  Acontractile bladder Atherosclerotic heart disease of native  coronary artery with unstable angina pectoris History of heart artery stent Wears dentures  Motion sickness Post herpetic neuralgia    GERD (gastroesophageal reflux disease) Kidney stonesCoronary artery disease Chronic kidney disease, stage III (moderate)  Inferior MI Feb 2013, treated w/BMS Hyperlipidemia             Skin cancer             Bradycardia Gout             Hepatic cyst Prediabetes             Aortic atherosclerosis  Sigmoid diverticulosis   Mild mitral regurg Mild aortic regurg Grade I diastolic dysfunction   03-29-22 cystosPast Surgical History: 2013: COLONOSCOPY 2013: CORONARY ANGIOPLASTY WITH STENT PLACEMENT   Reproductive/Obstetrics                              Anesthesia Physical Anesthesia Plan Anesthesia Quick Evaluation

## 2023-03-05 ENCOUNTER — Other Ambulatory Visit: Payer: Self-pay | Admitting: Family Medicine

## 2023-03-06 ENCOUNTER — Encounter: Payer: Self-pay | Admitting: Ophthalmology

## 2023-03-10 ENCOUNTER — Encounter: Payer: Self-pay | Admitting: Ophthalmology

## 2023-03-10 IMAGING — MR MR PROSTATE WO/W CM
56 series · 56 of 56 positions shown · IV contrast (9ml Gadavist)
Comparison: None.

CLINICAL DATA: Elevated PSA.

EXAM:
MR PROSTATE WITHOUT AND WITH CONTRAST
TECHNIQUE: Multiplanar multisequence MRI images were obtained of the pelvis
centered about the prostate. Pre and post contrast images were
obtained.
CONTRAST:  9mL GADAVIST GADOBUTROL 1 MMOL/ML IV SOLN

[Series 3: ax in&out whole · axial · 6.0mm · 0.74mm/px · 1 of 35 slices shown (1 of 2)]
[im 1/35]
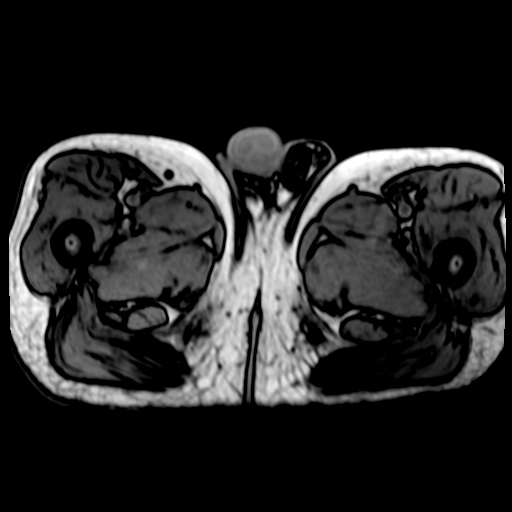

[Series 3: ax in&out whole · axial · 6.0mm · 0.74mm/px · 1 of 35 slices shown (2 of 2)]
[im 1/35]
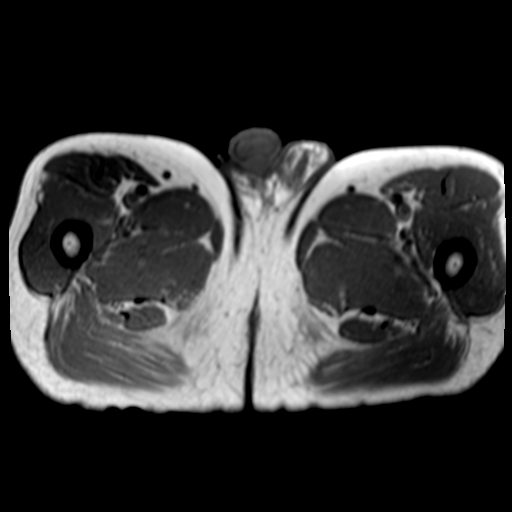

[Series 4: T2 · coronal · 3.0mm · 0.70mm/px · 1 of 35 slices shown (1 of 3)]
[im 1/35]
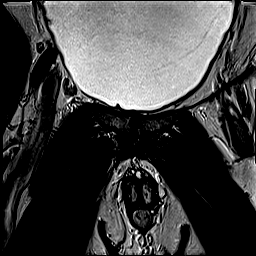

[Series 5: T2 · axial · 3.0mm · 0.56mm/px · 1 of 27 slices shown (2 of 3)]
[im 1/27]
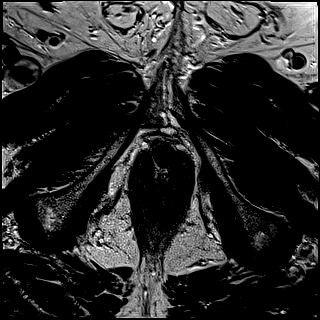

[Series 6: DWI · axial · 3.0mm · 0.86mm/px · 1 of 75 slices shown (1 of 3)]
[im 1/75]
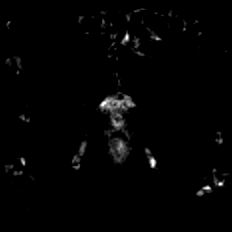

[Series 7: DWI · axial · 3.0mm · 0.86mm/px · 1 of 25 slices shown (2 of 3)]
[im 1/25]
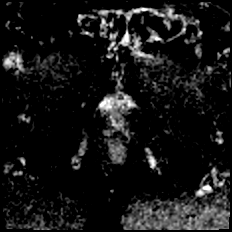

[Series 8: DWI · axial · 3.0mm · 0.86mm/px · 1 of 25 slices shown (3 of 3)]
[im 1/25]
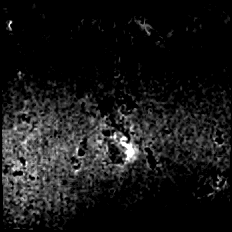

[Series 9: T2 · axial · 1.0mm · 1.04mm/px · 1 of 80 slices shown (3 of 3)]
[im 1/80]
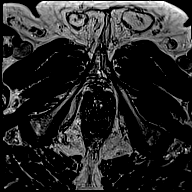

[Series 10: T1 · axial · 3.0mm · 1.15mm/px · 1 of 30 slices shown (1 of 48)]
[im 1/30]
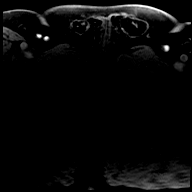

[Series 11: T1 · axial · 3.0mm · 1.15mm/px · 1 of 30 slices shown (2 of 48)]
[im 1/30]
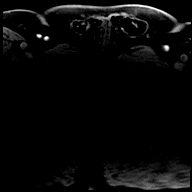

[Series 12: T1 · axial · 3.0mm · 1.15mm/px · 1 of 30 slices shown (3 of 48)]
[im 1/30]
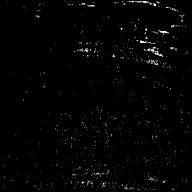

[Series 13: T1 · axial · 3.0mm · 1.15mm/px · 1 of 30 slices shown (4 of 48)]
[im 1/30]
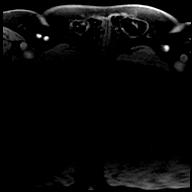

[Series 14: T1 · axial · 3.0mm · 1.15mm/px · 1 of 30 slices shown (5 of 48)]
[im 1/30]
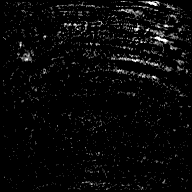

[Series 15: T1 · axial · 3.0mm · 1.15mm/px · 1 of 30 slices shown (6 of 48)]
[im 1/30]
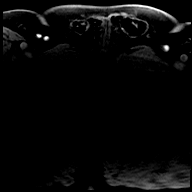

[Series 16: T1 · axial · 3.0mm · 1.15mm/px · 1 of 30 slices shown (7 of 48)]
[im 1/30]
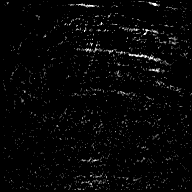

[Series 17: T1 · axial · 3.0mm · 1.15mm/px · 1 of 30 slices shown (8 of 48)]
[im 1/30]
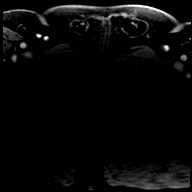

[Series 18: T1 · axial · 3.0mm · 1.15mm/px · 1 of 30 slices shown (9 of 48)]
[im 1/30]
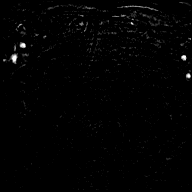

[Series 19: T1 · axial · 3.0mm · 1.15mm/px · 1 of 30 slices shown (10 of 48)]
[im 1/30]
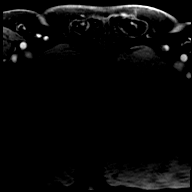

[Series 20: T1 · axial · 3.0mm · 1.15mm/px · 1 of 30 slices shown (11 of 48)]
[im 1/30]
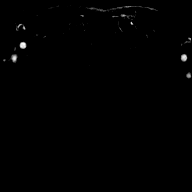

[Series 21: T1 · axial · 3.0mm · 1.15mm/px · 1 of 30 slices shown (12 of 48)]
[im 1/30]
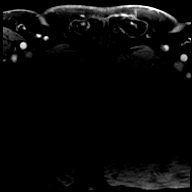

[Series 22: T1 · axial · 3.0mm · 1.15mm/px · 1 of 30 slices shown (13 of 48)]
[im 1/30]
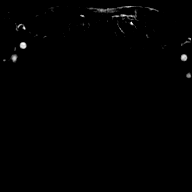

[Series 23: T1 · axial · 3.0mm · 1.15mm/px · 1 of 30 slices shown (14 of 48)]
[im 1/30]
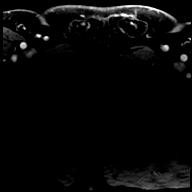

[Series 24: T1 · axial · 3.0mm · 1.15mm/px · 1 of 30 slices shown (15 of 48)]
[im 1/30]
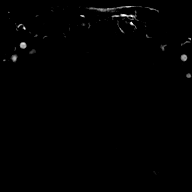

[Series 25: T1 · axial · 3.0mm · 1.15mm/px · 1 of 30 slices shown (16 of 48)]
[im 1/30]
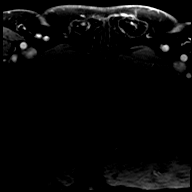

[Series 26: T1 · axial · 3.0mm · 1.15mm/px · 1 of 30 slices shown (17 of 48)]
[im 1/30]
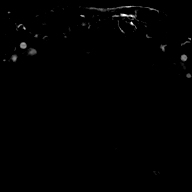

[Series 27: T1 · axial · 3.0mm · 1.15mm/px · 1 of 30 slices shown (18 of 48)]
[im 1/30]
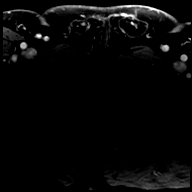

[Series 28: T1 · axial · 3.0mm · 1.15mm/px · 1 of 30 slices shown (19 of 48)]
[im 1/30]
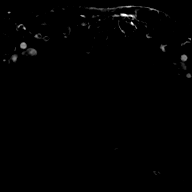

[Series 29: T1 · axial · 3.0mm · 1.15mm/px · 1 of 30 slices shown (20 of 48)]
[im 1/30]
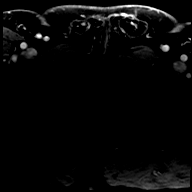

[Series 30: T1 · axial · 3.0mm · 1.15mm/px · 1 of 30 slices shown (21 of 48)]
[im 1/30]
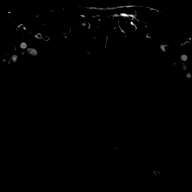

[Series 31: T1 · axial · 3.0mm · 1.15mm/px · 1 of 30 slices shown (22 of 48)]
[im 1/30]
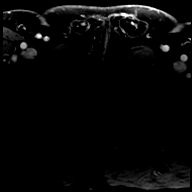

[Series 32: T1 · axial · 3.0mm · 1.15mm/px · 1 of 30 slices shown (23 of 48)]
[im 1/30]
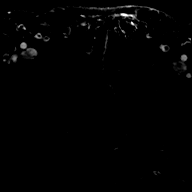

[Series 33: T1 · axial · 3.0mm · 1.15mm/px · 1 of 30 slices shown (24 of 48)]
[im 1/30]
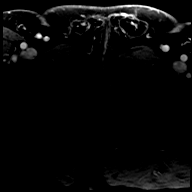

[Series 34: T1 · axial · 3.0mm · 1.15mm/px · 1 of 30 slices shown (25 of 48)]
[im 1/30]
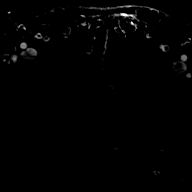

[Series 35: T1 · axial · 3.0mm · 1.15mm/px · 1 of 30 slices shown (26 of 48)]
[im 1/30]
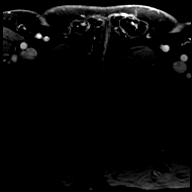

[Series 36: T1 · axial · 3.0mm · 1.15mm/px · 1 of 30 slices shown (27 of 48)]
[im 1/30]
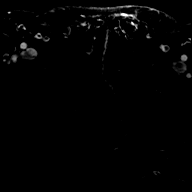

[Series 37: T1 · axial · 3.0mm · 1.15mm/px · 1 of 30 slices shown (28 of 48)]
[im 1/30]
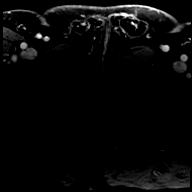

[Series 38: T1 · axial · 3.0mm · 1.15mm/px · 1 of 30 slices shown (29 of 48)]
[im 1/30]
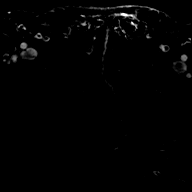

[Series 39: T1 · axial · 3.0mm · 1.15mm/px · 1 of 30 slices shown (30 of 48)]
[im 1/30]
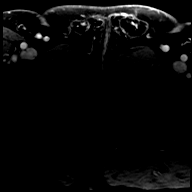

[Series 40: T1 · axial · 3.0mm · 1.15mm/px · 1 of 30 slices shown (31 of 48)]
[im 1/30]
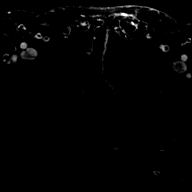

[Series 41: T1 · axial · 3.0mm · 1.15mm/px · 1 of 30 slices shown (32 of 48)]
[im 1/30]
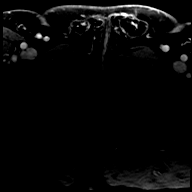

[Series 42: T1 · axial · 3.0mm · 1.15mm/px · 1 of 30 slices shown (33 of 48)]
[im 1/30]
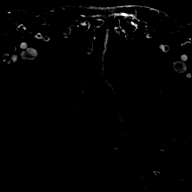

[Series 43: T1 · axial · 3.0mm · 1.15mm/px · 1 of 30 slices shown (34 of 48)]
[im 1/30]
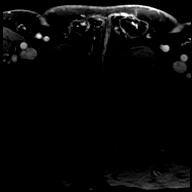

[Series 44: T1 · axial · 3.0mm · 1.15mm/px · 1 of 30 slices shown (35 of 48)]
[im 1/30]
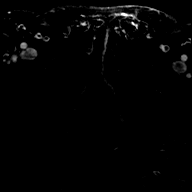

[Series 45: T1 · axial · 3.0mm · 1.15mm/px · 1 of 30 slices shown (36 of 48)]
[im 1/30]
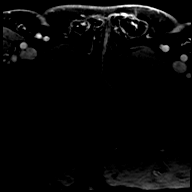

[Series 46: T1 · axial · 3.0mm · 1.15mm/px · 1 of 30 slices shown (37 of 48)]
[im 1/30]
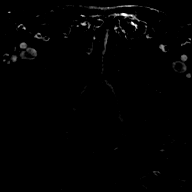

[Series 47: T1 · axial · 3.0mm · 1.15mm/px · 1 of 30 slices shown (38 of 48)]
[im 1/30]
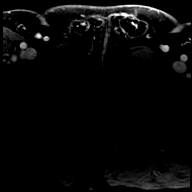

[Series 48: T1 · axial · 3.0mm · 1.15mm/px · 1 of 30 slices shown (39 of 48)]
[im 1/30]
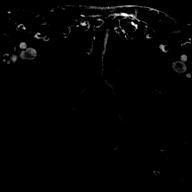

[Series 49: T1 · axial · 3.0mm · 1.15mm/px · 1 of 30 slices shown (40 of 48)]
[im 1/30]
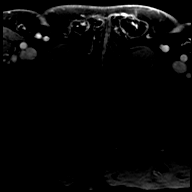

[Series 50: T1 · axial · 3.0mm · 1.15mm/px · 1 of 30 slices shown (41 of 48)]
[im 1/30]
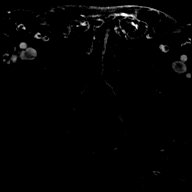

[Series 51: T1 · axial · 3.0mm · 1.15mm/px · 1 of 30 slices shown (42 of 48)]
[im 1/30]
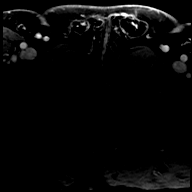

[Series 52: T1 · axial · 3.0mm · 1.15mm/px · 1 of 30 slices shown (43 of 48)]
[im 1/30]
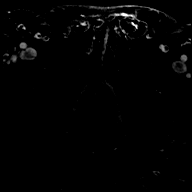

[Series 53: T1 · axial · 3.0mm · 1.15mm/px · 1 of 30 slices shown (44 of 48)]
[im 1/30]
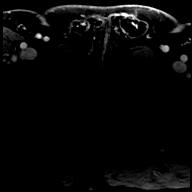

[Series 54: T1 · axial · 3.0mm · 1.15mm/px · 1 of 30 slices shown (45 of 48)]
[im 1/30]
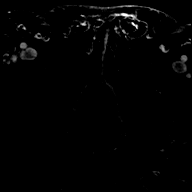

[Series 55: T1 · axial · 3.0mm · 1.15mm/px · 1 of 30 slices shown (46 of 48)]
[im 1/30]
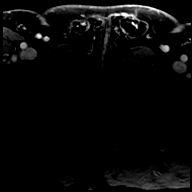

[Series 56: T1 · axial · 3.0mm · 1.15mm/px · 1 of 30 slices shown (47 of 48)]
[im 1/30]
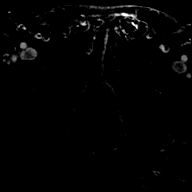

[Series 57: T1 · axial · 3.0mm · 1.15mm/px · 1 of 30 slices shown (48 of 48)]
[im 1/30]
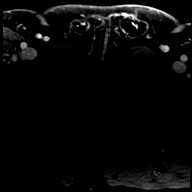

[56 of 56 positions shown; findings below may reference images not displayed]

FINDINGS: Prostate:

-- Peripheral Zone: Wedge shaped and ill-defined hypointensities are
noted diffusely on ADC; however, no focal ADC hypointense or high
b-value DWI hyperintense nodules are identified.

-- Transition/Central Zone: Mildly enlarged with involvement by BPH
nodules. No nodules with suspicious characteristics on T2-weighted
or diffusion imaging.

-- Measurements/Volume:  4.4 by 4.6 x 4.6 cm (volume = 49 cm^3)

Transcapsular spread:  Absent

Seminal vesicle involvement:  Absent

Neurovascular bundle involvement:  Absent

Pelvic adenopathy: None visualized

Bone metastasis: None visualized

Other: Distended urinary bladder with mild wall thickening and
trabeculation, as well as bilateral Ferienhaus diverticula, consistent
with chronic bladder outlet obstruction.
IMPRESSION: No radiographic evidence of high-grade prostate carcinoma. PI-RADS 2
(v.2.1): Low (clinically significant cancer unlikely)

## 2023-03-14 NOTE — Discharge Instructions (Signed)

## 2023-03-16 ENCOUNTER — Ambulatory Visit: Payer: PPO | Admitting: Anesthesiology

## 2023-03-16 ENCOUNTER — Encounter
Admission: RE | Disposition: A | Discharge status: Home / Self Care | Payer: Self-pay | Source: Home / Self Care | Attending: Ophthalmology

## 2023-03-16 ENCOUNTER — Other Ambulatory Visit: Payer: Self-pay

## 2023-03-16 ENCOUNTER — Encounter: Payer: Self-pay | Admitting: Ophthalmology

## 2023-03-16 ENCOUNTER — Ambulatory Visit
Admission: RE | Admit: 2023-03-16 | Discharge: 2023-03-16 | Disposition: A | Discharge status: Home / Self Care | Payer: PPO | Attending: Ophthalmology | Admitting: Ophthalmology

## 2023-03-16 DIAGNOSIS — I252 Old myocardial infarction: Secondary | ICD-10-CM | POA: Insufficient documentation

## 2023-03-16 DIAGNOSIS — H25042 Posterior subcapsular polar age-related cataract, left eye: Secondary | ICD-10-CM | POA: Diagnosis not present

## 2023-03-16 DIAGNOSIS — I25110 Atherosclerotic heart disease of native coronary artery with unstable angina pectoris: Secondary | ICD-10-CM | POA: Insufficient documentation

## 2023-03-16 DIAGNOSIS — H2512 Age-related nuclear cataract, left eye: Secondary | ICD-10-CM | POA: Insufficient documentation

## 2023-03-16 DIAGNOSIS — I129 Hypertensive chronic kidney disease with stage 1 through stage 4 chronic kidney disease, or unspecified chronic kidney disease: Secondary | ICD-10-CM | POA: Insufficient documentation

## 2023-03-16 DIAGNOSIS — N183 Chronic kidney disease, stage 3 unspecified: Secondary | ICD-10-CM | POA: Diagnosis not present

## 2023-03-16 DIAGNOSIS — N1831 Chronic kidney disease, stage 3a: Secondary | ICD-10-CM | POA: Diagnosis not present

## 2023-03-16 DIAGNOSIS — H25812 Combined forms of age-related cataract, left eye: Secondary | ICD-10-CM | POA: Diagnosis not present

## 2023-03-16 HISTORY — DX: Nonrheumatic mitral (valve) insufficiency: I34.0

## 2023-03-16 HISTORY — DX: Nonrheumatic aortic (valve) insufficiency: I35.1

## 2023-03-16 HISTORY — DX: Other ill-defined heart diseases: I51.89

## 2023-03-16 HISTORY — PX: CATARACT EXTRACTION W/PHACO: SHX586

## 2023-03-16 SURGERY — PHACOEMULSIFICATION, CATARACT, WITH IOL INSERTION
Anesthesia: Monitor Anesthesia Care | Site: Eye | Laterality: Left

## 2023-03-16 MED ORDER — LIDOCAINE HCL (PF) 2 % IJ SOLN
INTRAOCULAR | Status: DC | PRN
  Administered 2023-03-16: 1 mL via INTRAOCULAR

## 2023-03-16 MED ORDER — SIGHTPATH DOSE#1 BSS IO SOLN
INTRAOCULAR | Status: DC | PRN
  Administered 2023-03-16: 15 mL

## 2023-03-16 MED ORDER — SIGHTPATH DOSE#1 BSS IO SOLN
INTRAOCULAR | Status: DC | PRN
  Administered 2023-03-16: 72 mL via OPHTHALMIC

## 2023-03-16 MED ORDER — MIDAZOLAM HCL 2 MG/2ML IJ SOLN
INTRAMUSCULAR | Status: DC | PRN
  Administered 2023-03-16: 2 mg via INTRAVENOUS

## 2023-03-16 MED ORDER — ARMC OPHTHALMIC DILATING DROPS
OPHTHALMIC | Status: AC
  Filled 2023-03-16: qty 0.5

## 2023-03-16 MED ORDER — MOXIFLOXACIN HCL 0.5 % OP SOLN
OPHTHALMIC | Status: DC | PRN
  Administered 2023-03-16: .2 mL via OPHTHALMIC

## 2023-03-16 MED ORDER — TETRACAINE HCL 0.5 % OP SOLN
1.0000 [drp] | OPHTHALMIC | Status: DC | PRN
  Administered 2023-03-16 (×3): 1 [drp] via OPHTHALMIC

## 2023-03-16 MED ORDER — TETRACAINE HCL 0.5 % OP SOLN
OPHTHALMIC | Status: AC
Start: 2023-03-16 — End: ?
  Filled 2023-03-16: qty 4

## 2023-03-16 MED ORDER — FENTANYL CITRATE (PF) 100 MCG/2ML IJ SOLN
INTRAMUSCULAR | Status: AC
  Filled 2023-03-16: qty 2

## 2023-03-16 MED ORDER — MIDAZOLAM HCL 2 MG/2ML IJ SOLN
INTRAMUSCULAR | Status: AC
Start: 2023-03-16 — End: ?
  Filled 2023-03-16: qty 2

## 2023-03-16 MED ORDER — BRIMONIDINE TARTRATE-TIMOLOL 0.2-0.5 % OP SOLN
OPHTHALMIC | Status: DC | PRN
  Administered 2023-03-16: 1 [drp] via OPHTHALMIC

## 2023-03-16 MED ORDER — SIGHTPATH DOSE#1 NA HYALUR & NA CHOND-NA HYALUR IO KIT
PACK | INTRAOCULAR | Status: DC | PRN
  Administered 2023-03-16: 1 via OPHTHALMIC

## 2023-03-16 MED ORDER — ARMC OPHTHALMIC DILATING DROPS
1.0000 | OPHTHALMIC | Status: DC | PRN
  Administered 2023-03-16 (×3): 1 via OPHTHALMIC

## 2023-03-16 SURGICAL SUPPLY — 22 items
ALCON CLAREON IOL 13.5 (Intraocular Lens) ×1 IMPLANT
BNDG EYE OVAL 2 1/8 X 2 5/8 (GAUZE/BANDAGES/DRESSINGS) IMPLANT
CANNULA ANT/CHMB 27G (MISCELLANEOUS) IMPLANT
CANNULA ANT/CHMB 27GA (MISCELLANEOUS)
CATARACT SUITE SIGHTPATH (MISCELLANEOUS) ×1
DISSECTOR HYDRO NUCLEUS 50X22 (MISCELLANEOUS) ×1 IMPLANT
DRSG TEGADERM 2-3/8X2-3/4 SM (GAUZE/BANDAGES/DRESSINGS) ×1 IMPLANT
FEE CATARACT SUITE SIGHTPATH (MISCELLANEOUS) ×1 IMPLANT
GLOVE SURG SYN 7.5 E (GLOVE) ×1
GLOVE SURG SYN 7.5 PF PI (GLOVE) ×1 IMPLANT
GLOVE SURG SYN 8.5 E (GLOVE) ×1
GLOVE SURG SYN 8.5 PF PI (GLOVE) ×1 IMPLANT
LENS IOL CLRN 13.5 (Intraocular Lens) IMPLANT
LENS MONOFOCAL CLAREON 13.5 (Intraocular Lens) ×1 IMPLANT
NDL FILTER BLUNT 18X1 1/2 (NEEDLE) IMPLANT
NEEDLE FILTER BLUNT 18X1 1/2 (NEEDLE)
PACK VIT ANT 23G (MISCELLANEOUS) IMPLANT
RING MALYGIN 7.0 (MISCELLANEOUS) IMPLANT
SUT ETHILON 10-0 CS-B-6CS-B-6 (SUTURE)
SUTURE EHLN 10-0 CS-B-6CS-B-6 (SUTURE) IMPLANT
SYR 3ML LL SCALE MARK (SYRINGE) IMPLANT
SYR 5ML LL (SYRINGE) IMPLANT

## 2023-03-16 NOTE — Transfer of Care (Signed)
 Immediate Anesthesia Transfer of Care Note  Patient: Kevin Melton  Procedure(s) Performed: CATARACT EXTRACTION PHACO AND INTRAOCULAR LENS PLACEMENT (IOC) LEFT MALYUGIN OMIDRIA  (Left: Eye)  Patient Location: PACU  Anesthesia Type: MAC  Level of Consciousness: awake, alert  and patient cooperative  Airway and Oxygen Therapy: Patient Spontanous Breathing and Patient connected to supplemental oxygen  Post-op Assessment: Post-op Vital signs reviewed, Patient's Cardiovascular Status Stable, Respiratory Function Stable, Patent Airway and No signs of Nausea or vomiting  Post-op Vital Signs: Reviewed and stable  Complications: No notable events documented.

## 2023-03-16 NOTE — Op Note (Signed)
 OPERATIVE NOTE  Jeri Rawlins Latona [id number] 03/16/2023   PREOPERATIVE DIAGNOSIS: Nuclear sclerotic cataract left eye. H25.12   POSTOPERATIVE DIAGNOSIS: Nuclear sclerotic cataract left eye. H25.12   PROCEDURE:  Phacoemusification with posterior chamber intraocular lens placement of the left eye  Ultrasound time: Procedure(s) with comments: CATARACT EXTRACTION PHACO AND INTRAOCULAR LENS PLACEMENT (IOC) LEFT MALYUGIN OMIDRIA  (Left) - 7.84 0:45.6  LENS:   Implant Name Type Inv. Item Serial No. Manufacturer Lot No. LRB No. Used Action  ALCON CLAREON IOL 13.5 D Intraocular Lens  [id number] ALCON  Left 1 Implanted      SURGEON:  Feliciano HERO. [place], MD   ANESTHESIA:  Topical with tetracaine  drops, augmented with 1% preservative-free intracameral lidocaine .   COMPLICATIONS:  None.   DESCRIPTION OF PROCEDURE:  The patient was identified in the holding room and transported to the operating room and placed in the supine position under the operating microscope.  The left eye was identified as the operative eye, which was prepped and draped in the usual sterile ophthalmic fashion.   A 1 millimeter clear-corneal paracentesis was made inferotemporally. Preservative-free 1% lidocaine  mixed with 1:1,000 bisulfite-free aqueous solution of epinephrine  was injected into the anterior chamber. The anterior chamber was then filled with Viscoat viscoelastic. A 2.4 millimeter keratome was used to make a clear-corneal incision superotemporally. A curvilinear capsulorrhexis was made with a cystotome and capsulorrhexis forceps. Balanced salt  solution was used to hydrodissect and hydrodelineate the nucleus. Phacoemulsification was then used to remove the lens nucleus and epinucleus. The remaining cortex was then removed using the irrigation and aspiration handpiece. Provisc was then placed into the capsular bag to distend it for lens placement. An +13.50 D [id number] intraocular lens was then injected into the capsular  bag. The remaining viscoelastic was aspirated.   Wounds were hydrated with balanced salt  solution.  The anterior chamber was inflated to a physiologic pressure with balanced salt  solution.  No wound leaks were noted. Vigamox  was injected intracamerally.  Timolol  and Brimonidine  drops were applied to the eye.  The patient was taken to the recovery room in stable condition without complications of anesthesia or surgery.  Feliciano Hugger [place] 03/16/2023, 9:29 AM

## 2023-03-16 NOTE — Anesthesia Postprocedure Evaluation (Signed)
 Anesthesia Post Note  Patient: Kevin Melton  Procedure(s) Performed: CATARACT EXTRACTION PHACO AND INTRAOCULAR LENS PLACEMENT (IOC) LEFT MALYUGIN OMIDRIA  (Left: Eye)  Patient location during evaluation: PACU Anesthesia Type: MAC Level of consciousness: awake and alert Pain management: pain level controlled Vital Signs Assessment: post-procedure vital signs reviewed and stable Respiratory status: spontaneous breathing, nonlabored ventilation, respiratory function stable and patient connected to nasal cannula oxygen Cardiovascular status: stable and blood pressure returned to baseline Postop Assessment: no apparent nausea or vomiting Anesthetic complications: no   No notable events documented.   Last Vitals:  Vitals:   03/16/23 0930 03/16/23 0934  BP: 120/70 117/63  Pulse: (!) 53 (!) 52  Resp: 14 11  Temp: (!) 36.3 C (!) 36.3 C  SpO2: 97% 96%    Last Pain:  Vitals:   03/16/23 0934  TempSrc:   PainSc: 0-No pain                 Quenna Doepke C Khaila Velarde

## 2023-03-16 NOTE — H&P (Signed)
 [facility]   Primary Care Physician:  Myrla Jon HERO, MD Ophthalmologist: Dr. Feliciano Ober  Pre-Procedure History & Physical: HPI:  Blayde Bacigalupi Houseworth is a 73 y.o. male here for cataract surgery.   Past Medical History:  Diagnosis Date   Acontractile bladder 06/17/2021   a.) secondary to BPH/detrusor hypotonicity; b.) requires self CIC 4x/day   Aortic atherosclerosis (HCC)    Atherosclerotic heart disease of native coronary artery with unstable angina pectoris (HCC)    BPH (benign prostatic hyperplasia)    Bradycardia    Chronic kidney disease, stage III (moderate) (HCC)    Coronary artery disease    a. 2013 Inf MI/PCI: BMS to RCA; b. 11/2013 MV: small reversible apical anteroseptal defect/ischemia. EF 60%; c. 11/2020 MV: small, fixed basal inf defect - scar vs artifact. No ischemia. EF 45-50% (60-65% by echo).   Diastolic dysfunction    a.) TTE 11/19/2020: EF 60-65%, mild LVH, mild MR/AR, G1DD   Dyshidrotic eczema    GERD (gastroesophageal reflux disease)    Gout    Grade I diastolic dysfunction    Hepatic cyst    History of heart artery stent    History of umbilical hernia repair    Hyperlipidemia    Hypertension    Inferior MI (HCC) 04/2011   a.) MI 04/2011 --> treated in [place] --> BMS (unknown type) to RCA   Kidney stones    Mild aortic regurgitation    Mild mitral regurgitation by prior echocardiogram    Motion sickness    Boats and circular motion   Post herpetic neuralgia    Prediabetes    RBBB    Sigmoid diverticulosis    Skin cancer    Wears dentures    Has full upper and lower.  Only wears upper    Past Surgical History:  Procedure Laterality Date   CATARACT EXTRACTION W/PHACO Right 03/02/2023   Procedure: CATARACT EXTRACTION PHACO AND INTRAOCULAR LENS PLACEMENT (IOC) RIGHT MALYUGIN OMIDRIA  9.37 00:55.6;  Surgeon: Ober Feliciano Hugger, MD;  Location: [facility];  Service: Ophthalmology;  Laterality: Right;   COLONOSCOPY   2013   CORONARY ANGIOPLASTY WITH STENT PLACEMENT  2013   [place]   CYSTOSCOPY/URETEROSCOPY/HOLMIUM LASER/STENT PLACEMENT Right 03/29/2022   Procedure: CYSTOSCOPY/URETEROSCOPY/HOLMIUM LASER/STENT PLACEMENT/BILATERAL RETROGRADE [id number];  Surgeon: Twylla Glendia BROCKS, MD;  Location: [facility];  Service: Urology;  Laterality: Right;   ESOPHAGOGASTRODUODENOSCOPY N/A 12/08/2021   Procedure: ESOPHAGOGASTRODUODENOSCOPY (EGD);  Surgeon: Toledo, Ladell POUR, MD;  Location: [facility];  Service: Gastroenterology;  Laterality: N/A;   ESOPHAGOGASTRODUODENOSCOPY (EGD) WITH PROPOFOL  N/A 01/05/2017   Procedure: ESOPHAGOGASTRODUODENOSCOPY (EGD) WITH PROPOFOL  With Dilation;  Surgeon: Unk Corinn Skiff, MD;  Location: [facility];  Service: Gastroenterology;  Laterality: N/A;   ESOPHAGOGASTRODUODENOSCOPY (EGD) WITH PROPOFOL  N/A 07/03/2019   Procedure: ESOPHAGOGASTRODUODENOSCOPY (EGD) WITH PROPOFOL ;  Surgeon: Unk Corinn Skiff, MD;  Location: [facility];  Service: Gastroenterology;  Laterality: N/A;   ESOPHAGOGASTRODUODENOSCOPY (EGD) WITH PROPOFOL  N/A 11/04/2019   Procedure: ESOPHAGOGASTRODUODENOSCOPY (EGD) WITH PROPOFOL ;  Surgeon: Unk Corinn Skiff, MD;  Location: [facility];  Service: Gastroenterology;  Laterality: N/A;   HERNIA REPAIR     LITHOTRIPSY  2014   UMBILICAL HERNIA REPAIR N/A 02/20/2017   Primary repair 2 cm umbilical hernia;  Surgeon: Dessa Reyes ORN, MD;  Location: [facility];  Service: General;  Laterality: N/A;   URETEROSCOPY Left 03/29/2022   Procedure: DIAGNOSTIC URETEROSCOPY/LEFT STENT PLACEMENT;  Surgeon: Twylla Glendia BROCKS, MD;  Location: [facility];  Service: Urology;  Laterality: Left;  Prior to Admission medications   Medication Sig Start Date End Date Taking? Authorizing Provider  amLODipine  (NORVASC ) 10 MG tablet Take 1 tablet (10 mg total) by mouth daily. 12/08/22  Yes Furth, Cadence H, PA-C  aspirin  81 MG chewable tablet Chew 1 tablet (81 mg total) by mouth at bedtime.  12/08/22  Yes Furth, Cadence H, PA-C  atorvastatin  (LIPITOR) 40 MG tablet Take 1 tablet (40 mg total) by mouth daily. 12/08/22  Yes Furth, Cadence H, PA-C  finasteride  (PROSCAR ) 5 MG tablet TAKE 1 TABLET (5 MG TOTAL) BY MOUTH DAILY. 08/11/22  Yes Stoioff, Glendia BROCKS, MD  nitroGLYCERIN  (NITROSTAT ) 0.4 MG SL tablet Place 1 tablet (0.4 mg total) under the tongue every 5 (five) minutes as needed for chest pain. 11/03/21  Yes Vivienne Lonni Ingle, NP  allopurinol  (ZYLOPRIM ) 300 MG tablet TAKE 1 TABLET BY MOUTH EVERYDAY AT BEDTIME 03/07/23   Bacigalupo, Jon HERO, MD  benazepril  (LOTENSIN ) 40 MG tablet TAKE 1 TABLET BY MOUTH EVERY DAY 03/07/23   Bacigalupo, Jon HERO, MD  gabapentin  (NEURONTIN ) 300 MG capsule Take 1 capsule (300 mg total) by mouth 2 (two) times daily AND 2 capsules (600 mg total) at bedtime. Patient not taking: Reported on 02/20/2023 09/01/22   Bacigalupo, Angela M, MD  oxybutynin  (DITROPAN ) 5 MG tablet Take 1 tablet (5 mg total) by mouth every 8 (eight) hours as needed for bladder spasms. Patient not taking: Reported on 02/20/2023 03/11/22   Vaillancourt, Samantha, PA-C    Allergies as of 02/01/2023   (No Known Allergies)    Family History  Problem Relation Age of Onset   Dementia Mother 57   Lung disease Father 8       collapsed lung   Healthy Sister    Healthy Brother    Heart Problems Brother    Healthy Son    Healthy Sister    Colon cancer Neg Hx    Prostate cancer Neg Hx    Heart disease Neg Hx     Social History   Socioeconomic History   Marital status: Married    Spouse name: Leeroy   Number of children: 1   Years of education: 12   Highest education level: High school graduate  Occupational History   Occupation: Self Employed    Comment: [employer]    Comment: semi-retired  Tobacco Use   Smoking status: Never   Smokeless tobacco: Never  Vaping Use   Vaping status: Never Used  Substance and Sexual Activity   Alcohol use: No   Drug use:  No   Sexual activity: Yes    Partners: Female  Other Topics Concern   Not on file  Social History Narrative   Not on file   Social Drivers of Health   Financial Resource Strain: Low Risk  (05/04/2022)   Overall Financial Resource Strain (CARDIA)    Difficulty of Paying Living Expenses: Not hard at all  Food Insecurity: No Food Insecurity (05/04/2022)   Hunger Vital Sign    Worried About Running Out of Food in the Last Year: Never true    Ran Out of Food in the Last Year: Never true  Transportation Needs: No Transportation Needs (05/04/2022)   PRAPARE - Administrator, Civil Service (Medical): No    Lack of Transportation (Non-Medical): No  Physical Activity: Inactive (05/04/2022)   Exercise Vital Sign    Days of Exercise per Week: 0 days    Minutes of Exercise per Session: 0  min  Stress: No Stress Concern Present (05/04/2022)   [employer] of Occupational Health - Occupational Stress Questionnaire    Feeling of Stress : Not at all  Social Connections: Moderately Isolated (05/04/2022)   Social Connection and Isolation Panel [NHANES]    Frequency of Communication with Friends and Family: More than three times a week    Frequency of Social Gatherings with Friends and Family: Once a week    Attends Religious Services: Never    Database Administrator or Organizations: No    Attends Banker Meetings: Never    Marital Status: Married  Catering Manager Violence: Not At Risk (05/04/2022)   Humiliation, Afraid, Rape, and Kick questionnaire    Fear of Current or Ex-Partner: No    Emotionally Abused: No    Physically Abused: No    Sexually Abused: No    Review of Systems: See HPI, otherwise negative ROS  Physical Exam: There were no vitals taken for this visit. General:   Alert, cooperative in NAD Head:  Normocephalic and atraumatic. Respiratory:  Normal work of breathing. Cardiovascular:  RRR  Impression/Plan: Abdulraheem Pineo Fabre is here for  cataract surgery.  Risks, benefits, limitations, and alternatives regarding cataract surgery have been reviewed with the patient.  Questions have been answered.  All parties agreeable.   Feliciano Bryan Ober, MD  03/16/2023, 7:14 AM

## 2023-03-20 ENCOUNTER — Encounter: Payer: Self-pay | Admitting: Ophthalmology

## 2023-03-21 ENCOUNTER — Other Ambulatory Visit: Payer: Self-pay | Admitting: *Deleted

## 2023-03-21 DIAGNOSIS — N1330 Unspecified hydronephrosis: Secondary | ICD-10-CM

## 2023-03-21 NOTE — Progress Notes (Unsigned)
03/24/2023 2:27 PM   Kevin Melton [birth year] [id number]  Referring provider: Erasmo Downer, MD [address] [place],  [place] [address]  Urological history: 1.  Left hydronephrosis -Renal lasix scan (08/2022) - mild obstruction at the left UVJ; renal function is diminished, which according to the patient is long standing -serum creatinine (03/2023) - 1.27  2.  Hypotonic bladder -CIC x 4 w/ Coude straight caths  3.  Nephrolithiasis -right URS/LL/stent (03/2022)  No chief complaint on file.   HPI: Kevin Melton is a 73 y.o. male who presents today for 6 month follow up.   Previous records reviewed.     PMH: Past Medical History:  Diagnosis Date   Acontractile bladder 06/17/2021   a.) secondary to BPH/detrusor hypotonicity; b.) requires self CIC 4x/day   Aortic atherosclerosis (HCC)    Atherosclerotic heart disease of native coronary artery with unstable angina pectoris (HCC)    BPH (benign prostatic hyperplasia)    Bradycardia    Chronic kidney disease, stage III (moderate) (HCC)    Coronary artery disease    a. 2013 Inf MI/PCI: BMS to RCA; b. 11/2013 MV: small reversible apical anteroseptal defect/ischemia. EF 60%; c. 11/2020 MV: small, fixed basal inf defect - scar vs artifact. No ischemia. EF 45-50% (60-65% by echo).   Diastolic dysfunction    a.) TTE 11/19/2020: EF 60-65%, mild LVH, mild MR/AR, G1DD   Dyshidrotic eczema    GERD (gastroesophageal reflux disease)    Gout    Grade I diastolic dysfunction    Hepatic cyst    History of heart artery stent    History of umbilical hernia repair    Hyperlipidemia    Hypertension    Inferior MI (HCC) 04/2011   a.) MI 04/2011 --> treated in [place] --> BMS (unknown type) to RCA   Kidney stones    Mild aortic regurgitation    Mild mitral regurgitation by prior echocardiogram    Motion sickness    Boats and circular motion   Post herpetic neuralgia    Prediabetes    RBBB     Sigmoid diverticulosis    Skin cancer    Wears dentures    Has full upper and lower.  Only wears upper    Surgical History: Past Surgical History:  Procedure Laterality Date   CATARACT EXTRACTION W/PHACO Right 03/02/2023   Procedure: CATARACT EXTRACTION PHACO AND INTRAOCULAR LENS PLACEMENT (IOC) RIGHT MALYUGIN OMIDRIA 9.37 00:55.6;  Surgeon: Estanislado Pandy, MD;  Location: [facility];  Service: Ophthalmology;  Laterality: Right;   CATARACT EXTRACTION W/PHACO Left 03/16/2023   Procedure: CATARACT EXTRACTION PHACO AND INTRAOCULAR LENS PLACEMENT (IOC) LEFT MALYUGIN OMIDRIA;  Surgeon: Estanislado Pandy, MD;  Location: [facility];  Service: Ophthalmology;  Laterality: Left;  7.84 0:45.6   COLONOSCOPY  2013   CORONARY ANGIOPLASTY WITH STENT PLACEMENT  2013   [place]   CYSTOSCOPY/URETEROSCOPY/HOLMIUM LASER/STENT PLACEMENT Right 03/29/2022   Procedure: CYSTOSCOPY/URETEROSCOPY/HOLMIUM LASER/STENT PLACEMENT/BILATERAL RETROGRADE [id number];  Surgeon: Riki Altes, MD;  Location: [facility];  Service: Urology;  Laterality: Right;   ESOPHAGOGASTRODUODENOSCOPY N/A 12/08/2021   Procedure: ESOPHAGOGASTRODUODENOSCOPY (EGD);  Surgeon: Toledo, Boykin Nearing, MD;  Location: [facility];  Service: Gastroenterology;  Laterality: N/A;   ESOPHAGOGASTRODUODENOSCOPY (EGD) WITH PROPOFOL N/A 01/05/2017   Procedure: ESOPHAGOGASTRODUODENOSCOPY (EGD) WITH PROPOFOL With Dilation;  Surgeon: Toney Reil, MD;  Location: [facility];  Service: Gastroenterology;  Laterality: N/A;   ESOPHAGOGASTRODUODENOSCOPY (EGD) WITH PROPOFOL N/A 07/03/2019   Procedure: ESOPHAGOGASTRODUODENOSCOPY (  EGD) WITH PROPOFOL;  Surgeon: Toney Reil, MD;  Location: [facility];  Service: Gastroenterology;  Laterality: N/A;   ESOPHAGOGASTRODUODENOSCOPY (EGD) WITH PROPOFOL N/A 11/04/2019   Procedure: ESOPHAGOGASTRODUODENOSCOPY (EGD) WITH PROPOFOL;  Surgeon: Toney Reil, MD;  Location: [facility]  END[facility];  Service: Gastroenterology;  Laterality: N/A;   HERNIA REPAIR     LITHOTRIPSY  2014   UMBILICAL HERNIA REPAIR N/A 02/20/2017   Primary repair 2 cm umbilical hernia;  Surgeon: Earline Mayotte, MD;  Location: [facility];  Service: General;  Laterality: N/A;   URETEROSCOPY Left 03/29/2022   Procedure: DIAGNOSTIC URETEROSCOPY/LEFT STENT PLACEMENT;  Surgeon: Riki Altes, MD;  Location: [facility];  Service: Urology;  Laterality: Left;    Home Medications:  Allergies as of 03/24/2023   No Known Allergies      Medication List        Accurate as of March 21, 2023  2:27 PM. If you have any questions, ask your nurse or doctor.          allopurinol 300 MG tablet Commonly known as: ZYLOPRIM TAKE 1 TABLET BY MOUTH EVERYDAY AT BEDTIME   amLODipine 10 MG tablet Commonly known as: NORVASC Take 1 tablet (10 mg total) by mouth daily.   aspirin 81 MG chewable tablet Chew 1 tablet (81 mg total) by mouth at bedtime.   atorvastatin 40 MG tablet Commonly known as: LIPITOR Take 1 tablet (40 mg total) by mouth daily.   benazepril 40 MG tablet Commonly known as: LOTENSIN TAKE 1 TABLET BY MOUTH EVERY DAY   finasteride 5 MG tablet Commonly known as: PROSCAR TAKE 1 TABLET (5 MG TOTAL) BY MOUTH DAILY.   gabapentin 300 MG capsule Commonly known as: NEURONTIN Take 1 capsule (300 mg total) by mouth 2 (two) times daily AND 2 capsules (600 mg total) at bedtime.   nitroGLYCERIN 0.4 MG SL tablet Commonly known as: NITROSTAT Place 1 tablet (0.4 mg total) under the tongue every 5 (five) minutes as needed for chest pain.   oxybutynin 5 MG tablet Commonly known as: DITROPAN Take 1 tablet (5 mg total) by mouth every 8 (eight) hours as needed for bladder spasms.        Allergies: No Known Allergies  Family History: Family History  Problem Relation Age of Onset   Dementia Mother 52   Lung disease Father 50       collapsed lung   Healthy Sister    Healthy Brother    Heart  Problems Brother    Healthy Son    Healthy Sister    Colon cancer Neg Hx    Prostate cancer Neg Hx    Heart disease Neg Hx     Social History:  reports that he has never smoked. He has never used smokeless tobacco. He reports that he does not drink alcohol and does not use drugs.  ROS: Pertinent ROS in HPI  Physical Exam: There were no vitals taken for this visit.  Constitutional:  Well nourished. Alert and oriented, No acute distress. HEENT: [place] AT, moist mucus membranes.  Trachea midline, no masses. Cardiovascular: No clubbing, cyanosis, or edema. Respiratory: Normal respiratory effort, no increased work of breathing. GI: Abdomen is soft, non tender, non distended, no abdominal masses. Liver and spleen not palpable.  No hernias appreciated.  Stool sample for occult testing is not indicated.   GU: No CVA tenderness.  No bladder fullness or masses.  Patient with circumcised/uncircumcised phallus. ***Foreskin easily retracted***  Urethral meatus is patent.  No penile discharge. No penile lesions or rashes. Scrotum without lesions, cysts, rashes and/or edema.  Testicles are located scrotally bilaterally. No masses are appreciated in the testicles. Left and right epididymis are normal. Rectal: Patient with  normal sphincter tone. Anus and perineum without scarring or rashes. No rectal masses are appreciated. Prostate is approximately *** grams, *** nodules are appreciated. Seminal vesicles are normal. Skin: No rashes, bruises or suspicious lesions. Lymph: No cervical or inguinal adenopathy. Neurologic: Grossly intact, no focal deficits, moving all 4 extremities. Psychiatric: Normal mood and affect.  Laboratory Data: Lab Results  Component Value Date   WBC 8.8 09/01/2022   HGB 13.7 09/01/2022   HCT 41.7 09/01/2022   MCV 91 09/01/2022   PLT 285 09/01/2022    Lab Results  Component Value Date   CREATININE 1.27 01/02/2023    Lab Results  Component Value Date   HGBA1C 6.3 (H)  12/08/2022        Component Value Date/Time   CHOL 134 09/01/2022 1345   HDL 36 (L) 09/01/2022 1345   CHOLHDL 3.7 09/01/2022 1345   CHOLHDL 3.3 11/03/2021 1207   VLDL 23 11/03/2021 1207   LDLCALC 68 09/01/2022 1345   LDLCALC 90 11/10/2016 1630    Lab Results  Component Value Date   AST 14 09/01/2022   Lab Results  Component Value Date   ALT 13 09/01/2022   Urinalysis See EPIC and HPI  I have reviewed the labs.   Pertinent Imaging: N/A  Assessment & Plan:  ***  1.  Left hydronephrosis -Serum creatinine normal  -Considered by the patient to be longstanding -Defers balloon dilatation, ureteral reimplantation and would like to continue observation  2. Hypotonic bladder -continue CIC x 4 with coude catheters indefinitely  No follow-ups on file.  These notes generated with voice recognition software. I apologize for typographical errors.  Cloretta Ned  [facility] [address]  [address] [place], [place] [address] [phone]

## 2023-03-22 ENCOUNTER — Other Ambulatory Visit: Payer: PPO

## 2023-03-22 DIAGNOSIS — N1330 Unspecified hydronephrosis: Secondary | ICD-10-CM

## 2023-03-23 LAB — CREATININE, SERUM
Creatinine, Ser: 1.27 mg/dL (ref 0.76–1.27)
eGFR: 60 mL/min/{1.73_m2} (ref >59)

## 2023-03-24 ENCOUNTER — Ambulatory Visit: Payer: PPO | Admitting: Urology

## 2023-03-24 VITALS — BP 126/65 | HR 52 | Ht 69.0 in | Wt 190.0 lb

## 2023-03-24 DIAGNOSIS — N1330 Unspecified hydronephrosis: Secondary | ICD-10-CM

## 2023-03-24 DIAGNOSIS — N400 Enlarged prostate without lower urinary tract symptoms: Secondary | ICD-10-CM

## 2023-03-24 DIAGNOSIS — R972 Elevated prostate specific antigen [PSA]: Secondary | ICD-10-CM

## 2023-03-24 DIAGNOSIS — N312 Flaccid neuropathic bladder, not elsewhere classified: Secondary | ICD-10-CM

## 2023-03-24 DIAGNOSIS — R339 Retention of urine, unspecified: Secondary | ICD-10-CM

## 2023-03-26 ENCOUNTER — Encounter: Payer: Self-pay | Admitting: Urology

## 2023-04-05 DIAGNOSIS — Z961 Presence of intraocular lens: Secondary | ICD-10-CM | POA: Diagnosis not present

## 2023-05-01 DIAGNOSIS — R339 Retention of urine, unspecified: Secondary | ICD-10-CM | POA: Diagnosis not present

## 2023-05-09 ENCOUNTER — Ambulatory Visit: Payer: PPO

## 2023-05-09 VITALS — BP 128/72 | Ht 69.0 in | Wt 199.3 lb

## 2023-05-09 DIAGNOSIS — Z23 Encounter for immunization: Secondary | ICD-10-CM | POA: Diagnosis not present

## 2023-05-09 DIAGNOSIS — Z0000 Encounter for general adult medical examination without abnormal findings: Secondary | ICD-10-CM | POA: Diagnosis not present

## 2023-05-09 NOTE — Patient Instructions (Addendum)
 Mr. Both , Thank you for taking time to come for your Medicare Wellness Visit. I appreciate your ongoing commitment to your health goals. Please review the following plan we discussed and let me know if I can assist you in the future.   Referrals/Orders/Follow-Ups/Clinician Recommendations: 2ND SHINGRIX GIVEN  This is a list of the screening recommended for you and due dates:  Health Maintenance  Topic Date Due   DTaP/Tdap/Td vaccine (1 - Tdap) Never done   COVID-19 Vaccine (4 - 2024-25 season) 11/06/2022   Cologuard (Stool DNA test)  05/12/2023   Medicare Annual Wellness Visit  05/08/2024   Pneumonia Vaccine  Completed   Flu Shot  Completed   Hepatitis C Screening  Completed   Zoster (Shingles) Vaccine  Completed   HPV Vaccine  Aged Out    Advanced directives: (ACP Link)Information on Advanced Care Planning can be found at [facility] Advance Health Care Directives Advance Health Care Directives ([email or web address]   Next Medicare Annual Wellness Visit scheduled for next year: Yes   05/14/24 @ 10:10 AM IN PERSON

## 2023-05-09 NOTE — Progress Notes (Signed)
 Subjective:   Kevin Melton is a 73 y.o. who presents for a Medicare Wellness preventive visit.  Visit Complete: In person  AWV Questionnaire: No: Patient Medicare AWV questionnaire was not completed prior to this visit.  Cardiac Risk Factors include: advanced age (>47men, >73 women);dyslipidemia;hypertension;male gender     Objective:    Today's Vitals   05/09/23 0936  BP: 128/72  Weight: 199 lb 4.8 oz (90.4 kg)  Height: 5\' 9"  (1.753 m)   Body mass index is 29.43 kg/m.     05/09/2023    9:43 AM 03/16/2023    7:56 AM 03/02/2023   10:36 AM 05/04/2022    9:47 AM 03/24/2022   10:12 AM 02/28/2022    6:25 AM 12/08/2021    9:08 PM  Advanced Directives  Does Patient Have a Medical Advance Directive? No No No No No No No  Would patient like information on creating a medical advance directive? No - Patient declined No - Patient declined Yes (MAU/Ambulatory/Procedural Areas - Information given)   No - Patient declined     Current Medications (verified) Outpatient Encounter Medications as of 05/09/2023  Medication Sig   allopurinol (ZYLOPRIM) 300 MG tablet TAKE 1 TABLET BY MOUTH EVERYDAY AT BEDTIME   amLODipine (NORVASC) 10 MG tablet Take 1 tablet (10 mg total) by mouth daily.   aspirin 81 MG chewable tablet Chew 1 tablet (81 mg total) by mouth at bedtime.   atorvastatin (LIPITOR) 40 MG tablet Take 1 tablet (40 mg total) by mouth daily.   benazepril (LOTENSIN) 40 MG tablet TAKE 1 TABLET BY MOUTH EVERY DAY   finasteride (PROSCAR) 5 MG tablet TAKE 1 TABLET (5 MG TOTAL) BY MOUTH DAILY.   nitroGLYCERIN (NITROSTAT) 0.4 MG SL tablet Place 1 tablet (0.4 mg total) under the tongue every 5 (five) minutes as needed for chest pain.   oxybutynin (DITROPAN) 5 MG tablet Take 1 tablet (5 mg total) by mouth every 8 (eight) hours as needed for bladder spasms.   No facility-administered encounter medications on file as of 05/09/2023.    Allergies (verified) Patient has no known allergies.    History: Past Medical History:  Diagnosis Date   Acontractile bladder 06/17/2021   a.) secondary to BPH/detrusor hypotonicity; b.) requires self CIC 4x/day   Aortic atherosclerosis (HCC)    Atherosclerotic heart disease of native coronary artery with unstable angina pectoris (HCC)    BPH (benign prostatic hyperplasia)    Bradycardia    Chronic kidney disease, stage III (moderate) (HCC)    Coronary artery disease    a. 2013 Inf MI/PCI: BMS to RCA; b. 11/2013 MV: small reversible apical anteroseptal defect/ischemia. EF 60%; c. 11/2020 MV: small, fixed basal inf defect - scar vs artifact. No ischemia. EF 45-50% (60-65% by echo).   Diastolic dysfunction    a.) TTE 11/19/2020: EF 60-65%, mild LVH, mild MR/AR, G1DD   Dyshidrotic eczema    GERD (gastroesophageal reflux disease)    Gout    Grade I diastolic dysfunction    Hepatic cyst    History of heart artery stent    History of umbilical hernia repair    Hyperlipidemia    Hypertension    Inferior MI (HCC) 04/2011   a.) MI 04/2011 --> treated in [place] --> BMS (unknown type) to RCA   Kidney stones    Mild aortic regurgitation    Mild mitral regurgitation by prior echocardiogram    Motion sickness    Boats and circular motion   Post  herpetic neuralgia    Prediabetes    RBBB    Sigmoid diverticulosis    Skin cancer    Wears dentures    Has full upper and lower.  Only wears upper   Past Surgical History:  Procedure Laterality Date   CATARACT EXTRACTION W/PHACO Right 03/02/2023   Procedure: CATARACT EXTRACTION PHACO AND INTRAOCULAR LENS PLACEMENT (IOC) RIGHT MALYUGIN OMIDRIA 9.37 00:55.6;  Surgeon: Estanislado Pandy, MD;  Location: [facility];  Service: Ophthalmology;  Laterality: Right;   CATARACT EXTRACTION W/PHACO Left 03/16/2023   Procedure: CATARACT EXTRACTION PHACO AND INTRAOCULAR LENS PLACEMENT (IOC) LEFT MALYUGIN OMIDRIA;  Surgeon: Estanislado Pandy, MD;  Location: [facility];  Service:  Ophthalmology;  Laterality: Left;  7.84 0:45.6   COLONOSCOPY  2013   CORONARY ANGIOPLASTY WITH STENT PLACEMENT  2013   [place]   CYSTOSCOPY/URETEROSCOPY/HOLMIUM LASER/STENT PLACEMENT Right 03/29/2022   Procedure: CYSTOSCOPY/URETEROSCOPY/HOLMIUM LASER/STENT PLACEMENT/BILATERAL RETROGRADE [id number];  Surgeon: Riki Altes, MD;  Location: [facility];  Service: Urology;  Laterality: Right;   ESOPHAGOGASTRODUODENOSCOPY N/A 12/08/2021   Procedure: ESOPHAGOGASTRODUODENOSCOPY (EGD);  Surgeon: Toledo, Boykin Nearing, MD;  Location: [facility];  Service: Gastroenterology;  Laterality: N/A;   ESOPHAGOGASTRODUODENOSCOPY (EGD) WITH PROPOFOL N/A 01/05/2017   Procedure: ESOPHAGOGASTRODUODENOSCOPY (EGD) WITH PROPOFOL With Dilation;  Surgeon: Toney Reil, MD;  Location: [facility];  Service: Gastroenterology;  Laterality: N/A;   ESOPHAGOGASTRODUODENOSCOPY (EGD) WITH PROPOFOL N/A 07/03/2019   Procedure: ESOPHAGOGASTRODUODENOSCOPY (EGD) WITH PROPOFOL;  Surgeon: Toney Reil, MD;  Location: [facility];  Service: Gastroenterology;  Laterality: N/A;   ESOPHAGOGASTRODUODENOSCOPY (EGD) WITH PROPOFOL N/A 11/04/2019   Procedure: ESOPHAGOGASTRODUODENOSCOPY (EGD) WITH PROPOFOL;  Surgeon: Toney Reil, MD;  Location: [facility];  Service: Gastroenterology;  Laterality: N/A;   HERNIA REPAIR     LITHOTRIPSY  2014   UMBILICAL HERNIA REPAIR N/A 02/20/2017   Primary repair 2 cm umbilical hernia;  Surgeon: Earline Mayotte, MD;  Location: [facility];  Service: General;  Laterality: N/A;   URETEROSCOPY Left 03/29/2022   Procedure: DIAGNOSTIC URETEROSCOPY/LEFT STENT PLACEMENT;  Surgeon: Riki Altes, MD;  Location: [facility];  Service: Urology;  Laterality: Left;   Family History  Problem Relation Age of Onset   Dementia Mother 33   Lung disease Father 5       collapsed lung   Healthy Sister    Healthy Brother    Heart Problems Brother    Healthy Son    Healthy Sister    Colon cancer  Neg Hx    Prostate cancer Neg Hx    Heart disease Neg Hx    Social History   Socioeconomic History   Marital status: Married    Spouse name: [name]   Number of children: 1   Years of education: 12   Highest education level: 12th grade  Occupational History   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    Comment: semi-retired  Tobacco Use   Smoking status: Never   Smokeless tobacco: Never  Vaping Use   Vaping status: Never Used  Substance and Sexual Activity   Alcohol use: No   Drug use: No   Sexual activity: Yes    Partners: Female  Other Topics Concern   Not on file  Social History Narrative   Not on file   Social Drivers of Health   Financial Resource Strain: Low Risk  (05/09/2023)   Overall Financial Resource Strain (CARDIA)    Difficulty of Paying Living Expenses: Not  hard at all  Food Insecurity: No Food Insecurity (05/09/2023)   Hunger Vital Sign    Worried About Running Out of Food in the Last Year: Never true    Ran Out of Food in the Last Year: Never true  Transportation Needs: No Transportation Needs (05/09/2023)   PRAPARE - Administrator, Civil Service (Medical): No    Lack of Transportation (Non-Medical): No  Physical Activity: Sufficiently Active (05/09/2023)   Exercise Vital Sign    Days of Exercise per Week: 5 days    Minutes of Exercise per Session: 30 min  Stress: No Stress Concern Present (05/09/2023)   [employer] of Occupational Health - Occupational Stress Questionnaire    Feeling of Stress : Not at all  Social Connections: Moderately Integrated (05/09/2023)   Social Connection and Isolation Panel [NHANES]    Frequency of Communication with Friends and Family: More than three times a week    Frequency of Social Gatherings with Friends and Family: Twice a week    Attends Religious Services: 1 to 4 times per year    Active Member of [employer] or Organizations: No    Attends Engineer, structural: Never     Marital Status: Married    Tobacco Counseling Counseling given: Not Answered    Clinical Intake:  Pre-visit preparation completed: Yes  Pain : No/denies pain     BMI - recorded: 29.43 Nutritional Status: BMI 25 -29 Overweight Nutritional Risks: None Diabetes: No  How often do you need to have someone help you when you read instructions, pamphlets, or other written materials from your doctor or pharmacy?: 1 - Never  Interpreter Needed?: No  Information entered by :: Kennedy Bucker, LPN   Activities of Daily Living      05/09/2023    9:44 AM 05/09/2023    8:39 AM  In your present state of health, do you have any difficulty performing the following activities:  Hearing? 0 0  Vision? 0 0  Difficulty concentrating or making decisions? 0 0  Walking or climbing stairs? 0 0  Dressing or bathing? 0 0  Doing errands, shopping? 0 0  Preparing Food and eating ? N N  Using the Toilet? N N  In the past six months, have you accidently leaked urine? N N  Do you have problems with loss of bowel control? N N  Managing your Medications? N N  Managing your Finances? N N  Housekeeping or managing your Housekeeping? N N    Patient Care Team: Erasmo Downer, MD as PCP - General (Family Medicine) End, Cristal Deer, MD as PCP - Cardiology (Cardiology) Estanislado Pandy, MD as Consulting Physician (Ophthalmology)  Indicate any recent Medical Services you may have received from other than [facility] providers in the past year (date may be approximate).     Assessment:   This is a routine wellness examination for [place].  Hearing/Vision screen Hearing Screening - Comments:: NO AIDS Vision Screening - Comments:: READERS, HAD CATARACT SGY- DR.HOFFMAN   Goals Addressed             This Visit's Progress    DIET - INCREASE WATER INTAKE         Depression Screen     05/09/2023    9:42 AM 12/08/2022    8:39 AM 09/01/2022    1:12 PM 05/04/2022    9:36 AM 10/18/2021    8:30 AM  05/03/2021    9:49 AM 02/19/2021   10:18 AM  PHQ 2/9 Scores  PHQ - 2 Score 0 0 0 0 0 0 0  PHQ- 9 Score 0          Fall Risk     05/09/2023    9:44 AM 05/09/2023    8:39 AM 12/08/2022    8:38 AM 09/01/2022    1:12 PM 05/04/2022    9:34 AM  Fall Risk   Falls in the past year? 0 1 0 0 0  Number falls in past yr: 0  0 0 0  Injury with Fall? 0 0 0 0 0  Risk for fall due to : No Fall Risks  No Fall Risks No Fall Risks No Fall Risks  Follow up Falls prevention discussed;Falls evaluation completed   Falls evaluation completed Education provided;Falls prevention discussed    MEDICARE RISK AT HOME:  Medicare Risk at Home Any stairs in or around the home?: No If so, are there any without handrails?: No Home free of loose throw rugs in walkways, pet beds, electrical cords, etc?: Yes Adequate lighting in your home to reduce risk of falls?: Yes Life alert?: No Use of a cane, walker or w/c?: No Grab bars in the bathroom?: Yes Shower chair or bench in shower?: No Elevated toilet seat or a handicapped toilet?: No  TIMED UP AND GO:  Was the test performed?  Yes  Length of time to ambulate 10 feet: 4 sec Gait steady and fast without use of assistive device  Cognitive Function: 6CIT completed        05/09/2023    9:48 AM 05/04/2022    9:40 AM 05/03/2021   10:26 AM 04/19/2018    8:46 AM  6CIT Screen  What Year? 0 points 0 points 0 points 0 points  What month? 0 points 0 points 0 points 0 points  What time? 0 points 0 points 0 points 0 points  Count back from 20 0 points 0 points 0 points 0 points  Months in reverse 0 points 0 points 0 points 0 points  Repeat phrase 0 points 0 points 0 points 0 points  Total Score 0 points 0 points 0 points 0 points    Immunizations Immunization History  Administered Date(s) Administered   Fluad Quad(high Dose 65+) 10/26/2018, 02/19/2021   Fluad Trivalent(High Dose 65+) 12/08/2022   Influenza, High Dose Seasonal PF 11/10/2016, 12/19/2017   Moderna  Sars-Covid-2 Vaccination 04/05/2019, 05/01/2019, 01/05/2020   Pneumococcal Conjugate-13 11/10/2016   Pneumococcal Polysaccharide-23 04/19/2018   Zoster Recombinant(Shingrix) 12/08/2022    Screening Tests Health Maintenance  Topic Date Due   DTaP/Tdap/Td (1 - Tdap) Never done   COVID-19 Vaccine (4 - 2024-25 season) 11/06/2022   Zoster Vaccines- Shingrix (2 of 2) 02/02/2023   Fecal DNA (Cologuard)  05/12/2023   Medicare Annual Wellness (AWV)  05/08/2024   Pneumonia Vaccine 81+ Years old  Completed   INFLUENZA VACCINE  Completed   Hepatitis C Screening  Completed   HPV VACCINES  Aged Out    Health Maintenance  Health Maintenance Due  Topic Date Due   DTaP/Tdap/Td (1 - Tdap) Never done   COVID-19 Vaccine (4 - 2024-25 season) 11/06/2022   Zoster Vaccines- Shingrix (2 of 2) 02/02/2023   Health Maintenance Items Addressed: Shingrix vaccine given  Additional Screening:  Vision Screening: Recommended annual ophthalmology exams for early detection of glaucoma and other disorders of the eye.  Dental Screening: Recommended annual dental exams for proper oral hygiene  Community Resource Referral / Chronic Care Management: CRR required  this visit?  No   CCM required this visit?  No     Plan:     I have personally reviewed and noted the following in the patient's chart:   Medical and social history Use of alcohol, tobacco or illicit drugs  Current medications and supplements including opioid prescriptions. Patient is not currently taking opioid prescriptions. Functional ability and status Nutritional status Physical activity Advanced directives List of other physicians Hospitalizations, surgeries, and ER visits in previous 12 months Vitals Screenings to include cognitive, depression, and falls Referrals and appointments  In addition, I have reviewed and discussed with patient certain preventive protocols, quality metrics, and best practice recommendations. A written  personalized care plan for preventive services as well as general preventive health recommendations were provided to patient.     Hal Hope, LPN   03/12/1094   After Visit Summary: (In Person-Declined) Patient declined AVS at this time.  Notes:  2ND SHINGRIX VACCINE GIVEN

## 2023-05-29 ENCOUNTER — Ambulatory Visit: Payer: PPO | Admitting: Urology

## 2023-05-29 ENCOUNTER — Encounter: Payer: Self-pay | Admitting: Urology

## 2023-05-29 VITALS — BP 154/62 | HR 53 | Ht 69.0 in | Wt 199.0 lb

## 2023-05-29 DIAGNOSIS — N400 Enlarged prostate without lower urinary tract symptoms: Secondary | ICD-10-CM

## 2023-05-29 DIAGNOSIS — R339 Retention of urine, unspecified: Secondary | ICD-10-CM

## 2023-05-29 NOTE — Progress Notes (Signed)
 05/29/2023 2:58 PM   Kevin Melton 04-24-1950 [id number]  Referring provider: Erasmo Downer, MD [address] [place],  [place] [address]  Chief Complaint  Patient presents with   interstim    HPI: ST:   Previous stone disease and has had a Lasix renogram UVJ.  He has had ureteroscopy.  CIC 4 times a day with coud catheter.  Patient for urodynamics May 2023 in [place].  Maximum bladder capacity 617 mL.  Did not generate a detrusor contraction  Today Patient had a sudden onset of retention about 2 years ago.  He has no trouble catheterizing.  He stays dry.  He had 1 bladder infection.  No neurologic risk factors or symptoms I reviewed the urodynamics from 2023.  His capacity was approximately 617 mL.  He had significant loss of bladder compliance that was impressive.  And feeling pressure was 48 cm of water.  He did not reflux.  He had a trabeculated bladder.  He may have had some small diverticuli at the bottom of the bladder with evidence of enlarged prostate   PMH: Past Medical History:  Diagnosis Date   Acontractile bladder 06/17/2021   a.) secondary to BPH/detrusor hypotonicity; b.) requires self CIC 4x/day   Aortic atherosclerosis (HCC)    Atherosclerotic heart disease of native coronary artery with unstable angina pectoris (HCC)    BPH (benign prostatic hyperplasia)    Bradycardia    Chronic kidney disease, stage III (moderate) (HCC)    Coronary artery disease    a. 2013 Inf MI/PCI: BMS to RCA; b. 11/2013 MV: small reversible apical anteroseptal defect/ischemia. EF 60%; c. 11/2020 MV: small, fixed basal inf defect - scar vs artifact. No ischemia. EF 45-50% (60-65% by echo).   Diastolic dysfunction    a.) TTE 11/19/2020: EF 60-65%, mild LVH, mild MR/AR, G1DD   Dyshidrotic eczema    GERD (gastroesophageal reflux disease)    Gout    Grade I diastolic dysfunction    Hepatic cyst    History of heart artery stent    History of umbilical hernia  repair    Hyperlipidemia    Hypertension    Inferior MI (HCC) 04/2011   a.) MI 04/2011 --> treated in [place] --> BMS (unknown type) to RCA   Kidney stones    Mild aortic regurgitation    Mild mitral regurgitation by prior echocardiogram    Motion sickness    Boats and circular motion   Post herpetic neuralgia    Prediabetes    RBBB    Sigmoid diverticulosis    Skin cancer    Wears dentures    Has full upper and lower.  Only wears upper    Surgical History: Past Surgical History:  Procedure Laterality Date   CATARACT EXTRACTION W/PHACO Right 03/02/2023   Procedure: CATARACT EXTRACTION PHACO AND INTRAOCULAR LENS PLACEMENT (IOC) RIGHT MALYUGIN OMIDRIA 9.37 00:55.6;  Surgeon: Estanislado Pandy, MD;  Location: [facility];  Service: Ophthalmology;  Laterality: Right;   CATARACT EXTRACTION W/PHACO Left 03/16/2023   Procedure: CATARACT EXTRACTION PHACO AND INTRAOCULAR LENS PLACEMENT (IOC) LEFT MALYUGIN OMIDRIA;  Surgeon: Estanislado Pandy, MD;  Location: [facility];  Service: Ophthalmology;  Laterality: Left;  7.84 0:45.6   COLONOSCOPY  2013   CORONARY ANGIOPLASTY WITH STENT PLACEMENT  2013   [place]   CYSTOSCOPY/URETEROSCOPY/HOLMIUM LASER/STENT PLACEMENT Right 03/29/2022   Procedure: CYSTOSCOPY/URETEROSCOPY/HOLMIUM LASER/STENT PLACEMENT/BILATERAL RETROGRADE [id number];  Surgeon: Riki Altes, MD;  Location: [facility];  Service: Urology;  Laterality: Right;   ESOPHAGOGASTRODUODENOSCOPY N/A 12/08/2021   Procedure: ESOPHAGOGASTRODUODENOSCOPY (EGD);  Surgeon: Toledo, Boykin Nearing, MD;  Location: [facility];  Service: Gastroenterology;  Laterality: N/A;   ESOPHAGOGASTRODUODENOSCOPY (EGD) WITH PROPOFOL N/A 01/05/2017   Procedure: ESOPHAGOGASTRODUODENOSCOPY (EGD) WITH PROPOFOL With Dilation;  Surgeon: Toney Reil, MD;  Location: [facility];  Service: Gastroenterology;  Laterality: N/A;   ESOPHAGOGASTRODUODENOSCOPY (EGD) WITH PROPOFOL N/A  07/03/2019   Procedure: ESOPHAGOGASTRODUODENOSCOPY (EGD) WITH PROPOFOL;  Surgeon: Toney Reil, MD;  Location: [facility];  Service: Gastroenterology;  Laterality: N/A;   ESOPHAGOGASTRODUODENOSCOPY (EGD) WITH PROPOFOL N/A 11/04/2019   Procedure: ESOPHAGOGASTRODUODENOSCOPY (EGD) WITH PROPOFOL;  Surgeon: Toney Reil, MD;  Location: [facility];  Service: Gastroenterology;  Laterality: N/A;   HERNIA REPAIR     LITHOTRIPSY  2014   UMBILICAL HERNIA REPAIR N/A 02/20/2017   Primary repair 2 cm umbilical hernia;  Surgeon: Earline Mayotte, MD;  Location: [facility];  Service: General;  Laterality: N/A;   URETEROSCOPY Left 03/29/2022   Procedure: DIAGNOSTIC URETEROSCOPY/LEFT STENT PLACEMENT;  Surgeon: Riki Altes, MD;  Location: [facility];  Service: Urology;  Laterality: Left;    Home Medications:  Allergies as of 05/29/2023   No Known Allergies      Medication List        Accurate as of May 29, 2023  2:58 PM. If you have any questions, ask your nurse or doctor.          allopurinol 300 MG tablet Commonly known as: ZYLOPRIM TAKE 1 TABLET BY MOUTH EVERYDAY AT BEDTIME   amLODipine 10 MG tablet Commonly known as: NORVASC Take 1 tablet (10 mg total) by mouth daily.   aspirin 81 MG chewable tablet Chew 1 tablet (81 mg total) by mouth at bedtime.   atorvastatin 40 MG tablet Commonly known as: LIPITOR Take 1 tablet (40 mg total) by mouth daily.   benazepril 40 MG tablet Commonly known as: LOTENSIN TAKE 1 TABLET BY MOUTH EVERY DAY   finasteride 5 MG tablet Commonly known as: PROSCAR TAKE 1 TABLET (5 MG TOTAL) BY MOUTH DAILY.   nitroGLYCERIN 0.4 MG SL tablet Commonly known as: NITROSTAT Place 1 tablet (0.4 mg total) under the tongue every 5 (five) minutes as needed for chest pain.   oxybutynin 5 MG tablet Commonly known as: DITROPAN Take 1 tablet (5 mg total) by mouth every 8 (eight) hours as needed for bladder spasms.        Allergies: No Known  Allergies  Family History: Family History  Problem Relation Age of Onset   Dementia Mother 69   Lung disease Father 32       collapsed lung   Healthy Sister    Healthy Brother    Heart Problems Brother    Healthy Son    Healthy Sister    Colon cancer Neg Hx    Prostate cancer Neg Hx    Heart disease Neg Hx     Social History:  reports that he has never smoked. He has never used smokeless tobacco. He reports that he does not drink alcohol and does not use drugs.  ROS:                                        Physical Exam: BP (!) 154/62   Pulse (!) 53   Ht 5\' 9"  (1.753 m)   Wt 90.3 kg   BMI  29.39 kg/m   Constitutional:  Alert and oriented, No acute distress. HEENT: [place] AT, moist mucus membranes.  Trachea midline, no masses. .  Laboratory Data: Lab Results  Component Value Date   WBC 8.8 09/01/2022   HGB 13.7 09/01/2022   HCT 41.7 09/01/2022   MCV 91 09/01/2022   PLT 285 09/01/2022    Lab Results  Component Value Date   CREATININE 1.27 03/22/2023    No results found for: "PSA"  No results found for: "TESTOSTERONE"  Lab Results  Component Value Date   HGBA1C 6.3 (H) 12/08/2022    Urinalysis    Component Value Date/Time   COLORURINE ORANGE (A) 02/28/2022 0626   APPEARANCEUR Cloudy (A) 08/04/2022 1330   LABSPEC 1.018 02/28/2022 0626   PHURINE  02/28/2022 0626    TEST NOT REPORTED DUE TO COLOR INTERFERENCE OF URINE PIGMENT   GLUCOSEU Negative 08/04/2022 1330   HGBUR (A) 02/28/2022 0626    TEST NOT REPORTED DUE TO COLOR INTERFERENCE OF URINE PIGMENT   BILIRUBINUR Negative 08/04/2022 1330   KETONESUR (A) 02/28/2022 0626    TEST NOT REPORTED DUE TO COLOR INTERFERENCE OF URINE PIGMENT   PROTEINUR 2+ (A) 08/04/2022 1330   PROTEINUR (A) 02/28/2022 0626    TEST NOT REPORTED DUE TO COLOR INTERFERENCE OF URINE PIGMENT   NITRITE Positive (A) 08/04/2022 1330   NITRITE (A) 02/28/2022 0626    TEST NOT REPORTED DUE TO COLOR INTERFERENCE OF  URINE PIGMENT   LEUKOCYTESUR 2+ (A) 08/04/2022 1330   LEUKOCYTESUR (A) 02/28/2022 0626    TEST NOT REPORTED DUE TO COLOR INTERFERENCE OF URINE PIGMENT    Pertinent Imaging:   Assessment & Plan: Pathophysiology of retention discussed.  Patient understands it appears he is bladder failure and that InterStim is not a treatment for this.  He was understanding.  He will follow-up with Dr. Lonna Cobb as scheduled.  1. Urinary retention (Primary)  - Urinalysis, Complete  2. BPH without obstruction/lower urinary tract symptoms  - Urinalysis, Complete   No follow-ups on file.  Martina Sinner, MD  [facility] [address], [address] [place], [place] [address] [phone]

## 2023-06-01 ENCOUNTER — Encounter: Payer: Self-pay | Admitting: Family Medicine

## 2023-06-13 ENCOUNTER — Encounter: Admitting: Family Medicine

## 2023-06-19 ENCOUNTER — Encounter: Admitting: Family Medicine

## 2023-07-05 ENCOUNTER — Other Ambulatory Visit: Payer: Self-pay | Admitting: Family Medicine

## 2023-07-05 MED ORDER — BENAZEPRIL HCL 40 MG PO TABS: 40.0000 mg | ORAL_TABLET | Freq: Every day | ORAL | 0 refills | Status: DC

## 2023-07-25 DIAGNOSIS — R339 Retention of urine, unspecified: Secondary | ICD-10-CM | POA: Diagnosis not present

## 2023-08-01 ENCOUNTER — Ambulatory Visit (INDEPENDENT_AMBULATORY_CARE_PROVIDER_SITE_OTHER): Admitting: Family Medicine

## 2023-08-01 ENCOUNTER — Encounter: Payer: Self-pay | Admitting: Family Medicine

## 2023-08-01 VITALS — BP 134/52 | HR 50 | Ht 69.0 in | Wt 199.4 lb

## 2023-08-01 DIAGNOSIS — Z0000 Encounter for general adult medical examination without abnormal findings: Secondary | ICD-10-CM | POA: Diagnosis not present

## 2023-08-01 DIAGNOSIS — I10 Essential (primary) hypertension: Secondary | ICD-10-CM

## 2023-08-01 DIAGNOSIS — R7303 Prediabetes: Secondary | ICD-10-CM

## 2023-08-01 DIAGNOSIS — E782 Mixed hyperlipidemia: Secondary | ICD-10-CM | POA: Diagnosis not present

## 2023-08-01 DIAGNOSIS — N1831 Chronic kidney disease, stage 3a: Secondary | ICD-10-CM | POA: Diagnosis not present

## 2023-08-01 DIAGNOSIS — R972 Elevated prostate specific antigen [PSA]: Secondary | ICD-10-CM

## 2023-08-01 DIAGNOSIS — I2510 Atherosclerotic heart disease of native coronary artery without angina pectoris: Secondary | ICD-10-CM | POA: Diagnosis not present

## 2023-08-01 DIAGNOSIS — Z1211 Encounter for screening for malignant neoplasm of colon: Secondary | ICD-10-CM

## 2023-08-01 NOTE — Assessment & Plan Note (Signed)
 Hypertension is well-controlled with blood pressure readings within normal limits.

## 2023-08-01 NOTE — Assessment & Plan Note (Signed)
Followed by cardiology Doing well and asymptomatic Continue aspirin, statin, antihypertensives Nitroglycerin as needed

## 2023-08-01 NOTE — Patient Instructions (Signed)
Consider asking at the pharmacy about the tetanus shot (TDAP) ?

## 2023-08-01 NOTE — Assessment & Plan Note (Signed)
 Recommend low carb diet Recheck A1c

## 2023-08-01 NOTE — Assessment & Plan Note (Signed)
Well-controlled on atorvastatin 40mg  daily. -Continue atorvastatin 40mg  daily.

## 2023-08-01 NOTE — Assessment & Plan Note (Signed)
 Kidney function is well-managed. Monitoring will continue as part of routine lab work. - Order kidney function tests as part of routine lab work.

## 2023-08-01 NOTE — Progress Notes (Signed)
 Complete physical exam   Patient: Kevin Melton   DOB: 06-13-1950   73 y.o. Male  MRN: [id number] Visit Date: 08/01/2023  Today's healthcare provider: Aden Agreste, MD   Chief Complaint  Patient presents with   Annual Exam    Diet -  General, healthy to well balanced Exercise - no formal routine but active Feeling - well Sleeping - well Concerns - none    Subjective    Kevin Melton is a 73 y.o. male who presents today for a complete physical exam.    Discussed the use of AI scribe software for clinical note transcription with the patient, who gave verbal consent to proceed.  History of Present Illness   Kevin Melton is a 73 year old male who presents for his annual physical exam.  He is due for a Tdap vaccine and plans to check at the pharmacy for this. Hypertension is managed with amlodipine  10 mg daily and benazepril  40 mg daily, with well-controlled blood pressure. Chronic kidney disease stage 3A, prediabetes, and hyperlipidemia are managed with atorvastatin  40 mg daily. Gout is managed with allopurinol  300 mg QHS. He experienced shingles with residual nerve damage, particularly noticeable during yard work, but the pain is manageable without gabapentin . He notes a slow heartbeat since a heart attack but feels fine without medication.        Last depression screening scores    05/09/2023    9:42 AM 12/08/2022    8:39 AM 09/01/2022    1:12 PM  PHQ 2/9 Scores  PHQ - 2 Score 0 0 0  PHQ- 9 Score 0     Last fall risk screening    05/09/2023    9:44 AM  Fall Risk   Falls in the past year? 0  Number falls in past yr: 0  Injury with Fall? 0  Risk for fall due to : No Fall Risks  Follow up Falls prevention discussed;Falls evaluation completed        Medications: Outpatient Medications Prior to Visit  Medication Sig   allopurinol  (ZYLOPRIM ) 300 MG tablet TAKE 1 TABLET BY MOUTH EVERYDAY AT BEDTIME   amLODipine  (NORVASC ) 10 MG tablet  Take 1 tablet (10 mg total) by mouth daily.   aspirin  81 MG chewable tablet Chew 1 tablet (81 mg total) by mouth at bedtime.   atorvastatin  (LIPITOR) 40 MG tablet Take 1 tablet (40 mg total) by mouth daily.   benazepril  (LOTENSIN ) 40 MG tablet Take 1 tablet (40 mg total) by mouth daily.   finasteride  (PROSCAR ) 5 MG tablet TAKE 1 TABLET (5 MG TOTAL) BY MOUTH DAILY.   nitroGLYCERIN  (NITROSTAT ) 0.4 MG SL tablet Place 1 tablet (0.4 mg total) under the tongue every 5 (five) minutes as needed for chest pain.   oxybutynin  (DITROPAN ) 5 MG tablet Take 1 tablet (5 mg total) by mouth every 8 (eight) hours as needed for bladder spasms.   No facility-administered medications prior to visit.    Review of Systems    Objective    BP (!) 134/52 (BP Location: Left Arm, Patient Position: Sitting, Cuff Size: Large)   Pulse (!) 50   Ht 5\' 9"  (1.753 m)   Wt 199 lb 6.4 oz (90.4 kg)   SpO2 97%   BMI 29.45 kg/m    Physical Exam Vitals reviewed.  Constitutional:      General: He is not in acute distress.    Appearance: Normal appearance. He is well-developed. He is not diaphoretic.  HENT:     Head: Normocephalic and atraumatic.     Right Ear: Tympanic membrane, ear canal and external ear normal.     Left Ear: Tympanic membrane, ear canal and external ear normal.     Nose: Nose normal.     Mouth/Throat:     Mouth: Mucous membranes are moist.     Pharynx: Oropharynx is clear. No oropharyngeal exudate.  Eyes:     General: No scleral icterus.    Conjunctiva/sclera: Conjunctivae normal.     Pupils: Pupils are equal, round, and reactive to light.  Neck:     Thyroid : No thyromegaly.  Cardiovascular:     Rate and Rhythm: Normal rate and regular rhythm.     Heart sounds: Normal heart sounds. No murmur heard. Pulmonary:     Effort: Pulmonary effort is normal. No respiratory distress.     Breath sounds: Normal breath sounds. No wheezing or rales.  Abdominal:     General: There is no distension.      Palpations: Abdomen is soft.     Tenderness: There is no abdominal tenderness.  Musculoskeletal:        General: No deformity.     Cervical back: Neck supple.     Right lower leg: No edema.     Left lower leg: No edema.  Lymphadenopathy:     Cervical: No cervical adenopathy.  Skin:    General: Skin is warm and dry.     Findings: No rash.  Neurological:     Mental Status: He is alert and oriented to person, place, and time. Mental status is at baseline.     Gait: Gait normal.  Psychiatric:        Mood and Affect: Mood normal.        Behavior: Behavior normal.        Thought Content: Thought content normal.      No results found for any visits on 08/01/23.  Assessment & Plan    Routine Health Maintenance and Physical Exam  Exercise Activities and Dietary recommendations  Goals      DIET - EAT MORE FRUITS AND VEGETABLES     DIET - INCREASE WATER INTAKE     Exercise 3x per week (30 min per time)     Recommend to start fast walking three times a week for at least 30 minutes at a time.         Immunization History  Administered Date(s) Administered   Fluad Quad(high Dose 65+) 10/26/2018, 02/19/2021   Fluad Trivalent(High Dose 65+) 12/08/2022   Influenza, High Dose Seasonal PF 11/10/2016, 12/19/2017   Moderna Sars-Covid-2 Vaccination 04/05/2019, 05/01/2019, 01/05/2020   Pneumococcal Conjugate-13 11/10/2016   Pneumococcal Polysaccharide-23 04/19/2018   Zoster Recombinant(Shingrix ) 12/08/2022, 05/09/2023    Health Maintenance  Topic Date Due   DTaP/Tdap/Td (1 - Tdap) Never done   COVID-19 Vaccine (4 - 2024-25 season) 11/06/2022   Fecal DNA (Cologuard)  05/12/2023   INFLUENZA VACCINE  10/06/2023   Medicare Annual Wellness (AWV)  05/08/2024   Pneumonia Vaccine 46+ Years old  Completed   Hepatitis C Screening  Completed   Zoster Vaccines- Shingrix   Completed   HPV VACCINES  Aged Out   Meningococcal B Vaccine  Aged Out    Discussed health benefits of physical  activity, and encouraged him to engage in regular exercise appropriate for his age and condition.  Problem List Items Addressed This Visit       Cardiovascular and Mediastinum   Essential hypertension  with goal blood pressure less than 130/80   Hypertension is well-controlled with blood pressure readings within normal limits.      Coronary artery disease involving native coronary artery of native heart without angina pectoris   Followed by cardiology Doing well and asymptomatic Continue aspirin , statin, antihypertensives Nitroglycerin  as needed        Genitourinary   CKD stage 3a, GFR 45-59 ml/min (HCC)   Kidney function is well-managed. Monitoring will continue as part of routine lab work. - Order kidney function tests as part of routine lab work.        Other   Mixed hyperlipidemia   Well-controlled on atorvastatin  40mg  daily. -Continue atorvastatin  40mg  daily.      Relevant Orders   Comprehensive metabolic panel with GFR   Lipid panel   Prediabetes   Recommend low carb diet Recheck A1c       Relevant Orders   Hemoglobin A1c   Elevated PSA   Relevant Orders   PSA Total (Reflex To Free)   Other Visit Diagnoses       Encounter for annual physical exam    -  Primary     Colon cancer screening       Relevant Orders   Cologuard           Elevated PSA PSA levels have not been checked this year and are due for evaluation. F/b Urology - Order PSA test as part of routine lab work.  Postherpetic neuralgia Postherpetic neuralgia persists at the site of previous shingles outbreak. Symptoms are manageable with heat application and do not require medication at this time.  General Health Maintenance Tdap vaccination and colon cancer screening are due. Previous liver cysts were simple and do not require further imaging. - Check with pharmacy for Tdap vaccination. - Reorder Cologuard for colon cancer screening.       Return in about 6 months (around 02/01/2024)  for chronic disease f/u.     Aden Agreste, MD  [facility] [phone] (phone) [phone] (fax)  [facility]

## 2023-08-02 LAB — COMPREHENSIVE METABOLIC PANEL WITH GFR
ALT: 22 IU/L (ref 0–44)
AST: 21 IU/L (ref 0–40)
Albumin: 4.3 g/dL (ref 3.8–4.8)
Alkaline Phosphatase: 70 IU/L (ref 44–121)
BUN/Creatinine Ratio: 20 (ref 10–24)
BUN: 23 mg/dL (ref 8–27)
Bilirubin Total: 0.4 mg/dL (ref 0.0–1.2)
CO2: 21 mmol/L (ref 20–29)
Calcium: 9.2 mg/dL (ref 8.6–10.2)
Chloride: 104 mmol/L (ref 96–106)
Creatinine, Ser: 1.15 mg/dL (ref 0.76–1.27)
Globulin, Total: 3.1 g/dL (ref 1.5–4.5)
Glucose: 82 mg/dL (ref 70–99)
Potassium: 4.3 mmol/L (ref 3.5–5.2)
Sodium: 139 mmol/L (ref 134–144)
Total Protein: 7.4 g/dL (ref 6.0–8.5)
eGFR: 68 mL/min/{1.73_m2} (ref >59)

## 2023-08-02 LAB — PSA TOTAL (REFLEX TO FREE): Prostate Specific Ag, Serum: 3.8 ng/mL (ref 0.0–4.0)

## 2023-08-02 LAB — LIPID PANEL
Chol/HDL Ratio: 3.6 ratio (ref 0.0–5.0)
Cholesterol, Total: 129 mg/dL (ref 100–199)
HDL: 36 mg/dL — ABNORMAL LOW (ref >39)
LDL Chol Calc (NIH): 66 mg/dL (ref 0–99)
Triglycerides: 155 mg/dL — ABNORMAL HIGH (ref 0–149)
VLDL Cholesterol Cal: 27 mg/dL (ref 5–40)

## 2023-08-02 LAB — HEMOGLOBIN A1C
Est. average glucose Bld gHb Est-mCnc: 131 mg/dL
Hgb A1c MFr Bld: 6.2 % — ABNORMAL HIGH (ref 4.8–5.6)

## 2023-08-03 ENCOUNTER — Ambulatory Visit: Payer: Self-pay | Admitting: Family Medicine

## 2023-08-04 ENCOUNTER — Other Ambulatory Visit: Payer: Self-pay | Admitting: Urology

## 2023-08-08 DIAGNOSIS — D235 Other benign neoplasm of skin of trunk: Secondary | ICD-10-CM | POA: Diagnosis not present

## 2023-08-08 DIAGNOSIS — D225 Melanocytic nevi of trunk: Secondary | ICD-10-CM | POA: Diagnosis not present

## 2023-08-08 DIAGNOSIS — D2261 Melanocytic nevi of right upper limb, including shoulder: Secondary | ICD-10-CM | POA: Diagnosis not present

## 2023-08-08 DIAGNOSIS — D2262 Melanocytic nevi of left upper limb, including shoulder: Secondary | ICD-10-CM | POA: Diagnosis not present

## 2023-08-08 DIAGNOSIS — D485 Neoplasm of uncertain behavior of skin: Secondary | ICD-10-CM | POA: Diagnosis not present

## 2023-08-08 DIAGNOSIS — Z85820 Personal history of malignant melanoma of skin: Secondary | ICD-10-CM | POA: Diagnosis not present

## 2023-08-08 DIAGNOSIS — D2272 Melanocytic nevi of left lower limb, including hip: Secondary | ICD-10-CM | POA: Diagnosis not present

## 2023-08-14 DIAGNOSIS — Z1211 Encounter for screening for malignant neoplasm of colon: Secondary | ICD-10-CM | POA: Diagnosis not present

## 2023-08-19 LAB — COLOGUARD: COLOGUARD: NEGATIVE

## 2023-09-15 ENCOUNTER — Other Ambulatory Visit: Payer: Self-pay

## 2023-09-15 DIAGNOSIS — R339 Retention of urine, unspecified: Secondary | ICD-10-CM

## 2023-09-15 DIAGNOSIS — R972 Elevated prostate specific antigen [PSA]: Secondary | ICD-10-CM

## 2023-09-15 DIAGNOSIS — N1330 Unspecified hydronephrosis: Secondary | ICD-10-CM | POA: Diagnosis not present

## 2023-09-16 LAB — BASIC METABOLIC PANEL WITH GFR
BUN/Creatinine Ratio: 15 (ref 10–24)
BUN: 21 mg/dL (ref 8–27)
CO2: 21 mmol/L (ref 20–29)
Calcium: 9.2 mg/dL (ref 8.6–10.2)
Chloride: 104 mmol/L (ref 96–106)
Creatinine, Ser: 1.37 mg/dL — ABNORMAL HIGH (ref 0.76–1.27)
Glucose: 117 mg/dL — ABNORMAL HIGH (ref 70–99)
Potassium: 5.1 mmol/L (ref 3.5–5.2)
Sodium: 140 mmol/L (ref 134–144)
eGFR: 55 mL/min/1.73 — ABNORMAL LOW (ref >59)

## 2023-09-16 LAB — PSA: Prostate Specific Ag, Serum: 3.4 ng/mL (ref 0.0–4.0)

## 2023-09-19 NOTE — Progress Notes (Unsigned)
 09/20/2023 8:06 AM   Kevin Melton November 01, 1950 [id number]  Referring provider: Myrla Jon HERO, MD [address] [place],  [place] [address]  Urological history: 1.  Left hydronephrosis -Renal lasix  scan (08/2022) - mild obstruction at the left UVJ; renal function is diminished, which according to the patient is long standing  2.  Hypotonic bladder -CIC x 4 w/ Coude straight caths -not a candidate for Interstim   3.  Nephrolithiasis -right URS/LL/stent (03/2022)  No chief complaint on file.  HPI: Kevin Melton is a 73 y.o. man who presents today for 12 month follow up.   Previous records reviewed.   UA ***  PSA (09/2023) 3.4  Serum creatinine (09/2023) 1.37  Hemoglobin A1c (07/2023) 6.2  PMH: Past Medical History:  Diagnosis Date   Acontractile bladder 06/17/2021   a.) secondary to BPH/detrusor hypotonicity; b.) requires self CIC 4x/day   Aortic atherosclerosis (HCC)    Atherosclerotic heart disease of native coronary artery with unstable angina pectoris (HCC)    BPH (benign prostatic hyperplasia)    Bradycardia    Cataract    Chronic kidney disease, stage III (moderate) (HCC)    Coronary artery disease    a. 2013 Inf MI/PCI: BMS to RCA; b. 11/2013 MV: small reversible apical anteroseptal defect/ischemia. EF 60%; c. 11/2020 MV: small, fixed basal inf defect - scar vs artifact. No ischemia. EF 45-50% (60-65% by echo).   Diastolic dysfunction    a.) TTE 11/19/2020: EF 60-65%, mild LVH, mild MR/AR, G1DD   Dyshidrotic eczema    GERD (gastroesophageal reflux disease)    Gout    Grade I diastolic dysfunction    Hepatic cyst    History of heart artery stent    History of umbilical hernia repair    Hyperlipidemia    Hypertension    Inferior MI (HCC) 04/2011   a.) MI 04/2011 --> treated in [place] --> BMS (unknown type) to RCA   Kidney stones    Mild aortic regurgitation    Mild mitral regurgitation by prior echocardiogram     Motion sickness    Boats and circular motion   Post herpetic neuralgia    Prediabetes    RBBB    Sigmoid diverticulosis    Skin cancer    Wears dentures    Has full upper and lower.  Only wears upper    Surgical History: Past Surgical History:  Procedure Laterality Date   CATARACT EXTRACTION W/PHACO Right 03/02/2023   Procedure: CATARACT EXTRACTION PHACO AND INTRAOCULAR LENS PLACEMENT (IOC) RIGHT Kevin Melton  9.37 00:55.6;  Surgeon: Kevin Feliciano Hugger, MD;  Location: [facility];  Service: Ophthalmology;  Laterality: Right;   CATARACT EXTRACTION W/PHACO Left 03/16/2023   Procedure: CATARACT EXTRACTION PHACO AND INTRAOCULAR LENS PLACEMENT (IOC) LEFT Kevin Melton ;  Surgeon: Kevin Feliciano Hugger, MD;  Location: [facility];  Service: Ophthalmology;  Laterality: Left;  7.84 0:45.6   COLONOSCOPY  2013   CORONARY ANGIOPLASTY WITH STENT PLACEMENT  2013   [place]   CYSTOSCOPY/URETEROSCOPY/HOLMIUM LASER/STENT PLACEMENT Right 03/29/2022   Procedure: CYSTOSCOPY/URETEROSCOPY/HOLMIUM LASER/STENT PLACEMENT/BILATERAL RETROGRADE [id number];  Surgeon: Kevin Glendia BROCKS, MD;  Location: [facility];  Service: Urology;  Laterality: Right;   ESOPHAGOGASTRODUODENOSCOPY N/A 12/08/2021   Procedure: ESOPHAGOGASTRODUODENOSCOPY (EGD);  Surgeon: Toledo, Ladell POUR, MD;  Location: [facility];  Service: Gastroenterology;  Laterality: N/A;   ESOPHAGOGASTRODUODENOSCOPY (EGD) WITH PROPOFOL  N/A 01/05/2017   Procedure: ESOPHAGOGASTRODUODENOSCOPY (EGD) WITH PROPOFOL  With Dilation;  Surgeon: Kevin Corinn Skiff, MD;  Location: [facility];  Service: Gastroenterology;  Laterality: N/A;   ESOPHAGOGASTRODUODENOSCOPY (EGD) WITH PROPOFOL  N/A 07/03/2019   Procedure: ESOPHAGOGASTRODUODENOSCOPY (EGD) WITH PROPOFOL ;  Surgeon: Kevin Corinn Skiff, MD;  Location: [facility];  Service: Gastroenterology;  Laterality: N/A;   ESOPHAGOGASTRODUODENOSCOPY (EGD) WITH PROPOFOL  N/A 11/04/2019   Procedure:  ESOPHAGOGASTRODUODENOSCOPY (EGD) WITH PROPOFOL ;  Surgeon: Kevin Corinn Skiff, MD;  Location: [facility];  Service: Gastroenterology;  Laterality: N/A;   HERNIA REPAIR     LITHOTRIPSY  2014   UMBILICAL HERNIA REPAIR N/A 02/20/2017   Primary repair 2 cm umbilical hernia;  Surgeon: Kevin Reyes ORN, MD;  Location: [facility];  Service: General;  Laterality: N/A;   URETEROSCOPY Left 03/29/2022   Procedure: DIAGNOSTIC URETEROSCOPY/LEFT STENT PLACEMENT;  Surgeon: Kevin Glendia BROCKS, MD;  Location: [facility];  Service: Urology;  Laterality: Left;    Home Medications:  Allergies as of 09/20/2023   No Known Allergies      Medication List        Accurate as of September 19, 2023  8:06 AM. If you have any questions, ask your nurse or doctor.          allopurinol  300 MG tablet Commonly known as: ZYLOPRIM  TAKE 1 TABLET BY MOUTH EVERYDAY AT BEDTIME   amLODipine  10 MG tablet Commonly known as: NORVASC  Take 1 tablet (10 mg total) by mouth daily.   aspirin  81 MG chewable tablet Chew 1 tablet (81 mg total) by mouth at bedtime.   atorvastatin  40 MG tablet Commonly known as: LIPITOR Take 1 tablet (40 mg total) by mouth daily.   benazepril  40 MG tablet Commonly known as: LOTENSIN  Take 1 tablet (40 mg total) by mouth daily.   finasteride  5 MG tablet Commonly known as: PROSCAR  TAKE 1 TABLET (5 MG TOTAL) BY MOUTH DAILY.   nitroGLYCERIN  0.4 MG SL tablet Commonly known as: NITROSTAT  Place 1 tablet (0.4 mg total) under the tongue every 5 (five) minutes as needed for chest pain.   oxybutynin  5 MG tablet Commonly known as: DITROPAN  Take 1 tablet (5 mg total) by mouth every 8 (eight) hours as needed for bladder spasms.        Allergies: No Known Allergies  Family History: Family History  Problem Relation Age of Onset   Dementia Mother 108   Lung disease Father 79       collapsed lung   Healthy Sister    Healthy Brother    Heart Problems Brother    Healthy Son    Healthy Sister     Colon cancer Neg Hx    Prostate cancer Neg Hx    Heart disease Neg Hx     Social History:  reports that he has never smoked. He has never used smokeless tobacco. He reports that he does not drink alcohol and does not use drugs.  ROS: Pertinent ROS in HPI  Physical Exam: There were no vitals taken for this visit.  Constitutional:  Well nourished. Alert and oriented, No acute distress. HEENT: Leslie AT, moist mucus membranes.  Trachea midline, no masses. Cardiovascular: No clubbing, cyanosis, or edema. Respiratory: Normal respiratory effort, no increased work of breathing. GI: Abdomen is soft, non tender, non distended, no abdominal masses. Liver and spleen not palpable.  No hernias appreciated.  Stool sample for occult testing is not indicated.   GU: No CVA tenderness.  No bladder fullness or masses.  Patient with circumcised/uncircumcised phallus. ***Foreskin easily retracted***  Urethral meatus is patent.  No penile discharge. No penile lesions or rashes. Scrotum  without lesions, cysts, rashes and/or edema.  Testicles are located scrotally bilaterally. No masses are appreciated in the testicles. Left and right epididymis are normal. Rectal: Patient with  normal sphincter tone. Anus and perineum without scarring or rashes. No rectal masses are appreciated. Prostate is approximately *** grams, *** nodules are appreciated. Seminal vesicles are normal. Skin: No rashes, bruises or suspicious lesions. Lymph: No cervical or inguinal adenopathy. Neurologic: Grossly intact, no focal deficits, moving all 4 extremities. Psychiatric: Normal mood and affect.   Laboratory Data: See EPIC and HPI I have reviewed the labs.   Pertinent Imaging: N/A  Assessment & Plan:    1.  Left hydronephrosis -Considered by the patient to be longstanding -  2. Hypotonic bladder -continue CIC x 4 with coude catheters indefinitely - Not a candidate for InterStim  3. BPH with LU TS - stable, improving, worsening  mild, moderate severe symptoms *** - no signs of retention, infection or malignancy *** - PSA up to date *** - DRE benign *** - UA benign *** - PVR < 300 cc *** - most bothersome symptoms are *** - encouraged avoiding bladder irritants, fluid restriction before bedtime and timed voiding's - Initiate alpha-blocker (***), discussed side effects *** - Initiate 5 alpha reductase inhibitor (***), discussed side effects *** - Continue tamsulosin 0.4 mg daily, alfuzosin 10 mg daily, Rapaflo  8 mg daily, terazosin, doxazosin, Cialis 5 mg daily and finasteride  5 mg daily, dutasteride 0.5 mg daily***:refills given - Cannot tolerate medication or medication failure, schedule cystoscopy *** - educated on red flag symptoms: acute retention, gross hematuria, fever, severe pain - advised to call clinic or go to the ED if these occur - return to clinic in *** symptom re-evaluation ***        No follow-ups on file.  These notes generated with voice recognition software. I apologize for typographical errors.  CLOTILDA HELON RIGGERS  [facility] [address]  [address] [place], [place] [address] [phone]

## 2023-09-20 ENCOUNTER — Encounter: Payer: Self-pay | Admitting: Urology

## 2023-09-20 ENCOUNTER — Ambulatory Visit
Admission: RE | Admit: 2023-09-20 | Discharge: 2023-09-20 | Disposition: A | Discharge status: Home / Self Care | Source: Ambulatory Visit | Attending: Urology | Admitting: Urology

## 2023-09-20 ENCOUNTER — Ambulatory Visit: Payer: Self-pay | Admitting: Urology

## 2023-09-20 ENCOUNTER — Ambulatory Visit
Admission: RE | Admit: 2023-09-20 | Discharge: 2023-09-20 | Disposition: A | Discharge status: Home / Self Care | Source: Ambulatory Visit | Attending: Urology

## 2023-09-20 VITALS — BP 128/65 | HR 73 | Ht 69.0 in | Wt 198.0 lb

## 2023-09-20 DIAGNOSIS — N1330 Unspecified hydronephrosis: Secondary | ICD-10-CM

## 2023-09-20 DIAGNOSIS — N200 Calculus of kidney: Secondary | ICD-10-CM | POA: Insufficient documentation

## 2023-09-20 DIAGNOSIS — N138 Other obstructive and reflux uropathy: Secondary | ICD-10-CM

## 2023-09-20 DIAGNOSIS — R339 Retention of urine, unspecified: Secondary | ICD-10-CM

## 2023-09-20 DIAGNOSIS — R109 Unspecified abdominal pain: Secondary | ICD-10-CM | POA: Diagnosis not present

## 2023-09-20 DIAGNOSIS — N401 Enlarged prostate with lower urinary tract symptoms: Secondary | ICD-10-CM

## 2023-09-20 LAB — URINALYSIS, COMPLETE
Bilirubin, UA: NEGATIVE
Glucose, UA: NEGATIVE
Ketones, UA: NEGATIVE
Nitrite, UA: POSITIVE — AB
Protein,UA: NEGATIVE
Specific Gravity, UA: 1.02 (ref 1.005–1.030)
Urobilinogen, Ur: 0.2 mg/dL (ref 0.2–1.0)
pH, UA: 6 (ref 5.0–7.5)

## 2023-09-20 LAB — MICROSCOPIC EXAMINATION: WBC, UA: >30 /HPF — AB (ref 0–5)

## 2023-09-27 ENCOUNTER — Ambulatory Visit: Payer: Self-pay | Admitting: Urology

## 2023-09-27 LAB — CULTURE, URINE COMPREHENSIVE

## 2023-10-01 ENCOUNTER — Other Ambulatory Visit: Payer: Self-pay | Admitting: Family Medicine

## 2023-10-08 ENCOUNTER — Other Ambulatory Visit: Payer: Self-pay | Admitting: Family Medicine

## 2023-10-18 DIAGNOSIS — R339 Retention of urine, unspecified: Secondary | ICD-10-CM | POA: Diagnosis not present

## 2023-11-03 ENCOUNTER — Other Ambulatory Visit: Payer: Self-pay | Admitting: Medical

## 2023-12-08 ENCOUNTER — Ambulatory Visit: Attending: Nurse Practitioner | Admitting: Nurse Practitioner

## 2023-12-08 ENCOUNTER — Other Ambulatory Visit: Payer: Self-pay | Admitting: Medical

## 2023-12-08 ENCOUNTER — Encounter: Payer: Self-pay | Admitting: Nurse Practitioner

## 2023-12-08 VITALS — BP 122/54 | HR 53 | Ht 69.0 in | Wt 199.5 lb

## 2023-12-08 DIAGNOSIS — I2510 Atherosclerotic heart disease of native coronary artery without angina pectoris: Secondary | ICD-10-CM

## 2023-12-08 DIAGNOSIS — I10 Essential (primary) hypertension: Secondary | ICD-10-CM | POA: Diagnosis not present

## 2023-12-08 DIAGNOSIS — N1831 Chronic kidney disease, stage 3a: Secondary | ICD-10-CM

## 2023-12-08 DIAGNOSIS — E119 Type 2 diabetes mellitus without complications: Secondary | ICD-10-CM | POA: Diagnosis not present

## 2023-12-08 DIAGNOSIS — E785 Hyperlipidemia, unspecified: Secondary | ICD-10-CM | POA: Diagnosis not present

## 2023-12-08 MED ORDER — NITROGLYCERIN 0.4 MG SL SUBL: 0.4000 mg | SUBLINGUAL_TABLET | SUBLINGUAL | 0 refills | Status: AC | PRN

## 2023-12-08 NOTE — Patient Instructions (Addendum)
 Medication Instructions:  Your physician recommends that you continue on your current medications as directed. Please refer to the Current Medication list given to you today.   *If you need a refill on your cardiac medications before your next appointment, please call your pharmacy*  Lab Work: No labs ordered today  If you have labs (blood work) drawn today and your tests are completely normal, you will receive your results only by: MyChart Message (if you have MyChart) OR A paper copy in the mail If you have any lab test that is abnormal or we need to change your treatment, we will call you to review the results.  Testing/Procedures: No test ordered today   Follow-Up: At [facility], you and your health needs are our priority.  As part of our continuing mission to provide you with exceptional heart care, our providers are all part of one team.  This team includes your primary Cardiologist (physician) and Advanced Practice Providers or APPs (Physician Assistants and Nurse Practitioners) who all work together to provide you with the care you need, when you need it.  Your next appointment:   1 year(s)  Provider:   You may see Lonni Hanson, MD

## 2023-12-08 NOTE — Progress Notes (Signed)
 Office Visit    Patient Name: Kevin Melton Date of Encounter: 12/08/2023  Primary Care Provider:  Myrla Jon HERO, MD Primary Cardiologist:  Lonni Hanson, MD    Chief Complaint    73 y.o. male with a history of CAD, hypertension, hyperlipidemia, stage III chronic kidney disease, prediabetes, bradycardia, nephrolithiasis, and right bundle branch block, presents for CAD follow-up.   Past Medical History   Subjective   Past Medical History:  Diagnosis Date   Acontractile bladder 06/17/2021   a.) secondary to BPH/detrusor hypotonicity; b.) requires self CIC 4x/day   Aortic atherosclerosis    Atherosclerotic heart disease of native coronary artery with unstable angina pectoris (HCC)    BPH (benign prostatic hyperplasia)    Bradycardia    Cataract    Chronic kidney disease, stage III (moderate) (HCC)    Coronary artery disease    a. 2013 Inf MI/PCI: BMS to RCA; b. 11/2013 MV: small reversible apical anteroseptal defect/ischemia. EF 60%; c. 11/2020 MV: small, fixed basal inf defect - scar vs artifact. No ischemia. EF 45-50% (60-65% by echo).   Diastolic dysfunction    a.) TTE 11/19/2020: EF 60-65%, mild LVH, mild MR/AR, G1DD   Dyshidrotic eczema    GERD (gastroesophageal reflux disease)    Gout    Grade I diastolic dysfunction    Hepatic cyst    History of heart artery stent    History of umbilical hernia repair    Hyperlipidemia    Hypertension    Inferior MI (HCC) 04/2011   a.) MI 04/2011 --> treated in [place] --> BMS (unknown type) to RCA   Kidney stones    Mild aortic regurgitation    Mild mitral regurgitation by prior echocardiogram    Motion sickness    Boats and circular motion   Post herpetic neuralgia    Prediabetes    RBBB    Sigmoid diverticulosis    Skin cancer    Wears dentures    Has full upper and lower.  Only wears upper   Past Surgical History:  Procedure Laterality Date   CATARACT EXTRACTION W/PHACO Right 03/02/2023    Procedure: CATARACT EXTRACTION PHACO AND INTRAOCULAR LENS PLACEMENT (IOC) RIGHT MALYUGIN OMIDRIA  9.37 00:55.6;  Surgeon: Enola Feliciano Hugger, MD;  Location: [facility];  Service: Ophthalmology;  Laterality: Right;   CATARACT EXTRACTION W/PHACO Left 03/16/2023   Procedure: CATARACT EXTRACTION PHACO AND INTRAOCULAR LENS PLACEMENT (IOC) LEFT MALYUGIN OMIDRIA ;  Surgeon: Enola Feliciano Hugger, MD;  Location: [facility];  Service: Ophthalmology;  Laterality: Left;  7.84 0:45.6   COLONOSCOPY  2013   CORONARY ANGIOPLASTY WITH STENT PLACEMENT  2013   [place]   CYSTOSCOPY/URETEROSCOPY/HOLMIUM LASER/STENT PLACEMENT Right 03/29/2022   Procedure: CYSTOSCOPY/URETEROSCOPY/HOLMIUM LASER/STENT PLACEMENT/BILATERAL RETROGRADE [id number];  Surgeon: Twylla Glendia BROCKS, MD;  Location: [facility];  Service: Urology;  Laterality: Right;   ESOPHAGOGASTRODUODENOSCOPY N/A 12/08/2021   Procedure: ESOPHAGOGASTRODUODENOSCOPY (EGD);  Surgeon: Toledo, Ladell POUR, MD;  Location: [facility];  Service: Gastroenterology;  Laterality: N/A;   ESOPHAGOGASTRODUODENOSCOPY (EGD) WITH PROPOFOL  N/A 01/05/2017   Procedure: ESOPHAGOGASTRODUODENOSCOPY (EGD) WITH PROPOFOL  With Dilation;  Surgeon: Unk Corinn Skiff, MD;  Location: [facility];  Service: Gastroenterology;  Laterality: N/A;   ESOPHAGOGASTRODUODENOSCOPY (EGD) WITH PROPOFOL  N/A 07/03/2019   Procedure: ESOPHAGOGASTRODUODENOSCOPY (EGD) WITH PROPOFOL ;  Surgeon: Unk Corinn Skiff, MD;  Location: [facility];  Service: Gastroenterology;  Laterality: N/A;   ESOPHAGOGASTRODUODENOSCOPY (EGD) WITH PROPOFOL  N/A 11/04/2019   Procedure: ESOPHAGOGASTRODUODENOSCOPY (EGD) WITH PROPOFOL ;  Surgeon: Unk Corinn Skiff, MD;  Location: [facility];  Service: Gastroenterology;  Laterality: N/A;   HERNIA REPAIR     LITHOTRIPSY  2014   UMBILICAL HERNIA REPAIR N/A 02/20/2017   Primary repair 2 cm umbilical hernia;  Surgeon: Dessa Reyes ORN, MD;  Location: [facility];   Service: General;  Laterality: N/A;   URETEROSCOPY Left 03/29/2022   Procedure: DIAGNOSTIC URETEROSCOPY/LEFT STENT PLACEMENT;  Surgeon: Twylla Glendia BROCKS, MD;  Location: [facility];  Service: Urology;  Laterality: Left;    Allergies  No Known Allergies     History of Present Illness      73 y.o. y/o male with a history of CAD, hypertension, hyperlipidemia, stage III chronic kidney disease, prediabetes, bradycardia, nephrolithiasis, and right bundle branch block.  He previously suffered an inferior myocardial infarction in [place], [place]  in February 2013, requiring bare-metal stenting of the right coronary artery.  Subsequent stress tests in September 2015 and again in September 2022, were low risk with the most recent showing a small, fixed basal inferior defect without ischemia.  Most recent echo in September 2022 showed an EF of 60 to 65% with mild LVH, mild MR/AI, and grade 1 diastolic dysfunction.    [name] was last seen in cardiology clinic in October 2024, at which time he was doing well and in the process of losing weight.  He had cataract surgery in January 2025.  He continues to follow with urology in the setting of nephrolithiasis and hypotonic bladder requiring self-catheterization every 6 hours.  From a cardiac standpoint, he continues to do well.  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  He does not routinely exercise.  He denies chest pain, dyspnea, palpitations, PND, orthopnea, dizziness, syncope, edema, or early satiety.  Objective   Home Medications    Current Outpatient Medications  Medication Sig Dispense Refill   allopurinol  (ZYLOPRIM ) 300 MG tablet TAKE 1 TABLET BY MOUTH EVERYDAY AT BEDTIME 90 tablet 1   amLODipine  (NORVASC ) 10 MG tablet TAKE 1 TABLET BY MOUTH EVERY DAY 90 tablet 0   aspirin  81 MG chewable tablet Chew 1 tablet (81 mg total) by mouth at bedtime. 90 tablet 3    atorvastatin  (LIPITOR) 40 MG tablet Take 1 tablet (40 mg total) by mouth daily. 90 tablet 3   benazepril  (LOTENSIN ) 40 MG tablet TAKE 1 TABLET BY MOUTH EVERY DAY 90 tablet 0   finasteride  (PROSCAR ) 5 MG tablet TAKE 1 TABLET (5 MG TOTAL) BY MOUTH DAILY. 90 tablet 3   nitroGLYCERIN  (NITROSTAT ) 0.4 MG SL tablet Place 1 tablet (0.4 mg total) under the tongue every 5 (five) minutes as needed for chest pain. 25 tablet 0   oxybutynin  (DITROPAN ) 5 MG tablet Take 1 tablet (5 mg total) by mouth every 8 (eight) hours as needed for bladder spasms. 30 tablet 2   No current facility-administered medications for this visit.     Physical Exam    VS:  BP (!) 122/54 (BP Location: Left Arm, Patient Position: Sitting, Cuff Size: Normal)   Pulse (!) 53   Ht 5' 9 (1.753 m)   Wt 199 lb 8 oz (90.5 kg)   SpO2 95%   BMI 29.46 kg/m  , BMI Body mass index is 29.46 kg/m.          GEN: Well nourished, well developed, in no acute distress. HEENT: normal. Neck: Supple, no JVD, carotid bruits, or masses. Cardiac: RRR, no murmurs,  rubs, or gallops. No clubbing, cyanosis, edema.  Radials 2+/PT 2+ and equal bilaterally.  Respiratory:  Respirations regular and unlabored, clear to auscultation bilaterally. GI: Soft, nontender, nondistended, BS + x 4. MS: no deformity or atrophy. Skin: warm and dry, no rash. Neuro:  Strength and sensation are intact. Psych: Normal affect.  Accessory Clinical Findings    ECG personally reviewed by me today - EKG Interpretation Date/Time:  Friday December 08 2023 08:05:12 EDT Ventricular Rate:  53 PR Interval:  160 QRS Duration:  148 QT Interval:  472 QTC Calculation: 442 R Axis:   22  Text Interpretation: Sinus bradycardia Right bundle branch block Confirmed by Vivienne Bruckner [phone]) on 12/08/2023 8:16:11 AM  - no acute changes.  Lab Results  Component Value Date   WBC 8.8 09/01/2022   HGB 13.7 09/01/2022   HCT 41.7 09/01/2022   MCV 91 09/01/2022   PLT 285  09/01/2022   Lab Results  Component Value Date   CREATININE 1.37 (H) 09/15/2023   BUN 21 09/15/2023   NA 140 09/15/2023   K 5.1 09/15/2023   CL 104 09/15/2023   CO2 21 09/15/2023   Lab Results  Component Value Date   ALT 22 08/01/2023   AST 21 08/01/2023   ALKPHOS 70 08/01/2023   BILITOT 0.4 08/01/2023   Lab Results  Component Value Date   CHOL 129 08/01/2023   HDL 36 (L) 08/01/2023   LDLCALC 66 08/01/2023   TRIG 155 (H) 08/01/2023   CHOLHDL 3.6 08/01/2023    Lab Results  Component Value Date   HGBA1C 6.2 (H) 08/01/2023   Lab Results  Component Value Date   TSH 2.518 11/03/2021       Assessment & Plan    1.  CAD: Status post inferior MI and bare-metal stent to the right coronary artery in 2013.  He has been doing well without chest pain or dyspnea.  He is active but does not routinely exercise.  Continue aspirin , statin, and calcium  channel blocker therapy.  2.  Primary hypertension: Stable on on amlodipine  and benazepril .  3.  Hyperlipidemia: LDL of 66 in May 2025 with normal LFTs at that time.  He remains on statin therapy.  4.  Stage III chronic kidney disease: Creatinine relatively stable at 1.37 over the summer.  Continue ACE inhibitor.  5.  Type 2 diabetes mellitus: A1c 6.2 in May.  He is diet controlled.  6.  Hypotonic bladder/BPH: Followed closely by urology on oxybutynin  and finasteride .  Continues to self catheterize every 6 hours.  7.  Diposition: Follow-up in 1 year or sooner if necessary.  Refilling sublingual nitroglycerin  today.   Bruckner Vivienne, NP 12/08/2023, 8:17 AM

## 2023-12-19 ENCOUNTER — Other Ambulatory Visit

## 2023-12-19 DIAGNOSIS — N1330 Unspecified hydronephrosis: Secondary | ICD-10-CM

## 2023-12-20 LAB — BASIC METABOLIC PANEL WITH GFR
BUN/Creatinine Ratio: 16 (ref 10–24)
BUN: 22 mg/dL (ref 8–27)
CO2: 20 mmol/L (ref 20–29)
Calcium: 9.3 mg/dL (ref 8.6–10.2)
Chloride: 103 mmol/L (ref 96–106)
Creatinine, Ser: 1.34 mg/dL — ABNORMAL HIGH (ref 0.76–1.27)
Glucose: 128 mg/dL — ABNORMAL HIGH (ref 70–99)
Potassium: 5.2 mmol/L (ref 3.5–5.2)
Sodium: 140 mmol/L (ref 134–144)
eGFR: 56 mL/min/1.73 — ABNORMAL LOW (ref >59)

## 2024-01-06 ENCOUNTER — Other Ambulatory Visit: Payer: Self-pay | Admitting: Family Medicine

## 2024-01-09 DIAGNOSIS — H903 Sensorineural hearing loss, bilateral: Secondary | ICD-10-CM | POA: Diagnosis not present

## 2024-01-09 NOTE — Telephone Encounter (Signed)
 Requested Prescriptions  Pending Prescriptions Disp Refills   benazepril  (LOTENSIN ) 40 MG tablet [Pharmacy Med Name: BENAZEPRIL  HCL 40 MG TABLET] 90 tablet 0    Sig: TAKE 1 TABLET BY MOUTH EVERY DAY     Cardiovascular:  ACE Inhibitors Failed - 01/09/2024 11:30 AM      Failed - Cr in normal range and within 180 days    Creat  Date Value Ref Range Status  12/13/2016 1.36 (H) 0.70 - 1.25 mg/dL Final    Comment:    For patients >72 years of age, the reference limit for Creatinine is approximately 13% higher for people identified as African-American. .    Creatinine, Ser  Date Value Ref Range Status  12/19/2023 1.34 (H) 0.76 - 1.27 mg/dL Final         Passed - K in normal range and within 180 days    Potassium  Date Value Ref Range Status  12/19/2023 5.2 3.5 - 5.2 mmol/L Final         Passed - Patient is not pregnant      Passed - Last BP in normal range    BP Readings from Last 1 Encounters:  12/08/23 (!) 122/54         Passed - Valid encounter within last 6 months    Recent Outpatient Visits           5 months ago Encounter for annual physical exam   [facility], Jon HERO, MD

## 2024-01-12 DIAGNOSIS — R339 Retention of urine, unspecified: Secondary | ICD-10-CM | POA: Diagnosis not present

## 2024-01-23 ENCOUNTER — Ambulatory Visit (INDEPENDENT_AMBULATORY_CARE_PROVIDER_SITE_OTHER): Admitting: Family Medicine

## 2024-01-23 ENCOUNTER — Encounter: Payer: Self-pay | Admitting: Family Medicine

## 2024-01-23 VITALS — BP 136/59 | HR 49 | Ht 69.0 in | Wt 200.3 lb

## 2024-01-23 DIAGNOSIS — M1A09X0 Idiopathic chronic gout, multiple sites, without tophus (tophi): Secondary | ICD-10-CM | POA: Diagnosis not present

## 2024-01-23 DIAGNOSIS — E782 Mixed hyperlipidemia: Secondary | ICD-10-CM | POA: Diagnosis not present

## 2024-01-23 DIAGNOSIS — Z23 Encounter for immunization: Secondary | ICD-10-CM | POA: Diagnosis not present

## 2024-01-23 DIAGNOSIS — N1831 Chronic kidney disease, stage 3a: Secondary | ICD-10-CM | POA: Diagnosis not present

## 2024-01-23 DIAGNOSIS — I10 Essential (primary) hypertension: Secondary | ICD-10-CM

## 2024-01-23 DIAGNOSIS — R7303 Prediabetes: Secondary | ICD-10-CM | POA: Diagnosis not present

## 2024-01-23 NOTE — Assessment & Plan Note (Signed)
 Hypertension is managed with current medications.

## 2024-01-23 NOTE — Assessment & Plan Note (Signed)
 Well-controlled with atorvastatin . Last cholesterol check in May showed good control. - Continue atorvastatin  40 mg daily

## 2024-01-23 NOTE — Assessment & Plan Note (Signed)
 Chronic kidney disease stage 3a with elevated creatinine levels, likely due to ureteral stricture and hydronephrosis. No stent currently. Elevated creatinine may be related to the obstruction. - Ordered BMP to assess kidney function - Provided urine test kit for microscopic protein analysis - Will coordinate with urologist if creatinine levels remain elevated

## 2024-01-23 NOTE — Progress Notes (Signed)
 Pharmacy Quality Measure Review  This patient is appearing on a report for being at risk of failing the adherence measure for hypertension (ACEi/ARB) medications this calendar year.   Medication: benazepril  Last fill date: 01/09/24 for 90 day supply  Insurance report was not up to date. No action needed at this time.   Metzli Pollick E. Marsh, PharmD, CPP Clinical Pharmacist [facility] [phone]

## 2024-01-23 NOTE — Assessment & Plan Note (Signed)
 Last A1c was checked in May. - Ordered A1c test

## 2024-01-23 NOTE — Progress Notes (Signed)
 Established patient visit   Patient: Kevin Melton   DOB: January 16, 1951   73 y.o. Male  MRN: [id number] Visit Date: 01/23/2024  Today's healthcare provider: Jon Eva, MD   Chief Complaint  Patient presents with   Medical Management of Chronic Issues    He reports taking medications as prescribed with no symptoms to report. He does not smoke. He does not take anything that could affect his blood pressure. He does not monitor at home   Hyperlipidemia   Hypertension   Subjective    HPI HPI     Medical Management of Chronic Issues    Additional comments: He reports taking medications as prescribed with no symptoms to report. He does not smoke. He does not take anything that could affect his blood pressure. He does not monitor at home      Last edited by Lilian Fitzpatrick, CMA on 01/23/2024  8:39 AM.       Discussed the use of AI scribe software for clinical note transcription with the patient, who gave verbal consent to proceed.  History of Present Illness   Kevin Melton is a 73 year old male with hypertension, hyperlipidemia, CKD 3A, chronic gout, and prediabetes who presents for chronic disease follow-up.  He experiences discomfort from a scarred ureter after six to seven hours of sleep, requiring self-catheterization. Creatinine levels were elevated in the last blood test, possibly due to ureteral obstruction. He has discussed stenting with his urologist, considering the need for frequent replacements.  He has a history of kidney stones, with significant episodes in 2000 and seven to eight years ago, causing severe pain. He is concerned about future stone formation due to kidney issues.  He takes amlodipine  10 mg daily, allopurinol  300 mg daily, baby aspirin , Lipitor 40 mg daily, and benazepril  40 mg daily. Allopurinol  has effectively managed gout and reduced kidney stone frequency, with no episodes in the past eight years.  His last A1c and  cholesterol tests in May showed well-controlled cholesterol levels. He is due for a recheck of kidney function, as previous tests in July and October showed elevated creatinine levels.       Medications: Outpatient Medications Prior to Visit  Medication Sig   allopurinol  (ZYLOPRIM ) 300 MG tablet TAKE 1 TABLET BY MOUTH EVERYDAY AT BEDTIME   amLODipine  (NORVASC ) 10 MG tablet TAKE 1 TABLET BY MOUTH EVERY DAY   aspirin  81 MG chewable tablet Chew 1 tablet (81 mg total) by mouth at bedtime.   atorvastatin  (LIPITOR) 40 MG tablet TAKE 1 TABLET BY MOUTH EVERY DAY   benazepril  (LOTENSIN ) 40 MG tablet TAKE 1 TABLET BY MOUTH EVERY DAY   finasteride  (PROSCAR ) 5 MG tablet TAKE 1 TABLET (5 MG TOTAL) BY MOUTH DAILY.   nitroGLYCERIN  (NITROSTAT ) 0.4 MG SL tablet Place 1 tablet (0.4 mg total) under the tongue every 5 (five) minutes as needed for chest pain.   oxybutynin  (DITROPAN ) 5 MG tablet Take 1 tablet (5 mg total) by mouth every 8 (eight) hours as needed for bladder spasms.   No facility-administered medications prior to visit.    Review of Systems     Objective    BP (!) 136/59 (BP Location: Left Arm, Patient Position: Sitting, Cuff Size: Large)   Pulse (!) 49   Ht 5' 9 (1.753 m)   Wt 200 lb 4.8 oz (90.9 kg)   SpO2 98%   BMI 29.58 kg/m    Physical Exam   No results found for  any visits on 01/23/24.  Assessment & Plan     Problem List Items Addressed This Visit       Cardiovascular and Mediastinum   Essential hypertension with goal blood pressure less than 130/80 - Primary   Hypertension is managed with current medications.      Relevant Orders   Basic Metabolic Panel (BMET)   Urine Microalbumin w/creat. ratio     Genitourinary   CKD stage 3a, GFR 45-59 ml/min (HCC)   Chronic kidney disease stage 3a with elevated creatinine levels, likely due to ureteral stricture and hydronephrosis. No stent currently. Elevated creatinine may be related to the obstruction. - Ordered BMP to  assess kidney function - Provided urine test kit for microscopic protein analysis - Will coordinate with urologist if creatinine levels remain elevated      Relevant Orders   Basic Metabolic Panel (BMET)   Urine Microalbumin w/creat. ratio     Other   Gout   Chronic gout is well-controlled with allopurinol . No recent gout attacks or kidney stones in the past eight years. - Continue allopurinol  300 mg daily      Relevant Orders   Uric acid   Mixed hyperlipidemia   Well-controlled with atorvastatin . Last cholesterol check in May showed good control. - Continue atorvastatin  40 mg daily      Prediabetes   Last A1c was checked in May. - Ordered A1c test      Relevant Orders   Hemoglobin A1c        General Health Maintenance Flu vaccination is due. - Administered flu shot       Return in about 6 months (around 07/22/2024) for CPE.       Jon Eva, MD  [facility] [phone] (phone) [phone] (fax)  [facility]

## 2024-01-23 NOTE — Assessment & Plan Note (Signed)
 Chronic gout is well-controlled with allopurinol . No recent gout attacks or kidney stones in the past eight years. - Continue allopurinol  300 mg daily

## 2024-01-24 ENCOUNTER — Ambulatory Visit: Payer: Self-pay | Admitting: Family Medicine

## 2024-01-24 LAB — BASIC METABOLIC PANEL WITH GFR
BUN/Creatinine Ratio: 17 (ref 10–24)
BUN: 21 mg/dL (ref 8–27)
CO2: 22 mmol/L (ref 20–29)
Calcium: 9.3 mg/dL (ref 8.6–10.2)
Chloride: 106 mmol/L (ref 96–106)
Creatinine, Ser: 1.24 mg/dL (ref 0.76–1.27)
Glucose: 110 mg/dL — ABNORMAL HIGH (ref 70–99)
Potassium: 4.8 mmol/L (ref 3.5–5.2)
Sodium: 143 mmol/L (ref 134–144)
eGFR: 62 mL/min/1.73 (ref >59)

## 2024-01-24 LAB — HEMOGLOBIN A1C
Est. average glucose Bld gHb Est-mCnc: 140 mg/dL
Hgb A1c MFr Bld: 6.5 % — ABNORMAL HIGH (ref 4.8–5.6)

## 2024-01-24 LAB — URIC ACID: Uric Acid: 5.1 mg/dL (ref 3.8–8.4)

## 2024-01-24 LAB — MICROALBUMIN / CREATININE URINE RATIO
Creatinine, Urine: 123.8 mg/dL
Microalb/Creat Ratio: 65 mg/g{creat} — ABNORMAL HIGH (ref 0–29)
Microalbumin, Urine: 80.9 ug/mL

## 2024-01-31 ENCOUNTER — Other Ambulatory Visit: Payer: Self-pay | Admitting: Internal Medicine

## 2024-02-10 ENCOUNTER — Other Ambulatory Visit: Payer: Self-pay | Admitting: Medical

## 2024-02-13 DIAGNOSIS — L538 Other specified erythematous conditions: Secondary | ICD-10-CM | POA: Diagnosis not present

## 2024-02-13 DIAGNOSIS — D225 Melanocytic nevi of trunk: Secondary | ICD-10-CM | POA: Diagnosis not present

## 2024-02-13 DIAGNOSIS — D485 Neoplasm of uncertain behavior of skin: Secondary | ICD-10-CM | POA: Diagnosis not present

## 2024-02-13 DIAGNOSIS — L820 Inflamed seborrheic keratosis: Secondary | ICD-10-CM | POA: Diagnosis not present

## 2024-02-13 DIAGNOSIS — D2261 Melanocytic nevi of right upper limb, including shoulder: Secondary | ICD-10-CM | POA: Diagnosis not present

## 2024-02-13 DIAGNOSIS — D2272 Melanocytic nevi of left lower limb, including hip: Secondary | ICD-10-CM | POA: Diagnosis not present

## 2024-02-13 DIAGNOSIS — D2262 Melanocytic nevi of left upper limb, including shoulder: Secondary | ICD-10-CM | POA: Diagnosis not present

## 2024-02-13 DIAGNOSIS — Z85820 Personal history of malignant melanoma of skin: Secondary | ICD-10-CM | POA: Diagnosis not present

## 2024-02-14 MED ORDER — ASPIRIN 81 MG PO CHEW: 81.0000 mg | CHEWABLE_TABLET | Freq: Every day | ORAL | 3 refills | Status: AC

## 2024-03-26 ENCOUNTER — Other Ambulatory Visit: Payer: Self-pay | Admitting: Family Medicine

## 2024-04-07 ENCOUNTER — Other Ambulatory Visit: Payer: Self-pay | Admitting: Family Medicine

## 2024-04-07 DIAGNOSIS — I10 Essential (primary) hypertension: Secondary | ICD-10-CM

## 2024-04-22 ENCOUNTER — Other Ambulatory Visit

## 2024-04-29 ENCOUNTER — Ambulatory Visit: Admitting: Family Medicine

## 2024-05-14 ENCOUNTER — Ambulatory Visit

## 2024-08-05 ENCOUNTER — Encounter: Admitting: Family Medicine
